# Patient Record
Sex: Female | Born: 1965 | Race: White | Hispanic: No | Smoking: Former smoker
Health system: Southern US, Community
[De-identification: ages and names within clinical notes are randomized; demographics above are authoritative.]

## PROBLEM LIST (undated history)

## (undated) DIAGNOSIS — Z803 Family history of malignant neoplasm of breast: Secondary | ICD-10-CM

## (undated) DIAGNOSIS — I2699 Other pulmonary embolism without acute cor pulmonale: Secondary | ICD-10-CM

## (undated) DIAGNOSIS — C50919 Malignant neoplasm of unspecified site of unspecified female breast: Secondary | ICD-10-CM

## (undated) HISTORY — DX: Malignant neoplasm of unspecified site of unspecified female breast: C50.919

## (undated) HISTORY — DX: Family history of malignant neoplasm of breast: Z80.3

## (undated) HISTORY — DX: Other pulmonary embolism without acute cor pulmonale: I26.99

---

## 1997-09-08 HISTORY — PX: OTHER SURGICAL HISTORY: SHX169

## 1998-07-03 ENCOUNTER — Other Ambulatory Visit: Admission: RE | Admit: 1998-07-03 | Discharge: 1998-07-03 | Payer: Self-pay | Admitting: Obstetrics and Gynecology

## 1999-07-08 ENCOUNTER — Other Ambulatory Visit: Admission: RE | Admit: 1999-07-08 | Discharge: 1999-07-08 | Payer: Self-pay | Admitting: Obstetrics and Gynecology

## 2000-09-21 ENCOUNTER — Other Ambulatory Visit: Admission: RE | Admit: 2000-09-21 | Discharge: 2000-09-21 | Payer: Self-pay | Admitting: Obstetrics and Gynecology

## 2001-09-23 ENCOUNTER — Other Ambulatory Visit: Admission: RE | Admit: 2001-09-23 | Discharge: 2001-09-23 | Payer: Self-pay | Admitting: Obstetrics and Gynecology

## 2002-09-30 ENCOUNTER — Other Ambulatory Visit: Admission: RE | Admit: 2002-09-30 | Discharge: 2002-09-30 | Payer: Self-pay | Admitting: Obstetrics and Gynecology

## 2004-02-02 ENCOUNTER — Other Ambulatory Visit: Admission: RE | Admit: 2004-02-02 | Discharge: 2004-02-02 | Payer: Self-pay | Admitting: Obstetrics and Gynecology

## 2005-02-14 ENCOUNTER — Other Ambulatory Visit: Admission: RE | Admit: 2005-02-14 | Discharge: 2005-02-14 | Payer: Self-pay | Admitting: Obstetrics and Gynecology

## 2006-04-10 ENCOUNTER — Other Ambulatory Visit: Admission: RE | Admit: 2006-04-10 | Discharge: 2006-04-10 | Payer: Self-pay | Admitting: Obstetrics and Gynecology

## 2007-03-11 ENCOUNTER — Other Ambulatory Visit: Admission: RE | Admit: 2007-03-11 | Discharge: 2007-03-11 | Payer: Self-pay | Admitting: Obstetrics and Gynecology

## 2009-03-16 ENCOUNTER — Other Ambulatory Visit: Admission: RE | Admit: 2009-03-16 | Discharge: 2009-03-16 | Payer: Self-pay | Admitting: Obstetrics and Gynecology

## 2009-03-16 ENCOUNTER — Ambulatory Visit: Payer: Self-pay | Admitting: Obstetrics and Gynecology

## 2009-03-16 ENCOUNTER — Encounter: Payer: Self-pay | Admitting: Obstetrics and Gynecology

## 2009-05-11 ENCOUNTER — Encounter: Admission: RE | Admit: 2009-05-11 | Discharge: 2009-05-11 | Payer: Self-pay | Admitting: Obstetrics and Gynecology

## 2009-05-18 ENCOUNTER — Encounter: Admission: RE | Admit: 2009-05-18 | Discharge: 2009-05-18 | Payer: Self-pay | Admitting: Obstetrics and Gynecology

## 2010-05-07 ENCOUNTER — Other Ambulatory Visit: Admission: RE | Admit: 2010-05-07 | Discharge: 2010-05-07 | Payer: Self-pay | Admitting: Obstetrics and Gynecology

## 2010-05-07 ENCOUNTER — Ambulatory Visit: Payer: Self-pay | Admitting: Obstetrics and Gynecology

## 2010-05-28 ENCOUNTER — Encounter: Admission: RE | Admit: 2010-05-28 | Discharge: 2010-05-28 | Payer: Self-pay | Admitting: Obstetrics and Gynecology

## 2010-05-28 ENCOUNTER — Ambulatory Visit: Payer: Self-pay | Admitting: Obstetrics and Gynecology

## 2010-06-03 ENCOUNTER — Encounter: Admission: RE | Admit: 2010-06-03 | Discharge: 2010-06-03 | Payer: Self-pay | Admitting: Obstetrics and Gynecology

## 2011-05-27 ENCOUNTER — Other Ambulatory Visit: Payer: Self-pay | Admitting: Obstetrics and Gynecology

## 2011-05-27 DIAGNOSIS — Z1231 Encounter for screening mammogram for malignant neoplasm of breast: Secondary | ICD-10-CM

## 2011-06-25 ENCOUNTER — Encounter: Payer: Self-pay | Admitting: Gynecology

## 2011-07-02 ENCOUNTER — Ambulatory Visit
Admission: RE | Admit: 2011-07-02 | Discharge: 2011-07-02 | Disposition: A | Discharge status: Home / Self Care | Payer: No Typology Code available for payment source | Source: Ambulatory Visit | Attending: Obstetrics and Gynecology | Admitting: Obstetrics and Gynecology

## 2011-07-02 DIAGNOSIS — Z1231 Encounter for screening mammogram for malignant neoplasm of breast: Secondary | ICD-10-CM

## 2011-07-03 ENCOUNTER — Encounter: Payer: Self-pay | Admitting: Obstetrics and Gynecology

## 2011-07-03 ENCOUNTER — Other Ambulatory Visit (HOSPITAL_COMMUNITY)
Admission: RE | Admit: 2011-07-03 | Discharge: 2011-07-03 | Disposition: A | Discharge status: Home / Self Care | Payer: No Typology Code available for payment source | Source: Ambulatory Visit | Attending: Obstetrics and Gynecology | Admitting: Obstetrics and Gynecology

## 2011-07-03 ENCOUNTER — Ambulatory Visit (INDEPENDENT_AMBULATORY_CARE_PROVIDER_SITE_OTHER): Payer: No Typology Code available for payment source | Admitting: Obstetrics and Gynecology

## 2011-07-03 VITALS — BP 120/74 | Ht 63.5 in | Wt 188.0 lb

## 2011-07-03 DIAGNOSIS — R635 Abnormal weight gain: Secondary | ICD-10-CM

## 2011-07-03 DIAGNOSIS — Z833 Family history of diabetes mellitus: Secondary | ICD-10-CM

## 2011-07-03 DIAGNOSIS — Z01419 Encounter for gynecological examination (general) (routine) without abnormal findings: Secondary | ICD-10-CM | POA: Insufficient documentation

## 2011-07-03 NOTE — Progress Notes (Signed)
Patient came to see me today for her annual GYN exam. She has been eating much better and exercising much more. So far it's been discouraging to her because  she actually weighed 10 pounds more than she did last year. What has improved is her periods. Since she started the exercise program her periods are lighter and she does not have any dysfunctional bleeding. She has been on this new program since the summer. She is up-to-date on her mammograms having just had one yesterday. She is contraceptive by condoms. She is having no pelvic pain.  ROS: Only pertinent positives above.  Physical examination: Kennon Portela present. HEENT within normal limits. Neck: Thyroid not large. No masses. Supraclavicular nodes: not enlarged. Breasts: Examined in both sitting midline position. No skin changes and no masses. Abdomen: Soft no guarding rebound or masses or hernia. Pelvic: External: Within normal limits. BUS: Within normal limits. Vaginal:within normal limits. Good estrogen effect. No evidence of cystocele rectocele or enterocele. Cervix: clean. Uterus: Normal size and shape. Adnexa: No masses. Rectovaginal exam: Confirmatory and negative. Extremities: Within normal limits.  Assessment: Weight gain. Improvement in menorrhagia.  Plan: Continue with exercise and diet. Reduce carbohydrates. Appropriate lab drawn. If weight gain continues nutritional consult.

## 2011-07-17 ENCOUNTER — Telehealth: Payer: Self-pay

## 2011-07-17 NOTE — Telephone Encounter (Signed)
Pt. Notified by voicemail all labs ok except hemoglobin 11.8 and best at 12 or above and to take a womens multivitamin with extra iron to keep level normal and that Dr. Reece Agar wanted to only do a nutritional referral if she continued to have weight gain.

## 2012-07-07 ENCOUNTER — Ambulatory Visit (INDEPENDENT_AMBULATORY_CARE_PROVIDER_SITE_OTHER): Payer: No Typology Code available for payment source | Admitting: Obstetrics and Gynecology

## 2012-07-07 ENCOUNTER — Encounter: Payer: Self-pay | Admitting: Obstetrics and Gynecology

## 2012-07-07 VITALS — BP 120/76 | Ht 63.0 in | Wt 180.0 lb

## 2012-07-07 DIAGNOSIS — N912 Amenorrhea, unspecified: Secondary | ICD-10-CM

## 2012-07-07 DIAGNOSIS — Z01419 Encounter for gynecological examination (general) (routine) without abnormal findings: Secondary | ICD-10-CM

## 2012-07-07 LAB — CBC WITH DIFFERENTIAL/PLATELET
Basophils Absolute: 0 10*3/uL (ref 0.0–0.1)
Eosinophils Relative: 3 % (ref 0–5)
Lymphocytes Relative: 15 % (ref 12–46)
MCV: 84.2 fL (ref 78.0–100.0)
Platelets: 390 10*3/uL (ref 150–400)
RDW: 12.5 % (ref 11.5–15.5)
WBC: 9.3 10*3/uL (ref 4.0–10.5)

## 2012-07-07 NOTE — Progress Notes (Signed)
Patient came to see me today for her annual GYN exam. She has regular cycles. She is heavy for one and a half days. We have previously discussed a Mirena IUD but she does not want to do that. She contracepts  with condom. Her last cycle was lighter than normal. It also came at a slightly different time. It was about 2 weeks ago. She is due for her mammogram. She has always had normal Pap smears. Her last Pap was 2012. She is having no pelvic pain. She does not bleed between her periods.  Physical examination: HEENT within normal limits. Neck: Thyroid not large. No masses. Supraclavicular nodes: not enlarged. Breasts: Examined in both sitting and lying  position. No skin changes and no masses. Abdomen: Soft no guarding rebound or masses or hernia. Pelvic: External: Within normal limits. BUS: Within normal limits. Vaginal:within normal limits. Good estrogen effect. No evidence of cystocele rectocele or enterocele. Cervix: clean. Uterus: Normal size and shape. Adnexa: No masses. Rectovaginal exam: Confirmatory and negative. Extremities: Within normal limits.   Assessment: Normal GYN exam.  Plan: Qualitative hCG since last cycle was light. Mammogram. Pap not done.The new Pap smear guidelines were discussed with the patient.

## 2012-07-07 NOTE — Patient Instructions (Signed)
Schedule mammogram.

## 2012-07-08 LAB — URINALYSIS W MICROSCOPIC + REFLEX CULTURE
Bacteria, UA: NONE SEEN
Bilirubin Urine: NEGATIVE
Casts: NONE SEEN
Glucose, UA: NEGATIVE mg/dL
Hgb urine dipstick: NEGATIVE
Ketones, ur: NEGATIVE mg/dL
Leukocytes, UA: NEGATIVE
pH: 6.5 (ref 5.0–8.0)

## 2012-07-08 LAB — HCG, SERUM, QUALITATIVE: Preg, Serum: NEGATIVE

## 2012-07-09 ENCOUNTER — Encounter: Payer: Self-pay | Admitting: Obstetrics and Gynecology

## 2012-07-09 ENCOUNTER — Other Ambulatory Visit: Payer: Self-pay | Admitting: Obstetrics and Gynecology

## 2012-07-09 DIAGNOSIS — Z1231 Encounter for screening mammogram for malignant neoplasm of breast: Secondary | ICD-10-CM

## 2012-07-09 DIAGNOSIS — D649 Anemia, unspecified: Secondary | ICD-10-CM

## 2012-07-27 ENCOUNTER — Ambulatory Visit
Admission: RE | Admit: 2012-07-27 | Discharge: 2012-07-27 | Disposition: A | Discharge status: Home / Self Care | Payer: No Typology Code available for payment source | Source: Ambulatory Visit | Attending: Obstetrics and Gynecology | Admitting: Obstetrics and Gynecology

## 2012-07-27 DIAGNOSIS — Z1231 Encounter for screening mammogram for malignant neoplasm of breast: Secondary | ICD-10-CM

## 2012-10-23 ENCOUNTER — Other Ambulatory Visit: Payer: Self-pay

## 2013-07-13 ENCOUNTER — Other Ambulatory Visit: Payer: Self-pay

## 2013-07-13 DIAGNOSIS — Z1231 Encounter for screening mammogram for malignant neoplasm of breast: Secondary | ICD-10-CM

## 2013-07-14 ENCOUNTER — Other Ambulatory Visit: Payer: Self-pay

## 2013-07-29 ENCOUNTER — Ambulatory Visit (INDEPENDENT_AMBULATORY_CARE_PROVIDER_SITE_OTHER): Payer: No Typology Code available for payment source | Admitting: Gynecology

## 2013-07-29 ENCOUNTER — Encounter: Payer: Self-pay | Admitting: Gynecology

## 2013-07-29 VITALS — BP 126/78 | Ht 64.5 in | Wt 188.0 lb

## 2013-07-29 DIAGNOSIS — Z833 Family history of diabetes mellitus: Secondary | ICD-10-CM | POA: Insufficient documentation

## 2013-07-29 DIAGNOSIS — R635 Abnormal weight gain: Secondary | ICD-10-CM

## 2013-07-29 DIAGNOSIS — Z01419 Encounter for gynecological examination (general) (routine) without abnormal findings: Secondary | ICD-10-CM

## 2013-07-29 LAB — COMPREHENSIVE METABOLIC PANEL
AST: 21 U/L (ref 0–37)
Alkaline Phosphatase: 47 U/L (ref 39–117)
BUN: 8 mg/dL (ref 6–23)
Creat: 0.7 mg/dL (ref 0.50–1.10)

## 2013-07-29 LAB — CBC WITH DIFFERENTIAL/PLATELET
Basophils Absolute: 0 10*3/uL (ref 0.0–0.1)
Basophils Relative: 0 % (ref 0–1)
Eosinophils Absolute: 0.2 10*3/uL (ref 0.0–0.7)
Eosinophils Relative: 2 % (ref 0–5)
HCT: 33.5 % — ABNORMAL LOW (ref 36.0–46.0)
MCH: 29.1 pg (ref 26.0–34.0)
MCHC: 34.6 g/dL (ref 30.0–36.0)
MCV: 84.2 fL (ref 78.0–100.0)
Monocytes Absolute: 0.8 10*3/uL (ref 0.1–1.0)
Neutro Abs: 6.7 10*3/uL (ref 1.7–7.7)
RDW: 12.9 % (ref 11.5–15.5)

## 2013-07-29 NOTE — Progress Notes (Signed)
KRYSLYN HELBIG 1965-10-31 [id number]   History:    47 y.o.  for annual gyn exam with no complaints today. Patient is going to an overweight clinic and they currently have her on phentermine A. She is being monitored and check monthly. Review of her record indicated that last year she was weighing 180 pounds and is up to 188 pounds. Patient is using condoms for contraception she states her cycles are usually last 7 days the first 3 days are very heavy. Patient declined flu vaccine today. Her mammogram is due this November. Patient denies any prior history of abnormal Pap smear.  Past medical history,surgical history, family history and social history were all reviewed and documented in the EPIC chart.  Gynecologic History Patient's last menstrual period was 07/11/2013. Contraception: condoms Last Pap: 2012. Results were: normal Last mammogram: 2013. Results were: since the normal  Obstetric History OB History  Gravida Para Term Preterm AB SAB TAB Ectopic Multiple Living  1 1 1       1     # Outcome Date GA Lbr Len/2nd Weight Sex Delivery Anes PTL Lv  1 TRM                ROS: A ROS was performed and pertinent positives and negatives are included in the history.  GENERAL: No fevers or chills. HEENT: No change in vision, no earache, sore throat or sinus congestion. NECK: No pain or stiffness. CARDIOVASCULAR: No chest pain or pressure. No palpitations. PULMONARY: No shortness of breath, cough or wheeze. GASTROINTESTINAL: No abdominal pain, nausea, vomiting or diarrhea, melena or bright red blood per rectum. GENITOURINARY: No urinary frequency, urgency, hesitancy or dysuria. MUSCULOSKELETAL: No joint or muscle pain, no back pain, no recent trauma. DERMATOLOGIC: No rash, no itching, no lesions. ENDOCRINE: No polyuria, polydipsia, no heat or cold intolerance. No recent change in weight. HEMATOLOGICAL: No anemia or easy bruising or bleeding. NEUROLOGIC: No headache, seizures, numbness,  tingling or weakness. PSYCHIATRIC: No depression, no loss of interest in normal activity or change in sleep pattern.     Exam: chaperone present  BP 126/78  Ht 5' 4.5" (1.638 m)  Wt 188 lb (85.276 kg)  BMI 31.78 kg/m2  LMP 07/11/2013  Body mass index is 31.78 kg/(m^2).  General appearance : Well developed well nourished female. No acute distress HEENT: Neck supple, trachea midline, no carotid bruits, no thyroidmegaly Lungs: Clear to auscultation, no rhonchi or wheezes, or rib retractions  Heart: Regular rate and rhythm, no murmurs or gallops Breast:Examined in sitting and supine position were symmetrical in appearance, no palpable masses or tenderness,  no skin retraction, no nipple inversion, no nipple discharge, no skin discoloration, no axillary or supraclavicular lymphadenopathy Abdomen: no palpable masses or tenderness, no rebound or guarding Extremities: no edema or skin discoloration or tenderness  Pelvic:  Bartholin, Urethra, Skene Glands: Within normal limits             Vagina: No gross lesions or discharge  Cervix: No gross lesions or discharge  Uterus  Slightly retroverted, normal size, shape and consistency, non-tender and mobile  Adnexa  Without masses or tenderness  Anus and perineum  normal   Rectovaginal  normal sphincter tone without palpated masses or tenderness             Hemoccult not indicated     Assessment/Plan:  47 y.o. female for annual exam who suffers at times from heavy cycles. We discussed a Mirena IUD and literature and information was  provided. Patient with strong family history of diabetes. Pap smear was not done today in accordance with the new guidelines. The following labs were ordered: CBC, screen cholesterol, comprehensive metabolic panel, TSH, and urinalysis. We discussed importance of calcium and vitamin D for osteoporosis prevention along with regular exercise.  Note: This dictation was prepared with  Dragon/digital dictation along  withSmart phrase technology. Any transcriptional errors that result from this process are unintentional.   Ok Edwards MD, 2:49 PM 07/29/2013

## 2013-07-29 NOTE — Patient Instructions (Signed)
Intrauterine Device Information An intrauterine device (IUD) is inserted into your uterus to prevent pregnancy. There are two types of IUDs available:   Copper IUD This type of IUD is wrapped in copper wire and is placed inside the uterus. Copper makes the uterus and fallopian tubes produce a fluid that kills sperm. The copper IUD can stay in place for 10 years.  Hormone IUD This type of IUD contains the hormone progestin (synthetic progesterone). The hormone thickens the cervical mucus and prevents sperm from entering the uterus. It also thins the uterine lining to prevent implantation of a fertilized egg. The hormone can weaken or kill the sperm that get into the uterus. One type of hormone IUD can stay in place for 5 years, and another type can stay in place for 3 years. Your health care provider will make sure you are a good candidate for a contraceptive IUD. Discuss with your health care provider the possible side effects.  ADVANTAGES OF AN INTRAUTERINE DEVICE  IUDs are highly effective, reversible, long acting, and low maintenance.   There are no estrogen-related side effects.   An IUD can be used when breastfeeding.   IUDs are not associated with weight gain.   The copper IUD works immediately after insertion.   The hormone IUD works right away if inserted within 7 days of your period starting. You will need to use a backup method of birth control for 7 days if the hormone IUD is inserted at any other time in your cycle.  The copper IUD does not interfere with your female hormones.   The hormone IUD can make heavy menstrual periods lighter and decrease cramping.   The hormone IUD can be used for 3 or 5 years.   The copper IUD can be used for 10 years. DISADVANTAGES OF AN INTRAUTERINE DEVICE  The hormone IUD can be associated with irregular bleeding patterns.   The copper IUD can make your menstrual flow heavier and more painful.   You may experience cramping and  vaginal bleeding after insertion.  Document Released: 07/29/2004 Document Revised: 04/27/2013 Document Reviewed: 02/13/2013 [employer] Patient Information 2014 [employer].  

## 2013-07-30 LAB — URINALYSIS W MICROSCOPIC + REFLEX CULTURE
Casts: NONE SEEN
Glucose, UA: NEGATIVE mg/dL
Leukocytes, UA: NEGATIVE
Nitrite: NEGATIVE
Protein, ur: NEGATIVE mg/dL
Squamous Epithelial / LPF: NONE SEEN
pH: 6.5 (ref 5.0–8.0)

## 2013-07-30 LAB — TSH: TSH: 1.164 u[IU]/mL (ref 0.350–4.500)

## 2013-08-19 ENCOUNTER — Ambulatory Visit
Admission: RE | Admit: 2013-08-19 | Discharge: 2013-08-19 | Disposition: A | Discharge status: Home / Self Care | Payer: No Typology Code available for payment source | Source: Ambulatory Visit

## 2013-08-19 ENCOUNTER — Ambulatory Visit: Payer: No Typology Code available for payment source

## 2013-08-19 DIAGNOSIS — Z1231 Encounter for screening mammogram for malignant neoplasm of breast: Secondary | ICD-10-CM

## 2014-06-08 HISTORY — PX: OTHER SURGICAL HISTORY: SHX169

## 2014-06-14 ENCOUNTER — Ambulatory Visit (INDEPENDENT_AMBULATORY_CARE_PROVIDER_SITE_OTHER): Payer: No Typology Code available for payment source | Admitting: Gynecology

## 2014-06-14 ENCOUNTER — Encounter: Payer: Self-pay | Admitting: Gynecology

## 2014-06-14 VITALS — BP 140/90 | HR 72 | Ht 63.75 in | Wt 198.0 lb

## 2014-06-14 DIAGNOSIS — N938 Other specified abnormal uterine and vaginal bleeding: Secondary | ICD-10-CM

## 2014-06-14 DIAGNOSIS — Z0000 Encounter for general adult medical examination without abnormal findings: Secondary | ICD-10-CM

## 2014-06-14 DIAGNOSIS — Z01419 Encounter for gynecological examination (general) (routine) without abnormal findings: Secondary | ICD-10-CM

## 2014-06-14 DIAGNOSIS — Z124 Encounter for screening for malignant neoplasm of cervix: Secondary | ICD-10-CM

## 2014-06-14 LAB — POCT URINALYSIS DIPSTICK
Bilirubin, UA: NEGATIVE
GLUCOSE UA: NEGATIVE
Ketones, UA: NEGATIVE
Leukocytes, UA: NEGATIVE
NITRITE UA: NEGATIVE
PH UA: 7
Protein, UA: NEGATIVE
UROBILINOGEN UA: NEGATIVE

## 2014-06-14 LAB — CBC
HEMATOCRIT: 33.3 % — AB (ref 36.0–46.0)
HEMOGLOBIN: 11.2 g/dL — AB (ref 12.0–15.0)
MCH: 29.6 pg (ref 26.0–34.0)
MCHC: 33.6 g/dL (ref 30.0–36.0)
MCV: 87.9 fL (ref 78.0–100.0)
Platelets: 340 10*3/uL (ref 150–400)
RBC: 3.79 MIL/uL — ABNORMAL LOW (ref 3.87–5.11)
RDW: 13.8 % (ref 11.5–15.5)
WBC: 9 10*3/uL (ref 4.0–10.5)

## 2014-06-14 LAB — HEMOGLOBIN, FINGERSTICK: Hemoglobin, fingerstick: 11.3 g/dL — ABNORMAL LOW (ref 12.0–16.0)

## 2014-06-14 MED ORDER — NORETHINDRONE ACETATE 5 MG PO TABS: 15.0000 mg | ORAL_TABLET | Freq: Every day | ORAL | Status: DC

## 2014-06-14 NOTE — Progress Notes (Signed)
48 y.o. single Caucasian female   G1P1001 here for annual exam. Pt is currently sexually active. Pt is using condoms for contraception. Pt states that she is on supplemental iron due to heavy menses.  Pt states that normal cycle in June, no cycle in July and August.  Pt started bleeding in September and had been bleeding since, heavy, clots up to plum size.  Never had clots before.  Pt did 2 pregnancy tests-both negative-in July and August.  Pt is changing pad every 3-4h.  Bleeding is variable in flow.  Pt has never missed a cycle before.  She is under a lot of stress.   Pt is still able to exercise. No recent PUS, was offered Mirena IUD but was not interested at the time.  Patient's last menstrual period was 05/09/2014.          Sexually active: Yes.    The current method of family planning is condoms most of the time.    Exercising: Yes.    Home exercise routine includes walking and running. Last pap: 2012, normal Alcohol:socially Tobacco: no BSE:  No Mammogram: 08/23/13 Bi-Rads Neg    Health Maintenance  Topic Date Due  . Tetanus/tdap  12/18/1986  . Influenza Vaccine  04/08/2014  . Pap Smear  07/29/2014    Family History  Problem Relation Age of Onset  . Breast cancer Mother 34    died 20  . Diabetes Father   . Hypertension Father   . Breast cancer Maternal Grandmother 60  . Breast cancer Paternal Grandmother 3    Age 1's  . Breast cancer Paternal Aunt 49    Patient Active Problem List   Diagnosis Date Noted  . Family history of diabetes mellitus 07/29/2013    History reviewed. No pertinent past medical history.  Past Surgical History  Procedure Laterality Date  . Cesarean section    . Broken wrist      Allergies: Penicillins and Sulfa antibiotics  Current Outpatient Prescriptions  Medication Sig Dispense Refill  . Cyanocobalamin (VITAMIN B 12 PO) Take by mouth.      . ferrous sulfate (FER-IN-SOL) 75 (15 FE) MG/ML SOLN Take by mouth.      . Multiple Vitamin  (MULTIVITAMIN) tablet Take 1 tablet by mouth daily.        . naproxen (NAPROSYN) 500 MG tablet Take 500 mg by mouth 2 (two) times daily with a meal.      . traMADol (ULTRAM) 50 MG tablet Take by mouth every 6 (six) hours as needed.      Candace [employer] (ISOMIL/IRON) LIQD Take by mouth daily.       No current facility-administered medications for this visit.    ROS: Pertinent items are noted in HPI.  Exam:    BP 140/90  Pulse 72  Ht 5' 3.75" (1.619 m)  Wt 198 lb (89.812 kg)  BMI 34.26 kg/m2  LMP 05/09/2014 Weight change: @WEIGHTCHANGE @ Last 3 height recordings:  Ht Readings from Last 3 Encounters:  06/14/14 5' 3.75" (1.619 m)  07/29/13 5' 4.5" (1.638 m)  07/07/12 5\' 3"  (1.6 m)   General appearance: alert, cooperative and appears stated age Head: Normocephalic, without obvious abnormality, atraumatic Neck: no adenopathy, no carotid bruit, no JVD, supple, symmetrical, trachea midline and thyroid not enlarged, symmetric, no tenderness/mass/nodules Lungs: clear to auscultation bilaterally Breasts: normal appearance, no masses or tenderness Heart: regular rate and rhythm, S1, S2 normal, no murmur, click, rub or gallop Abdomen: soft, non-tender; bowel sounds normal;  no masses,  no organomegaly Extremities: extremities normal, atraumatic, no cyanosis or edema Skin: Skin color, texture, turgor normal. No rashes or lesions Lymph nodes: Cervical, supraclavicular, and axillary nodes normal. no inguinal nodes palpated Neurologic: Grossly normal   Pelvic: External genitalia:  normal escutcheon              Urethra: normal appearing urethra with no masses, tenderness or lesions              Bartholins and Skenes: Bartholin's, Urethra, Skene's normal                 Vagina: moderate mentrum              Cervix: normal appearance              Pap taken: Yes.          Bimanual Exam:  Uterus:  uterus is normal size, shape, consistency and nontender                                      Adnexa:     no masses                                      Rectovaginal: Confirms                                      Anus:  normal sphincter tone, no lesions       1. Laboratory exam ordered as part of routine general medical examination  - POCT urinalysis dipstick - Hemoglobin, fingerstick  2. Encounter for routine gynecological examination  mammogram pap smear counseled on breast self exam, mammography screening, menopause, adequate intake of calcium and vitamin D, diet and exercise return annually or prn 3. DUB (dysfunctional uterine bleeding) Pt informed re need for EMB to help assess bleeding and guide treatment, consent obtained - Tissue Biopsy - norethindrone (AYGESTIN) 5 MG tablet; Take 3 tablets (15 mg total) by mouth daily. Until bleeding stops, 2 tab for 1w, 1 tab for 10d  Dispense: 30 tablet; Refill: 1 - CBC - US Transvaginal Non-OB; Future  4. Screening for cervical cancer Guidelines reviewed - Pap Test with HP (IPS)  An After Visit Summary was printed and given to the patient.  Endometrial biopsy: Consent obtained. Speculum placed, cervix cleansed with betadine and xylocaine jelly placed, os noted to be slightly open.  milex pipelle advanced without resistance, uterus sounded to 7.  Moderate amount of blood and tissue obtained on single pass. Pt tolerated well Tissue to pathology

## 2014-06-16 LAB — IPS OTHER TISSUE BIOPSY

## 2014-06-16 MED ORDER — MEDROXYPROGESTERONE ACETATE 10 MG PO TABS: 10.0000 mg | ORAL_TABLET | Freq: Every day | ORAL | Status: DC

## 2014-06-16 NOTE — Addendum Note (Signed)
Addended by: Elveria Rising on: 06/16/2014 10:48 AM   Modules accepted: Orders

## 2014-06-19 ENCOUNTER — Telehealth: Payer: Self-pay

## 2014-06-19 ENCOUNTER — Telehealth: Payer: Self-pay | Admitting: Gynecology

## 2014-06-19 NOTE — Telephone Encounter (Signed)
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

## 2014-06-19 NOTE — Telephone Encounter (Signed)
Her biopsy was disordered which would go with the bleeding pattern she had, she can hold off and see how she does over the next few months and repeat bx

## 2014-06-19 NOTE — Telephone Encounter (Signed)
Spoke with patient. Advised that per benefit quote received, she will be responsible for $465.96 when she comes in for PUS. Patient agreeable.

## 2014-06-19 NOTE — Telephone Encounter (Signed)
Spoke with patient. Advised of message as seen below. Patient is agreeable and verbalizes understanding.   Routing to provider for final review. Patient agreeable to disposition. Will close encounter

## 2014-06-19 NOTE — Telephone Encounter (Signed)
Spoke with patient. Advised of message as seen below from Dr.Lathrop. Patient is agreeable and verbalizes understanding. Will monitor bleeding on Aygestin and return call if any concerns, questions, or if she would like to schedule PUS.  Routing to provider for final review. Patient agreeable to disposition. Will close encounter

## 2014-06-21 LAB — IPS PAP TEST WITH HPV

## 2014-07-10 ENCOUNTER — Encounter: Payer: Self-pay | Admitting: Gynecology

## 2014-07-14 ENCOUNTER — Other Ambulatory Visit: Payer: Self-pay

## 2014-07-14 DIAGNOSIS — Z1231 Encounter for screening mammogram for malignant neoplasm of breast: Secondary | ICD-10-CM

## 2014-08-25 ENCOUNTER — Ambulatory Visit
Admission: RE | Admit: 2014-08-25 | Discharge: 2014-08-25 | Disposition: A | Discharge status: Home / Self Care | Payer: No Typology Code available for payment source | Source: Ambulatory Visit

## 2014-08-25 DIAGNOSIS — Z1231 Encounter for screening mammogram for malignant neoplasm of breast: Secondary | ICD-10-CM

## 2014-11-15 ENCOUNTER — Telehealth: Payer: Self-pay

## 2014-11-15 NOTE — Telephone Encounter (Signed)
Left message to call Kaitlyn at [phone]. 

## 2014-11-15 NOTE — Telephone Encounter (Signed)
-----   Message from Jennifer Salon, MD sent at 11/13/2014  9:40 PM EST ----- I would see how pt's bleeding is right now.  If still heavy, she had a biopsy in 10/15 that was negative so that does not need to be repeated.  However, a PUS would be helpful.  Thanks.  MSM ----- Message -----    From: Jasmine Awe, RN    Sent: 11/06/2014  12:17 PM      To: Lyman Speller, MD  Dr. Sabra Heck,  This patient was previously seen with Dr.Lathrop for EMB due to DUB. Last result note and recommendation from Dr.Lathrop copied below. Please review and advise follow up for this patient. Does she need another EMB at this time?  Jasmine Awe, RN at 06/19/2014 12:13 PM    Status: Signed      Expand All Collapse All    Spoke with patient. Advised of message as seen below. Patient is agreeable and verbalizes understanding.   Routing to provider for final review. Patient agreeable to disposition. 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

## 2014-11-16 NOTE — Telephone Encounter (Signed)
I do not have any other recommendations for her unless bleeding becomes irregular again or she has heavy bleeding.  AEX due in fall.  Ok to close encounter.  THanks for calling her.

## 2014-11-16 NOTE — Telephone Encounter (Signed)
Spoke with patient. Patient states "I took the provera for ten days in October and that started my cycle. Ever since then my cycles have been regular. I have not had any heavy bleeding. I have one cycle a month now. It is like it jump started me back to normal. Everything is great." Advised patient would let Dr.Miller know and if cycles become irregular again or heavy at any point to call and let Dr.Miller know. Patient is agreeable and verbalizes understanding.

## 2015-07-25 ENCOUNTER — Other Ambulatory Visit: Payer: Self-pay

## 2015-07-25 DIAGNOSIS — Z1231 Encounter for screening mammogram for malignant neoplasm of breast: Secondary | ICD-10-CM

## 2015-07-27 ENCOUNTER — Encounter: Payer: Self-pay | Admitting: Certified Nurse Midwife

## 2015-07-27 ENCOUNTER — Ambulatory Visit (INDEPENDENT_AMBULATORY_CARE_PROVIDER_SITE_OTHER): Payer: No Typology Code available for payment source | Admitting: Certified Nurse Midwife

## 2015-07-27 VITALS — BP 118/76 | HR 74 | Resp 16 | Ht 63.5 in | Wt 197.0 lb

## 2015-07-27 DIAGNOSIS — Z0000 Encounter for general adult medical examination without abnormal findings: Secondary | ICD-10-CM | POA: Diagnosis not present

## 2015-07-27 DIAGNOSIS — Z30011 Encounter for initial prescription of contraceptive pills: Secondary | ICD-10-CM | POA: Diagnosis not present

## 2015-07-27 DIAGNOSIS — Z01419 Encounter for gynecological examination (general) (routine) without abnormal findings: Secondary | ICD-10-CM | POA: Diagnosis not present

## 2015-07-27 LAB — LIPID PANEL
Cholesterol: 198 mg/dL (ref 125–200)
HDL: 43 mg/dL — ABNORMAL LOW (ref >=46)
LDL Cholesterol: 127 mg/dL (ref <130)
Total CHOL/HDL Ratio: 4.6 Ratio (ref <=5.0)
Triglycerides: 140 mg/dL (ref <150)
VLDL: 28 mg/dL (ref <30)

## 2015-07-27 LAB — COMPREHENSIVE METABOLIC PANEL
ALBUMIN: 4.6 g/dL (ref 3.6–5.1)
ALT: 51 U/L — ABNORMAL HIGH (ref 6–29)
AST: 29 U/L (ref 10–35)
Alkaline Phosphatase: 61 U/L (ref 33–115)
BILIRUBIN TOTAL: 0.3 mg/dL (ref 0.2–1.2)
BUN: 12 mg/dL (ref 7–25)
CO2: 27 mmol/L (ref 20–31)
CREATININE: 0.68 mg/dL (ref 0.50–1.10)
Calcium: 9.7 mg/dL (ref 8.6–10.2)
Chloride: 101 mmol/L (ref 98–110)
Glucose, Bld: 72 mg/dL (ref 65–99)
Potassium: 4.3 mmol/L (ref 3.5–5.3)
SODIUM: 137 mmol/L (ref 135–146)
TOTAL PROTEIN: 7 g/dL (ref 6.1–8.1)

## 2015-07-27 LAB — POCT URINALYSIS DIPSTICK
Bilirubin, UA: NEGATIVE
Glucose, UA: NEGATIVE
Ketones, UA: NEGATIVE
LEUKOCYTES UA: NEGATIVE
Nitrite, UA: NEGATIVE
PH UA: 5
Protein, UA: NEGATIVE
RBC UA: NEGATIVE
UROBILINOGEN UA: NEGATIVE

## 2015-07-27 LAB — CBC
HCT: 34.2 % — ABNORMAL LOW (ref 36.0–46.0)
Hemoglobin: 11.7 g/dL — ABNORMAL LOW (ref 12.0–15.0)
MCH: 29.7 pg (ref 26.0–34.0)
MCHC: 34.2 g/dL (ref 30.0–36.0)
MCV: 86.8 fL (ref 78.0–100.0)
MPV: 10 fL (ref 8.6–12.4)
PLATELETS: 370 10*3/uL (ref 150–400)
RBC: 3.94 MIL/uL (ref 3.87–5.11)
RDW: 13.5 % (ref 11.5–15.5)
WBC: 11.5 10*3/uL — AB (ref 4.0–10.5)

## 2015-07-27 LAB — TSH: TSH: 1.868 u[IU]/mL (ref 0.350–4.500)

## 2015-07-27 MED ORDER — NORETHIN ACE-ETH ESTRAD-FE 1-20 MG-MCG(24) PO TABS: 1.0000 | ORAL_TABLET | Freq: Every day | ORAL | Status: DC

## 2015-07-27 NOTE — Progress Notes (Signed)
49 y.o. G1P1001 divorced Caucasian Fe here for annual exam. Periods are monthly with heavy day 1-3 with cramping then light until ends. No issues. Contraception condoms desires change to [facility], has used in past with no problems. Sees Urgent care if problems. Desires screening labs today.Marland KitchenSexually active one partner, no STD screening needed.No other health issues today.   Patient's last menstrual period was 07/10/2015 (exact date).          Sexually active: Yes.    The current method of family planning is condoms all the time.    Exercising: Yes.    walking & running Smoker:  no  Health Maintenance: Pap: 06-14-14 neg HPV HR neg no abnormals MMG:  08-25-14 category c density,birads 1:neg Colonoscopy:  none BMD:   none TDaP:  Update Labs: poct urine-neg Self breast exam: done montly   reports that she quit smoking about 21 years ago. She does not have any smokeless tobacco history on file. She reports that she drinks alcohol. She reports that she does not use illicit drugs.  History reviewed. No pertinent past medical history.  Past Surgical History  Procedure Laterality Date  . Cesarean section    . Broken wrist      Current Outpatient Prescriptions  Medication Sig Dispense Refill  . Multiple Vitamin (MULTIVITAMIN) tablet Take 1 tablet by mouth daily.       No current facility-administered medications for this visit.    Family History  Problem Relation Age of Onset  . Breast cancer Mother 47    died 33  . Diabetes Father   . Hypertension Father   . Breast cancer Maternal Grandmother 60  . Breast cancer Paternal Grandmother 64    Age 70's  . Breast cancer Paternal Aunt 68    ROS:  Pertinent items are noted in HPI.  Otherwise, a comprehensive ROS was negative.  Exam:   BP 118/76 mmHg  Pulse 74  Resp 16  Ht 5' 3.5" (1.613 m)  Wt 197 lb (89.359 kg)  BMI 34.35 kg/m2  LMP 07/10/2015 (Exact Date) Height: 5' 3.5" (161.3 cm) Ht Readings from Last 3 Encounters:  07/27/15 5'  3.5" (1.613 m)  06/14/14 5' 3.75" (1.619 m)  07/29/13 5' 4.5" (1.638 m)    General appearance: alert, cooperative and appears stated age Head: Normocephalic, without obvious abnormality, atraumatic Neck: no adenopathy, supple, symmetrical, trachea midline and thyroid normal to inspection and palpation Lungs: clear to auscultation bilaterally Breasts: normal appearance, no masses or tenderness, No nipple retraction or dimpling, No nipple discharge or bleeding, No axillary or supraclavicular adenopathy Heart: regular rate and rhythm Abdomen: soft, non-tender; no masses,  no organomegaly Extremities: extremities normal, atraumatic, no cyanosis or edema Skin: Skin color, texture, turgor normal. No rashes or lesions Lymph nodes: Cervical, supraclavicular, and axillary nodes normal. No abnormal inguinal nodes palpated Neurologic: Grossly normal   Pelvic: External genitalia:  no lesions              Urethra:  normal appearing urethra with no masses, tenderness or lesions              Bartholin's and Skene's: normal                 Vagina: normal appearing vagina with normal color and discharge, no lesions              Cervix: normal,non tender, no lesions              Pap taken: No. Bimanual  Exam:  Uterus:  normal size, contour, position, consistency, mobility, non-tender              Adnexa: normal adnexa and no mass, fullness, tenderness               Rectovaginal: Confirms               Anus:  normal sphincter tone, no lesions  Chaperone present: yes  A:  Well Woman with normal exam  Contraception condoms desires OCP again  Screening labs  Family history of breast cancer( mother 61, MGM 35, PGM 67, PA 60) Mother had BRACA screening which was negative. Patient aware of genetic screening went with mother and was assessed(no blood work) declines further screening at this time.  P:   Reviewed health and wellness pertinent to exam  Discussed risks/benefits of OCP and bleeding profile  expectations. Instructions given to start first day of menses.Warning signs of OCP given and need to advise if occurs. Re check in 3 months of use.  Rx Loestrin 24 Fe see order  Labs: Lipid panel , CMP,TSH, Vitamin D, CBC  Stressed importance of regular SBE and yearly mammograms with 3 D. Discussed still has increased risk with paternal side also. If decides she wants to pursue screening advise.  Pap smear as above not taken   counseled on breast self exam, mammography screening, STD prevention, HIV risk factors and prevention, use and side effects of OCP's, adequate intake of calcium and vitamin D, diet and exercise  return annually or prn  An After Visit Summary was printed and given to the patient.

## 2015-07-27 NOTE — Patient Instructions (Signed)
EXERCISE AND DIET:  We recommended that you start or continue a regular exercise program for good health. Regular exercise means any activity that makes your heart beat faster and makes you sweat.  We recommend exercising at least 30 minutes per day at least 3 days a week, preferably 4 or 5.  We also recommend a diet low in fat and sugar.  Inactivity, poor dietary choices and obesity can cause diabetes, heart attack, stroke, and kidney damage, among others.    ALCOHOL AND SMOKING:  Women should limit their alcohol intake to no more than 7 drinks/beers/glasses of wine (combined, not each!) per week. Moderation of alcohol intake to this level decreases your risk of breast cancer and liver damage. And of course, no recreational drugs are part of a healthy lifestyle.  And absolutely no smoking or even second hand smoke. Most people know smoking can cause heart and lung diseases, but did you know it also contributes to weakening of your bones? Aging of your skin?  Yellowing of your teeth and nails?  CALCIUM AND VITAMIN D:  Adequate intake of calcium and Vitamin D are recommended.  The recommendations for exact amounts of these supplements seem to change often, but generally speaking 600 mg of calcium (either carbonate or citrate) and 800 units of Vitamin D per day seems prudent. Certain women may benefit from higher intake of Vitamin D.  If you are among these women, your doctor will have told you during your visit.    PAP SMEARS:  Pap smears, to check for cervical cancer or precancers,  have traditionally been done yearly, although recent scientific advances have shown that most women can have pap smears less often.  However, every woman still should have a physical exam from her gynecologist every year. It will include a breast check, inspection of the vulva and vagina to check for abnormal growths or skin changes, a visual exam of the cervix, and then an exam to evaluate the size and shape of the uterus and  ovaries.  And after 47 years of age, a rectal exam is indicated to check for rectal cancers. We will also provide age appropriate advice regarding health maintenance, like when you should have certain vaccines, screening for sexually transmitted diseases, bone density testing, colonoscopy, mammograms, etc.   MAMMOGRAMS:  All women over 40 years old should have a yearly mammogram. Many facilities now offer a "3D" mammogram, which may cost around $50 extra out of pocket. If possible,  we recommend you accept the option to have the 3D mammogram performed.  It both reduces the number of women who will be called back for extra views which then turn out to be normal, and it is better than the routine mammogram at detecting truly abnormal areas.    COLONOSCOPY:  Colonoscopy to screen for colon cancer is recommended for all women at age 50.  We know, you hate the idea of the prep.  We agree, BUT, having colon cancer and not knowing it is worse!!  Colon cancer so often starts as a polyp that can be seen and removed at colonscopy, which can quite literally save your life!  And if your first colonoscopy is normal and you have no family history of colon cancer, most women don't have to have it again for 10 years.  Once every ten years, you can do something that may end up saving your life, right?  We will be happy to help you get it scheduled when you are ready.    Be sure to check your insurance coverage so you understand how much it will cost.  It may be covered as a preventative service at no cost, but you should check your particular policy.     Oral Contraception Use Oral contraceptive pills (OCPs) are medicines taken to prevent pregnancy. OCPs work by preventing the ovaries from releasing eggs. The hormones in OCPs also cause the cervical mucus to thicken, preventing the sperm from entering the uterus. The hormones also cause the uterine lining to become thin, not allowing a fertilized egg to attach to the inside of  the uterus. OCPs are highly effective when taken exactly as prescribed. However, OCPs do not prevent sexually transmitted diseases (STDs). Safe sex practices, such as using condoms along with an OCP, can help prevent STDs. Before taking OCPs, you may have a physical exam and Pap test. Your health care provider may also order blood tests if necessary. Your health care provider will make sure you are a good candidate for oral contraception. Discuss with your health care provider the possible side effects of the OCP you may be prescribed. When starting an OCP, it can take 2 to 3 months for the body to adjust to the changes in hormone levels in your body.  HOW TO TAKE ORAL CONTRACEPTIVE PILLS Your health care provider may advise you on how to start taking the first cycle of OCPs. Otherwise, you can:   Start on day 1 of your menstrual period. You will not need any backup contraceptive protection with this start time.   Start on the first Sunday after your menstrual period or the day you get your prescription. In these cases, you will need to use backup contraceptive protection for the first week.   Start the pill at any time of your cycle. If you take the pill within 5 days of the start of your period, you are protected against pregnancy right away. In this case, you will not need a backup form of birth control. If you start at any other time of your menstrual cycle, you will need to use another form of birth control for 7 days. If your OCP is the type called a minipill, it will protect you from pregnancy after taking it for 2 days (48 hours). After you have started taking OCPs:   If you forget to take 1 pill, take it as soon as you remember. Take the next pill at the regular time.   If you miss 2 or more pills, call your health care provider because different pills have different instructions for missed doses. Use backup birth control until your next menstrual period starts.   If you use a 28-day  pack that contains inactive pills and you miss 1 of the last 7 pills (pills with no hormones), it will not matter. Throw away the rest of the non-hormone pills and start a new pill pack.  No matter which day you start the OCP, you will always start a new pack on that same day of the week. Have an extra pack of OCPs and a backup contraceptive method available in case you miss some pills or lose your OCP pack.  HOME CARE INSTRUCTIONS   Do not smoke.   Always use a condom to protect against STDs. OCPs do not protect against STDs.   Use a calendar to mark your menstrual period days.   Read the information and directions that came with your OCP. Talk to your health care provider if you have questions.  SEEK MEDICAL CARE IF:   You develop nausea and vomiting.   You have abnormal vaginal discharge or bleeding.   You develop a rash.   You miss your menstrual period.   You are losing your hair.   You need treatment for mood swings or depression.   You get dizzy when taking the OCP.   You develop acne from taking the OCP.   You become pregnant.  SEEK IMMEDIATE MEDICAL CARE IF:   You develop chest pain.   You develop shortness of breath.   You have an uncontrolled or severe headache.   You develop numbness or slurred speech.   You develop visual problems.   You develop pain, redness, and swelling in the legs.    This information is not intended to replace advice given to you by your health care provider. Make sure you discuss any questions you have with your health care provider.   Document Released: 08/14/2011 Document Revised: 09/15/2014 Document Reviewed: 02/13/2013 Elsevier Interactive Patient Education [employer].

## 2015-07-28 LAB — VITAMIN D 25 HYDROXY (VIT D DEFICIENCY, FRACTURES): Vit D, 25-Hydroxy: 29 ng/mL — ABNORMAL LOW (ref 30–100)

## 2015-07-29 NOTE — Progress Notes (Signed)
Reviewed personally.  M. Suzanne Kshawn Canal, MD.  

## 2015-07-30 LAB — HEMOGLOBIN, FINGERSTICK: Hemoglobin, fingerstick: 11.8 g/dL — ABNORMAL LOW (ref 12.0–16.0)

## 2015-07-31 ENCOUNTER — Other Ambulatory Visit: Payer: Self-pay | Admitting: Certified Nurse Midwife

## 2015-07-31 DIAGNOSIS — R899 Unspecified abnormal finding in specimens from other organs, systems and tissues: Secondary | ICD-10-CM

## 2015-08-01 ENCOUNTER — Telehealth: Payer: Self-pay | Admitting: Certified Nurse Midwife

## 2015-08-01 NOTE — Telephone Encounter (Signed)
Patient said she spoke with joy and she had another question about her results.  [phone]

## 2015-08-07 NOTE — Telephone Encounter (Signed)
Spoke to patient. Pt wanted me to go over her labwork again.

## 2015-08-15 ENCOUNTER — Other Ambulatory Visit (INDEPENDENT_AMBULATORY_CARE_PROVIDER_SITE_OTHER): Payer: No Typology Code available for payment source

## 2015-08-15 DIAGNOSIS — R899 Unspecified abnormal finding in specimens from other organs, systems and tissues: Secondary | ICD-10-CM

## 2015-08-15 LAB — CBC
HEMATOCRIT: 33.8 % — AB (ref 36.0–46.0)
Hemoglobin: 11.4 g/dL — ABNORMAL LOW (ref 12.0–15.0)
MCH: 29.5 pg (ref 26.0–34.0)
MCHC: 33.7 g/dL (ref 30.0–36.0)
MCV: 87.3 fL (ref 78.0–100.0)
MPV: 10.1 fL (ref 8.6–12.4)
Platelets: 373 10*3/uL (ref 150–400)
RBC: 3.87 MIL/uL (ref 3.87–5.11)
RDW: 13.9 % (ref 11.5–15.5)
WBC: 8.5 10*3/uL (ref 4.0–10.5)

## 2015-08-15 LAB — HEPATIC FUNCTION PANEL
ALBUMIN: 4.4 g/dL (ref 3.6–5.1)
ALT: 41 U/L — AB (ref 6–29)
AST: 25 U/L (ref 10–35)
Alkaline Phosphatase: 56 U/L (ref 33–115)
Bilirubin, Direct: 0.1 mg/dL (ref <=0.2)
Indirect Bilirubin: 0.2 mg/dL (ref 0.2–1.2)
TOTAL PROTEIN: 7.1 g/dL (ref 6.1–8.1)
Total Bilirubin: 0.3 mg/dL (ref 0.2–1.2)

## 2015-08-15 LAB — IRON: IRON: 39 ug/dL — AB (ref 40–190)

## 2015-08-16 ENCOUNTER — Telehealth: Payer: Self-pay

## 2015-08-16 ENCOUNTER — Other Ambulatory Visit: Payer: Self-pay | Admitting: Certified Nurse Midwife

## 2015-08-16 DIAGNOSIS — R899 Unspecified abnormal finding in specimens from other organs, systems and tissues: Secondary | ICD-10-CM

## 2015-08-16 LAB — HEPATITIS C ANTIBODY: HCV AB: NEGATIVE

## 2015-08-16 NOTE — Telephone Encounter (Signed)
Spoke with patient. Advised of message as seen below from [place]. Patient is agreeable and verbalizes understanding. One month lab recheck scheduled for 09/19/2015 at 9:30 am.  Notes Recorded by Regina Eck, CNM on 08/16/2015 at 7:48 AM Notify patient that CBC WBC is normal now,  Iron level is 39, normal is 40- 190, just make sure getting iron foods in diet. Recheck one month Hepatic function test all normal, but ALT still elevated, continue no OTC supplements or medications and repeat in one month, if still elevated may need GI consult order, please schedule Hep. C is negative so not responsible for elevation  Routing to provider for final review. Patient agreeable to disposition. Will close encounter.

## 2015-08-31 ENCOUNTER — Ambulatory Visit
Admission: RE | Admit: 2015-08-31 | Discharge: 2015-08-31 | Disposition: A | Discharge status: Home / Self Care | Payer: No Typology Code available for payment source | Source: Ambulatory Visit

## 2015-08-31 DIAGNOSIS — Z1231 Encounter for screening mammogram for malignant neoplasm of breast: Secondary | ICD-10-CM

## 2015-09-19 ENCOUNTER — Other Ambulatory Visit: Payer: No Typology Code available for payment source

## 2015-09-24 ENCOUNTER — Other Ambulatory Visit (INDEPENDENT_AMBULATORY_CARE_PROVIDER_SITE_OTHER): Payer: No Typology Code available for payment source

## 2015-09-24 DIAGNOSIS — R899 Unspecified abnormal finding in specimens from other organs, systems and tissues: Secondary | ICD-10-CM

## 2015-09-24 LAB — CBC
HEMATOCRIT: 34.8 % — AB (ref 36.0–46.0)
Hemoglobin: 11.6 g/dL — ABNORMAL LOW (ref 12.0–15.0)
MCH: 29.4 pg (ref 26.0–34.0)
MCHC: 33.3 g/dL (ref 30.0–36.0)
MCV: 88.3 fL (ref 78.0–100.0)
MPV: 10 fL (ref 8.6–12.4)
PLATELETS: 363 10*3/uL (ref 150–400)
RBC: 3.94 MIL/uL (ref 3.87–5.11)
RDW: 12.8 % (ref 11.5–15.5)
WBC: 6.6 10*3/uL (ref 4.0–10.5)

## 2015-09-24 LAB — HEPATIC FUNCTION PANEL
ALK PHOS: 45 U/L (ref 33–115)
ALT: 18 U/L (ref 6–29)
AST: 18 U/L (ref 10–35)
Albumin: 4.1 g/dL (ref 3.6–5.1)
Total Bilirubin: 0.2 mg/dL (ref 0.2–1.2)
Total Protein: 6.6 g/dL (ref 6.1–8.1)

## 2015-09-25 ENCOUNTER — Telehealth: Payer: Self-pay

## 2015-09-25 ENCOUNTER — Other Ambulatory Visit: Payer: Self-pay | Admitting: Certified Nurse Midwife

## 2015-09-25 DIAGNOSIS — D649 Anemia, unspecified: Secondary | ICD-10-CM

## 2015-09-25 NOTE — Telephone Encounter (Signed)
[email or web address]

## 2015-09-25 NOTE — Telephone Encounter (Signed)
-----   Message from Regina Eck, CNM sent at 09/25/2015 12:48 PM EST ----- Notify patient that ALT and liver function panel is normal. CBC is still borderline low iron. Needs daily iron supplement of time released iron. It has improved from 11.4 to 11.6. I think she needs to stay on this daily. Can recheck in 6 months. Order in

## 2015-09-26 NOTE — Telephone Encounter (Signed)
Patient notified of results as written by provider 

## 2015-09-26 NOTE — Telephone Encounter (Signed)
Patient left message on our voicemail last night around 4:56 returning your call.

## 2015-10-26 ENCOUNTER — Ambulatory Visit: Payer: No Typology Code available for payment source | Admitting: Certified Nurse Midwife

## 2015-11-16 ENCOUNTER — Ambulatory Visit (INDEPENDENT_AMBULATORY_CARE_PROVIDER_SITE_OTHER): Payer: No Typology Code available for payment source | Admitting: Certified Nurse Midwife

## 2015-11-16 ENCOUNTER — Encounter: Payer: Self-pay | Admitting: Certified Nurse Midwife

## 2015-11-16 VITALS — BP 120/70 | HR 72 | Resp 16 | Ht 63.5 in | Wt 201.0 lb

## 2015-11-16 DIAGNOSIS — Z3041 Encounter for surveillance of contraceptive pills: Secondary | ICD-10-CM

## 2015-11-16 NOTE — Progress Notes (Signed)
50 y.o. Divorced Caucasian G1P1001here for evaluation of Loestrin 24 initiated on 07/27/15 for contraception. Patient has one episode of BTB with second pack only. Heavy bleeding she was having has resolved with 7 -12 day periods before. Menses duration 5 days with heavy to light  flow. Patient taking medication as prescribed. Denies missed pills, headaches, nausea, DVT warning signs or symptoms,  breakthrough bleeding, or other changes.   Keeping menses calendar. Discussed labs that were elevated previously ALT and WBC and all WNL now with last blood draw. Concerns she may need to monitor more closely. No other health issues today.  O: Healthy female, WD WN Affect: normal orientation X 3    A: History of DUB improved with Loestrin 24 Fe use, desires continuance WBC and ALT elevation at aex 11/16, redraw 1/17 all  Normal  P: Continue Loestrin 24 Fe as prescribed has Rx, Patient will call if BTB occurs again. Discussed most likely concern with ALT elevation was OTC medication use such as Advil( she was using frequently due to DUB).Discussed concerns with overuse, questions addressed. WBC could have been elevated from slight viral illness but returned to normal, no problems noted. Will recheck at next aex.    28 minutes spent with patient  in face to face counseling regarding evaluation of OCP and review of labs.  RV prn, aex

## 2015-11-16 NOTE — Patient Instructions (Signed)

## 2015-11-21 NOTE — Progress Notes (Signed)
Encounter reviewed Jill Jertson, MD   

## 2016-07-30 ENCOUNTER — Ambulatory Visit (INDEPENDENT_AMBULATORY_CARE_PROVIDER_SITE_OTHER): Payer: No Typology Code available for payment source | Admitting: Certified Nurse Midwife

## 2016-07-30 ENCOUNTER — Encounter: Payer: Self-pay | Admitting: Certified Nurse Midwife

## 2016-07-30 VITALS — BP 124/82 | HR 70 | Resp 16 | Ht 63.25 in | Wt 207.0 lb

## 2016-07-30 DIAGNOSIS — Z30011 Encounter for initial prescription of contraceptive pills: Secondary | ICD-10-CM | POA: Diagnosis not present

## 2016-07-30 DIAGNOSIS — Z0000 Encounter for general adult medical examination without abnormal findings: Secondary | ICD-10-CM | POA: Diagnosis not present

## 2016-07-30 DIAGNOSIS — D649 Anemia, unspecified: Secondary | ICD-10-CM

## 2016-07-30 DIAGNOSIS — Z124 Encounter for screening for malignant neoplasm of cervix: Secondary | ICD-10-CM | POA: Diagnosis not present

## 2016-07-30 DIAGNOSIS — Z01419 Encounter for gynecological examination (general) (routine) without abnormal findings: Secondary | ICD-10-CM | POA: Diagnosis not present

## 2016-07-30 LAB — CBC
HCT: 36.9 % (ref 35.0–45.0)
HEMOGLOBIN: 12.2 g/dL (ref 11.7–15.5)
MCH: 29.2 pg (ref 27.0–33.0)
MCHC: 33.1 g/dL (ref 32.0–36.0)
MCV: 88.3 fL (ref 80.0–100.0)
MPV: 9.6 fL (ref 7.5–12.5)
Platelets: 374 10*3/uL (ref 140–400)
RBC: 4.18 MIL/uL (ref 3.80–5.10)
RDW: 13.1 % (ref 11.0–15.0)
WBC: 8.1 10*3/uL (ref 3.8–10.8)

## 2016-07-30 LAB — POCT URINALYSIS DIPSTICK
Bilirubin, UA: NEGATIVE
GLUCOSE UA: NEGATIVE
Ketones, UA: NEGATIVE
Leukocytes, UA: NEGATIVE
Nitrite, UA: NEGATIVE
PH UA: 5
PROTEIN UA: NEGATIVE
RBC UA: NEGATIVE
UROBILINOGEN UA: NEGATIVE

## 2016-07-30 LAB — FERRITIN: FERRITIN: 33 ng/mL (ref 10–232)

## 2016-07-30 MED ORDER — NORETHIN ACE-ETH ESTRAD-FE 1-20 MG-MCG(24) PO TABS
1.0000 | ORAL_TABLET | Freq: Every day | ORAL | 4 refills | Status: DC
Start: 2016-07-30 — End: 2017-08-26

## 2016-07-30 NOTE — Patient Instructions (Signed)
EXERCISE AND DIET:  We recommended that you start or continue a regular exercise program for good health. Regular exercise means any activity that makes your heart beat faster and makes you sweat.  We recommend exercising at least 30 minutes per day at least 3 days a week, preferably 4 or 5.  We also recommend a diet low in fat and sugar.  Inactivity, poor dietary choices and obesity can cause diabetes, heart attack, stroke, and kidney damage, among others.    ALCOHOL AND SMOKING:  Women should limit their alcohol intake to no more than 7 drinks/beers/glasses of wine (combined, not each!) per week. Moderation of alcohol intake to this level decreases your risk of breast cancer and liver damage. And of course, no recreational drugs are part of a healthy lifestyle.  And absolutely no smoking or even second hand smoke. Most people know smoking can cause heart and lung diseases, but did you know it also contributes to weakening of your bones? Aging of your skin?  Yellowing of your teeth and nails?  CALCIUM AND VITAMIN D:  Adequate intake of calcium and Vitamin D are recommended.  The recommendations for exact amounts of these supplements seem to change often, but generally speaking 600 mg of calcium (either carbonate or citrate) and 800 units of Vitamin D per day seems prudent. Certain women may benefit from higher intake of Vitamin D.  If you are among these women, your doctor will have told you during your visit.    PAP SMEARS:  Pap smears, to check for cervical cancer or precancers,  have traditionally been done yearly, although recent scientific advances have shown that most women can have pap smears less often.  However, every woman still should have a physical exam from her gynecologist every year. It will include a breast check, inspection of the vulva and vagina to check for abnormal growths or skin changes, a visual exam of the cervix, and then an exam to evaluate the size and shape of the uterus and  ovaries.  And after 48 years of age, a rectal exam is indicated to check for rectal cancers. We will also provide age appropriate advice regarding health maintenance, like when you should have certain vaccines, screening for sexually transmitted diseases, bone density testing, colonoscopy, mammograms, etc.   MAMMOGRAMS:  All women over 48 years old should have a yearly mammogram. Many facilities now offer a "3D" mammogram, which may cost around $50 extra out of pocket. If possible,  we recommend you accept the option to have the 3D mammogram performed.  It both reduces the number of women who will be called back for extra views which then turn out to be normal, and it is better than the routine mammogram at detecting truly abnormal areas.    COLONOSCOPY:  Colonoscopy to screen for colon cancer is recommended for all women at age 18.  We know, you hate the idea of the prep.  We agree, BUT, having colon cancer and not knowing it is worse!!  Colon cancer so often starts as a polyp that can be seen and removed at colonscopy, which can quite literally save your life!  And if your first colonoscopy is normal and you have no family history of colon cancer, most women don't have to have it again for 10 years.  Once every ten years, you can do something that may end up saving your life, right?  We will be happy to help you get it scheduled when you are ready.  Be sure to check your insurance coverage so you understand how much it will cost.  It may be covered as a preventative service at no cost, but you should check your particular policy.      Stress and Stress Management Stress is a normal reaction to life events. It is what you feel when life demands more than you are used to or more than you can handle. Some stress can be useful. For example, the stress reaction can help you catch the last bus of the day, study for a test, or meet a deadline at work. But stress that occurs too often or for too long can cause  problems. It can affect your emotional health and interfere with relationships and normal daily activities. Too much stress can weaken your immune system and increase your risk for physical illness. If you already have a medical problem, stress can make it worse. What are the causes? All sorts of life events may cause stress. An event that causes stress for one person may not be stressful for another person. Major life events commonly cause stress. These may be positive or negative. Examples include losing your job, moving into a new home, getting married, having a baby, or losing a loved one. Less obvious life events may also cause stress, especially if they occur day after day or in combination. Examples include working long hours, driving in traffic, caring for children, being in debt, or being in a difficult relationship. What are the signs or symptoms? Stress may cause emotional symptoms including, the following:  Anxiety. This is feeling worried, afraid, on edge, overwhelmed, or out of control.  Anger. This is feeling irritated or impatient.  Depression. This is feeling sad, down, helpless, or guilty.  Difficulty focusing, remembering, or making decisions.  Stress may cause physical symptoms, including the following:  Aches and pains. These may affect your head, neck, back, stomach, or other areas of your body.  Tight muscles or clenched jaw.  Low energy or trouble sleeping.  Stress may cause unhealthy behaviors, including the following:  Eating to feel better (overeating) or skipping meals.  Sleeping too little, too much, or both.  Working too much or putting off tasks (procrastination).  Smoking, drinking alcohol, or using drugs to feel better.  How is this diagnosed? Stress is diagnosed through an assessment by your health care provider. Your health care provider will ask questions about your symptoms and any stressful life events.Your health care provider will also ask  about your medical history and may order blood tests or other tests. Certain medical conditions and medicine can cause physical symptoms similar to stress. Mental illness can cause emotional symptoms and unhealthy behaviors similar to stress. Your health care provider may refer you to a mental health professional for further evaluation. How is this treated? Stress management is the recommended treatment for stress.The goals of stress management are reducing stressful life events and coping with stress in healthy ways. Techniques for reducing stressful life events include the following:  Stress identification. Self-monitor for stress and identify what causes stress for you. These skills may help you to avoid some stressful events.  Time management. Set your priorities, keep a calendar of events, and learn to say "no." These tools can help you avoid making too many commitments.  Techniques for coping with stress include the following:  Rethinking the problem. Try to think realistically about stressful events rather than ignoring them or overreacting. Try to find the positives in a stressful situation   rather than focusing on the negatives.  Exercise. Physical exercise can release both physical and emotional tension. The key is to find a form of exercise you enjoy and do it regularly.  Relaxation techniques. These relax the body and mind. Examples include yoga, meditation, tai chi, biofeedback, deep breathing, progressive muscle relaxation, listening to music, being out in nature, journaling, and other hobbies. Again, the key is to find one or more that you enjoy and can do regularly.  Healthy lifestyle. Eat a balanced diet, get plenty of sleep, and do not smoke. Avoid using alcohol or drugs to relax.  Strong support network. Spend time with family, friends, or other people you enjoy being around.Express your feelings and talk things over with someone you trust.  Counseling or talktherapy with a  mental health professional may be helpful if you are having difficulty managing stress on your own. Medicine is typically not recommended for the treatment of stress.Talk to your health care provider if you think you need medicine for symptoms of stress. Follow these instructions at home:  Keep all follow-up visits as directed by your health care provider.  Take all medicines as directed by your health care provider. Contact a health care provider if:  Your symptoms get worse or you start having new symptoms.  You feel overwhelmed by your problems and can no longer manage them on your own. Get help right away if:  You feel like hurting yourself or someone else. This information is not intended to replace advice given to you by your health care provider. Make sure you discuss any questions you have with your health care provider. Document Released: 02/18/2001 Document Revised: 01/31/2016 Document Reviewed: 04/19/2013 Elsevier Interactive Patient Education  2017 Elsevier Inc[employer]   

## 2016-07-30 NOTE — Progress Notes (Signed)
50 y.o. G72P1001 Divorced  Caucasian Fe here for annual exam. Periods scant to none on OCP. Happy with choice and no BTB. Hot flashes have resolved. Emotional stress with father's estate, but has now resolved. Working on weight loss. Sees Urgent care if needed. No other health issues today.  Patient's last menstrual period was 07/05/2016 (exact date).          Sexually active: Yes.    The current method of family planning is OCP (estrogen/progesterone).    Exercising: Yes.    walk & run Smoker:  no  Health Maintenance: Pap:  06-14-14 neg HPV HR neg MMG:  08-31-15 category c density birads 1:neg Colonoscopy:  none BMD:   none TDaP: utd  Shingles: no Pneumonia: no Hep C and HIV: hep c neg 2016 Labs: hgb-11.7, poct urine-neg Self breast exam: done monthly   reports that she quit smoking about 22 years ago. She has never used smokeless tobacco. She reports that she drinks about 0.6 oz of alcohol per week . She reports that she does not use drugs.  History reviewed. No pertinent past medical history.  Past Surgical History:  Procedure Laterality Date  . Broken Wrist    . CESAREAN SECTION    . endometrial biopsy  10/15    Current Outpatient Prescriptions  Medication Sig Dispense Refill  . CALCIUM PO Take by mouth daily.    . Multiple Vitamin (MULTIVITAMIN) tablet Take 1 tablet by mouth daily.      . Norethindrone Acetate-Ethinyl Estrad-FE (LOESTRIN 24 FE) 1-20 MG-MCG(24) tablet Take 1 tablet by mouth daily. 3 Package 4   No current facility-administered medications for this visit.     Family History  Problem Relation Age of Onset  . Breast cancer Mother 74    died 38  . Diabetes Father   . Hypertension Father   . Breast cancer Maternal Grandmother 60  . Breast cancer Paternal Grandmother 2    Age 52's  . Breast cancer Paternal Aunt 20  . Breast cancer Paternal Aunt     ROS:  Pertinent items are noted in HPI.  Otherwise, a comprehensive ROS was negative.  Exam:   BP  124/82   Pulse 70   Resp 16   Ht 5' 3.25" (1.607 m)   Wt 207 lb (93.9 kg)   LMP 07/05/2016 (Exact Date)   BMI 36.38 kg/m  Height: 5' 3.25" (160.7 cm) Ht Readings from Last 3 Encounters:  07/30/16 5' 3.25" (1.607 m)  11/16/15 5' 3.5" (1.613 m)  07/27/15 5' 3.5" (1.613 m)    General appearance: alert, cooperative and appears stated age Head: Normocephalic, without obvious abnormality, atraumatic Neck: no adenopathy, supple, symmetrical, trachea midline and thyroid normal to inspection and palpation Lungs: clear to auscultation bilaterally Breasts: normal appearance, no masses or tenderness, No nipple retraction or dimpling, No nipple discharge or bleeding, No axillary or supraclavicular adenopathy Heart: regular rate and rhythm Abdomen: soft, non-tender; no masses,  no organomegaly Extremities: extremities normal, atraumatic, no cyanosis or edema Skin: Skin color, texture, turgor normal. No rashes or lesions Lymph nodes: Cervical, supraclavicular, and axillary nodes normal. No abnormal inguinal nodes palpated Neurologic: Grossly normal   Pelvic: External genitalia:  no lesions              Urethra:  normal appearing urethra with no masses, tenderness or lesions              Bartholin's and Skene's: normal  Vagina: normal appearing vagina with normal color and discharge, no lesions              Cervix: no bleeding following Pap, no cervical motion tenderness and no lesions              Pap taken: Yes.   Bimanual Exam:  Uterus:  normal size, contour, position, consistency, mobility, non-tender              Adnexa: normal adnexa and no mass, fullness, tenderness               Rectovaginal: Confirms               Anus:  normal sphincter tone, no lesions  Chaperone present: yes  A:  Well Woman with normal exam  Contraception for cycle management working well  Morbid obesity  Working on weight loss  Screening labs  P:   Reviewed health and wellness pertinent to  exam  Rx Loestrin 24 Fe see order with instructions  Encouraged to watch portion control and late evening overeating  Lab : CMP, Vit. D,Iron, IBC  Pap smear as above with HPV reflex   counseled on breast self exam, mammography screening, use and side effects of OCP's, adequate intake of calcium and vitamin D, diet and exercise   An After Visit Summary was printed and given to the patient.

## 2016-07-31 LAB — COMPREHENSIVE METABOLIC PANEL
ALT: 15 U/L (ref 6–29)
AST: 20 U/L (ref 10–35)
Albumin: 4.4 g/dL (ref 3.6–5.1)
Alkaline Phosphatase: 42 U/L (ref 33–115)
BUN: 8 mg/dL (ref 7–25)
CHLORIDE: 104 mmol/L (ref 98–110)
CO2: 27 mmol/L (ref 20–31)
Calcium: 9.3 mg/dL (ref 8.6–10.2)
Creat: 0.74 mg/dL (ref 0.50–1.10)
Glucose, Bld: 74 mg/dL (ref 65–99)
POTASSIUM: 4.7 mmol/L (ref 3.5–5.3)
Sodium: 140 mmol/L (ref 135–146)
Total Bilirubin: 0.2 mg/dL (ref 0.2–1.2)
Total Protein: 7.1 g/dL (ref 6.1–8.1)

## 2016-07-31 LAB — IBC PANEL
%SAT: 15 % (ref 11–50)
TIBC: 439 ug/dL (ref 250–450)
UIBC: 374 ug/dL (ref 125–400)

## 2016-07-31 LAB — IRON: IRON: 65 ug/dL (ref 40–190)

## 2016-07-31 LAB — VITAMIN D 25 HYDROXY (VIT D DEFICIENCY, FRACTURES): VIT D 25 HYDROXY: 32 ng/mL (ref 30–100)

## 2016-08-01 NOTE — Progress Notes (Signed)
Encounter reviewed Tanieka Pownall, MD   

## 2016-08-04 LAB — HEMOGLOBIN, FINGERSTICK: Hemoglobin, fingerstick: 11.7 g/dL — ABNORMAL LOW (ref 12.0–16.0)

## 2016-08-05 LAB — IPS PAP TEST WITH REFLEX TO HPV

## 2016-08-08 ENCOUNTER — Ambulatory Visit: Payer: No Typology Code available for payment source | Admitting: Certified Nurse Midwife

## 2016-08-12 ENCOUNTER — Other Ambulatory Visit: Payer: Self-pay | Admitting: Certified Nurse Midwife

## 2016-08-12 DIAGNOSIS — Z1231 Encounter for screening mammogram for malignant neoplasm of breast: Secondary | ICD-10-CM

## 2016-09-19 ENCOUNTER — Ambulatory Visit
Admission: RE | Admit: 2016-09-19 | Discharge: 2016-09-19 | Disposition: A | Discharge status: Home / Self Care | Payer: No Typology Code available for payment source | Source: Ambulatory Visit | Attending: Certified Nurse Midwife | Admitting: Certified Nurse Midwife

## 2016-09-19 DIAGNOSIS — Z1231 Encounter for screening mammogram for malignant neoplasm of breast: Secondary | ICD-10-CM

## 2016-09-22 ENCOUNTER — Other Ambulatory Visit: Payer: Self-pay | Admitting: Certified Nurse Midwife

## 2016-09-22 DIAGNOSIS — Z30011 Encounter for initial prescription of contraceptive pills: Secondary | ICD-10-CM

## 2016-09-22 NOTE — Telephone Encounter (Signed)
Medication refill request: OCP  Last AEX:  07/30/16 DL  Next AEX: 08/06/17  Last MMG (if hormonal medication request): 08/31/15 BIRADS1:Neg  Refill authorized: 07/30/16 #3packs/4R. To [facility]

## 2017-08-06 ENCOUNTER — Ambulatory Visit: Payer: No Typology Code available for payment source | Admitting: Certified Nurse Midwife

## 2017-08-06 ENCOUNTER — Other Ambulatory Visit: Payer: Self-pay

## 2017-08-06 ENCOUNTER — Encounter: Payer: Self-pay | Admitting: Certified Nurse Midwife

## 2017-08-06 VITALS — BP 124/82 | HR 80 | Resp 16 | Ht 63.25 in | Wt 219.0 lb

## 2017-08-06 DIAGNOSIS — R635 Abnormal weight gain: Secondary | ICD-10-CM | POA: Diagnosis not present

## 2017-08-06 DIAGNOSIS — E559 Vitamin D deficiency, unspecified: Secondary | ICD-10-CM

## 2017-08-06 DIAGNOSIS — Z0000 Encounter for general adult medical examination without abnormal findings: Secondary | ICD-10-CM

## 2017-08-06 DIAGNOSIS — Z01419 Encounter for gynecological examination (general) (routine) without abnormal findings: Secondary | ICD-10-CM | POA: Diagnosis not present

## 2017-08-06 DIAGNOSIS — Z1211 Encounter for screening for malignant neoplasm of colon: Secondary | ICD-10-CM | POA: Diagnosis not present

## 2017-08-06 DIAGNOSIS — N951 Menopausal and female climacteric states: Secondary | ICD-10-CM | POA: Diagnosis not present

## 2017-08-06 NOTE — Progress Notes (Signed)
51 y.o. G66P1001 Divorced  Caucasian Fe here for annual exam. Periods light, almost none and last one 8/18. OCP working well, no issues, would like continue. Some hot flashes and night sweats, ? Menopausal changes. Uses Urgent care if needed. Was seen by orthopedic for knee injury, no treatment needed. Has gained 16 pounds over the past year. Same partner no change or STD screening needed. Patient has noted some upper quadrant pain on left sporadically in past few months and will sometime wake up with this. Has not taken medication for. Aware she had gained weight and felt this may be the issue.. No nausea or intense pain or GERD or bowel change. No other health issues today.. Screening labs today.  No LMP recorded (approximate). Patient is not currently having periods (Reason: Other).   8/18       Sexually active: Yes.    The current method of family planning is OCP (estrogen/progesterone).    Exercising: Yes.    walking Smoker:  no  Health Maintenance: Pap:  06-14-14 neg HPV HR neg, 07-30-16 neg History of Abnormal Pap: no MMG:  09-19-16 category b density birads 1:neg Self Breast exams: yes Colonoscopy:  none BMD:   none TDaP:  utd Shingles: no Pneumonia: no Hep C and HIV: hep c neg 2016 Labs: yes   reports that she quit smoking about 23 years ago. she has never used smokeless tobacco. She reports that she drinks alcohol. She reports that she does not use drugs.  History reviewed. No pertinent past medical history.  Past Surgical History:  Procedure Laterality Date  . Broken Wrist    . CESAREAN SECTION    . endometrial biopsy  10/15    Current Outpatient Medications  Medication Sig Dispense Refill  . CALCIUM PO Take by mouth daily.    . Multiple Vitamin (MULTIVITAMIN) tablet Take 1 tablet by mouth daily.      . Norethindrone Acetate-Ethinyl Estrad-FE (LOESTRIN 24 FE) 1-20 MG-MCG(24) tablet Take 1 tablet by mouth daily. 3 Package 4   No current facility-administered medications  for this visit.     Family History  Problem Relation Age of Onset  . Breast cancer Mother 46       died 62  . Diabetes Father   . Hypertension Father   . Breast cancer Maternal Grandmother 60  . Breast cancer Paternal Grandmother 62       Age 69's  . Breast cancer Paternal Aunt 53  . Breast cancer Paternal Aunt     ROS:  Pertinent items are noted in HPI.  Otherwise, a comprehensive ROS was negative.  Exam:   BP 124/82   Pulse 80   Resp 16   Ht 5' 3.25" (1.607 m)   Wt 219 lb (99.3 kg)   LMP  (Approximate) Comment: 8/18 lmp  BMI 38.49 kg/m  Height: 5' 3.25" (160.7 cm) Ht Readings from Last 3 Encounters:  08/06/17 5' 3.25" (1.607 m)  07/30/16 5' 3.25" (1.607 m)  11/16/15 5' 3.5" (1.613 m)    General appearance: alert, cooperative and appears stated age Head: Normocephalic, without obvious abnormality, atraumatic Neck: no adenopathy, supple, symmetrical, trachea midline and thyroid normal to inspection and palpation Lungs: clear to auscultation bilaterally Breasts: normal appearance, no masses or tenderness, No nipple retraction or dimpling, No nipple discharge or bleeding, No axillary or supraclavicular adenopathy Heart: regular rate and rhythm Abdomen: soft, non-tender; no masses,  no organomegaly, no point of pain noted on exam, + bowel sounds all 4  quadrants,, no rebound Extremities: extremities normal, atraumatic, no cyanosis or edema Skin: Skin color, texture, turgor normal. No rashes or lesions Lymph nodes: Cervical, supraclavicular, and axillary nodes normal. No abnormal inguinal nodes palpated Neurologic: Grossly normal   Pelvic: External genitalia:  no lesions              Urethra:  normal appearing urethra with no masses, tenderness or lesions              Bartholin's and Skene's: normal                 Vagina: normal appearing vagina with normal color and discharge, no lesions              Cervix: no cervical motion tenderness, no lesions and normal  appearance              Pap taken: No. Bimanual Exam:  Uterus:  normal size, contour, position, consistency, mobility, non-tender              Adnexa: normal adnexa and no mass, fullness, tenderness               Rectovaginal: Confirms               Anus:  normal sphincter tone, no lesions  Chaperone present: yes  A:  Well Woman with normal exam  Contraception OCP desired  Weight gain of 16 pounds in one year  LUQ pain sporadic needs evaluation if continues  Family history of breast cancer, MGM 60,M 55, PA 65, PGM 60  Screening labs    P:   Reviewed health and wellness pertinent to exam  Discussed risks/benefits/warning signs of OCP and will need to be off at 51. Patient desires continuance ? Perimenopausal symptoms. Discussed evaluation with lab, patient agreeable.  Rx Loestrin 24 Fe see order with instructions  Will check Lab: [facility]  Discussed weight gain over 200 increases cardiovascular and skeletal health risk. Discussed bariatric nutrition option and increase in exercise and decrease portion size of food. Patient will try to work on.  Discussed risks/benefits of colonoscopy and need to evaluate LUQ pain with GI.  Can do this with GI. Patient requests referral. Patient will be called with information.  Discussed importance of SBE and annual mammogram. Also discussed genetic screening with significant family history. Patient declines information.  Lab:CBC, CMP, Lipid panel , TSH, Vitamin D  Pap smear: no   counseled on breast self exam, mammography screening, feminine hygiene, menopause, adequate intake of calcium and vitamin D, diet and exercise  return annually or prn  An After Visit Summary was printed and given to the patient.

## 2017-08-06 NOTE — Patient Instructions (Signed)

## 2017-08-07 LAB — COMPREHENSIVE METABOLIC PANEL
ALBUMIN: 4.3 g/dL (ref 3.5–5.5)
ALK PHOS: 60 IU/L (ref 39–117)
ALT: 16 IU/L (ref 0–32)
AST: 19 IU/L (ref 0–40)
Albumin/Globulin Ratio: 1.5 (ref 1.2–2.2)
BILIRUBIN TOTAL: 0.2 mg/dL (ref 0.0–1.2)
BUN/Creatinine Ratio: 14 (ref 9–23)
BUN: 9 mg/dL (ref 6–24)
CHLORIDE: 99 mmol/L (ref 96–106)
CO2: 26 mmol/L (ref 20–29)
CREATININE: 0.64 mg/dL (ref 0.57–1.00)
Calcium: 9.8 mg/dL (ref 8.7–10.2)
GFR calc Af Amer: 121 mL/min/{1.73_m2} (ref >59)
GFR calc non Af Amer: 105 mL/min/{1.73_m2} (ref >59)
GLUCOSE: 77 mg/dL (ref 65–99)
Globulin, Total: 2.8 g/dL (ref 1.5–4.5)
Potassium: 4.3 mmol/L (ref 3.5–5.2)
SODIUM: 141 mmol/L (ref 134–144)
Total Protein: 7.1 g/dL (ref 6.0–8.5)

## 2017-08-07 LAB — LIPID PANEL
CHOL/HDL RATIO: 4 ratio (ref 0.0–4.4)
Cholesterol, Total: 222 mg/dL — ABNORMAL HIGH (ref 100–199)
HDL: 56 mg/dL (ref >39)
LDL CALC: 137 mg/dL — AB (ref 0–99)
Triglycerides: 143 mg/dL (ref 0–149)
VLDL CHOLESTEROL CAL: 29 mg/dL (ref 5–40)

## 2017-08-07 LAB — VITAMIN D 25 HYDROXY (VIT D DEFICIENCY, FRACTURES): Vit D, 25-Hydroxy: 25.2 ng/mL — ABNORMAL LOW (ref 30.0–100.0)

## 2017-08-07 LAB — TSH: TSH: 1.62 u[IU]/mL (ref 0.450–4.500)

## 2017-08-07 LAB — CBC
HEMATOCRIT: 37.6 % (ref 34.0–46.6)
Hemoglobin: 12.3 g/dL (ref 11.1–15.9)
MCH: 29.4 pg (ref 26.6–33.0)
MCHC: 32.7 g/dL (ref 31.5–35.7)
MCV: 90 fL (ref 79–97)
PLATELETS: 394 10*3/uL — AB (ref 150–379)
RBC: 4.19 x10E6/uL (ref 3.77–5.28)
RDW: 13.2 % (ref 12.3–15.4)
WBC: 11.6 10*3/uL — AB (ref 3.4–10.8)

## 2017-08-07 LAB — FOLLICLE STIMULATING HORMONE: FSH: 18.5 m[IU]/mL

## 2017-08-11 ENCOUNTER — Telehealth: Payer: Self-pay

## 2017-08-11 DIAGNOSIS — E559 Vitamin D deficiency, unspecified: Secondary | ICD-10-CM

## 2017-08-11 DIAGNOSIS — R7989 Other specified abnormal findings of blood chemistry: Secondary | ICD-10-CM

## 2017-08-11 DIAGNOSIS — D72829 Elevated white blood cell count, unspecified: Secondary | ICD-10-CM

## 2017-08-11 NOTE — Telephone Encounter (Signed)
Spoke with patient. Results given. Patient verbalizes understanding. 2 week recheck scheduled for 08/21/2017 at 1:40 pm. 3 month recheck scheduled for 11/06/2017 at 1:30 pm. Orders placed. Encounter closed.  Notes recorded by Regina Eck, CNM on 08/10/2017 at 8:57 AM EST Notify patient that Vitamin D level is low and was borderline last year. Needs to start on Vitamin D 3 1000 IU daily and recheck in 3 months order placed TSH is normal Lipid panel is elevated at 222, 200 is normal and LDL is elevated at 137 normal <100, work on weight loss as discussed and increase fiber in diet and more fresh vegetables and fruits. Recheck at aex. HDL is normal Liver, kidney and glucose are normal CBC shows elevated WBC count and slightly elevated platelets. Recheck CBC in 2 weeks if no signs of illness. No order placed [place] does not indicated menopause. Not unusual to have amenorrhea with OCP. Ok to continue use at this point. Recheck at next (if indicated) aex, no issues during the year.

## 2017-08-12 ENCOUNTER — Other Ambulatory Visit: Payer: Self-pay | Admitting: Certified Nurse Midwife

## 2017-08-12 DIAGNOSIS — Z139 Encounter for screening, unspecified: Secondary | ICD-10-CM

## 2017-08-19 ENCOUNTER — Telehealth: Payer: Self-pay | Admitting: Certified Nurse Midwife

## 2017-08-19 ENCOUNTER — Other Ambulatory Visit: Payer: Self-pay | Admitting: Certified Nurse Midwife

## 2017-08-19 DIAGNOSIS — G8929 Other chronic pain: Secondary | ICD-10-CM

## 2017-08-19 DIAGNOSIS — R1012 Left upper quadrant pain: Secondary | ICD-10-CM

## 2017-08-19 NOTE — Telephone Encounter (Signed)
Spoke with patient to convey appointment information to Dr [facility] office for a routine screening colonoscopy on 12/22/17. Patient states she was under the impression she was going to dr Dr Collene Mares for off and on pain she is having in her rib cage area. Advised patient we will clarify this information with her provider and contact Dr Lorie Apley office if we need to make charges to the appointment date and time.   Routing to [place], CNM

## 2017-08-19 NOTE — Telephone Encounter (Signed)
Call placed to patient to discuss new referral to Dr [facility] office. Left voicemail message requesting a return call.

## 2017-08-21 ENCOUNTER — Other Ambulatory Visit (INDEPENDENT_AMBULATORY_CARE_PROVIDER_SITE_OTHER): Payer: No Typology Code available for payment source

## 2017-08-21 DIAGNOSIS — D72829 Elevated white blood cell count, unspecified: Secondary | ICD-10-CM

## 2017-08-21 DIAGNOSIS — R7989 Other specified abnormal findings of blood chemistry: Secondary | ICD-10-CM

## 2017-08-21 DIAGNOSIS — E559 Vitamin D deficiency, unspecified: Secondary | ICD-10-CM

## 2017-08-21 NOTE — Telephone Encounter (Signed)
Return call to [facility]. Patient aware Deloris Ping is not in the office today.

## 2017-08-22 LAB — CBC
Hematocrit: 35.4 % (ref 34.0–46.6)
Hemoglobin: 12.1 g/dL (ref 11.1–15.9)
MCH: 29.4 pg (ref 26.6–33.0)
MCHC: 34.2 g/dL (ref 31.5–35.7)
MCV: 86 fL (ref 79–97)
PLATELETS: 362 10*3/uL (ref 150–379)
RBC: 4.12 x10E6/uL (ref 3.77–5.28)
RDW: 13.5 % (ref 12.3–15.4)
WBC: 10 10*3/uL (ref 3.4–10.8)

## 2017-08-25 NOTE — Telephone Encounter (Signed)
Call placed to patient to advise appointment with Dr Collene Mares and been rescheduled to a sooner appointment date.  Left a voicemail message requesting a return call. Upon return call will convey the new appointment date is 08/27/17 at 1:30 PM with Dr Collene Mares. Patient will need to arrive to the appointment 10 minutes early for check-in.   cc: Melvia Heaps, CNM

## 2017-08-25 NOTE — Telephone Encounter (Signed)
Patient returned call. Patient was provided information for scheduled appointment with Dr Collene Mares. Patient also provided the phone number of [phone] should she need to make any changes to the appointment.   Forwarding to Cisco for final review.

## 2017-08-26 ENCOUNTER — Other Ambulatory Visit: Payer: Self-pay | Admitting: Certified Nurse Midwife

## 2017-08-26 DIAGNOSIS — Z30011 Encounter for initial prescription of contraceptive pills: Secondary | ICD-10-CM

## 2017-08-26 NOTE — Telephone Encounter (Signed)
Medication refill request: OCP  Last AEX:  08-06-17  Next AEX: 08-20-18  Last MMG (if hormonal medication request): 09-19-16 WNL  Refill authorized: please advise

## 2017-09-21 ENCOUNTER — Ambulatory Visit: Payer: No Typology Code available for payment source

## 2017-09-29 ENCOUNTER — Ambulatory Visit
Admission: RE | Admit: 2017-09-29 | Discharge: 2017-09-29 | Disposition: A | Discharge status: Home / Self Care | Payer: No Typology Code available for payment source | Source: Ambulatory Visit | Attending: Certified Nurse Midwife | Admitting: Certified Nurse Midwife

## 2017-09-29 DIAGNOSIS — Z139 Encounter for screening, unspecified: Secondary | ICD-10-CM

## 2017-09-30 ENCOUNTER — Other Ambulatory Visit: Payer: Self-pay | Admitting: Certified Nurse Midwife

## 2017-09-30 DIAGNOSIS — R928 Other abnormal and inconclusive findings on diagnostic imaging of breast: Secondary | ICD-10-CM

## 2017-10-02 ENCOUNTER — Other Ambulatory Visit: Payer: Self-pay | Admitting: Certified Nurse Midwife

## 2017-10-02 ENCOUNTER — Ambulatory Visit
Admission: RE | Admit: 2017-10-02 | Discharge: 2017-10-02 | Disposition: A | Discharge status: Home / Self Care | Payer: No Typology Code available for payment source | Source: Ambulatory Visit | Attending: Certified Nurse Midwife | Admitting: Certified Nurse Midwife

## 2017-10-02 DIAGNOSIS — R921 Mammographic calcification found on diagnostic imaging of breast: Secondary | ICD-10-CM

## 2017-10-02 DIAGNOSIS — N6310 Unspecified lump in the right breast, unspecified quadrant: Secondary | ICD-10-CM

## 2017-10-02 DIAGNOSIS — R928 Other abnormal and inconclusive findings on diagnostic imaging of breast: Secondary | ICD-10-CM

## 2017-10-14 ENCOUNTER — Telehealth: Payer: Self-pay | Admitting: Certified Nurse Midwife

## 2017-10-14 ENCOUNTER — Encounter: Payer: Self-pay | Admitting: Certified Nurse Midwife

## 2017-10-14 DIAGNOSIS — Z9189 Other specified personal risk factors, not elsewhere classified: Secondary | ICD-10-CM

## 2017-10-14 DIAGNOSIS — Z1231 Encounter for screening mammogram for malignant neoplasm of breast: Secondary | ICD-10-CM

## 2017-10-14 DIAGNOSIS — Z803 Family history of malignant neoplasm of breast: Secondary | ICD-10-CM

## 2017-10-14 NOTE — Telephone Encounter (Signed)
Yes and also will need to work with her having an MRI at 6 months and mammogram the other 6 months for adequate screening due to increased Breast cancer risk of greater than 25 %

## 2017-10-14 NOTE — Telephone Encounter (Signed)
Message   ----- Message from [place], Generic sent at 10/14/2017 3:09 PM EST -----    Dr. Jimmye Norman encouraged me to have the Brca testing done due to my recent mammogram and strong family history of breast cancer. Is this something your office can arrange for me? What do I need to do?  Thank you, Alyzah Pelly. Mateo Flow

## 2017-10-14 NOTE — Telephone Encounter (Signed)
Okay to refer for genetic counseling and testing through [facility]?

## 2017-10-15 NOTE — Telephone Encounter (Signed)
Spoke with patient. Advised of message as seen below from [place]. Patient verbalizes understanding. Aware referral has been placed to genetics for counseling and testing and that she will be contacted by [facility] to schedule. Would like to also have breast MRI once a year. Advised will placed an order to [facility] for breast MRI and she will be contacted directly by [facility] to schedule. Patient is agreeable. Order placed.  Cc: Dr.Miller to advise of breast MRI order  Routing to provider for final review. Patient agreeable to disposition. Will close encounter.

## 2017-10-15 NOTE — Telephone Encounter (Signed)
Referral placed for genetic counseling and testing with [facility].   Left message to call [place] at [phone].

## 2017-10-19 ENCOUNTER — Telehealth: Payer: Self-pay | Admitting: Genetic Counselor

## 2017-10-19 ENCOUNTER — Encounter: Payer: Self-pay | Admitting: Genetic Counselor

## 2017-10-19 NOTE — Telephone Encounter (Signed)
A genetic counseling appt has been scheduled for the pt to see Roma Kayser on 3/25 at 2pm. Pt aware to arrive 30 minutes early. Letter mailed.

## 2017-10-21 ENCOUNTER — Encounter: Payer: Self-pay | Admitting: Certified Nurse Midwife

## 2017-10-21 ENCOUNTER — Telehealth: Payer: Self-pay | Admitting: Certified Nurse Midwife

## 2017-10-21 NOTE — Telephone Encounter (Signed)
Spoke with patient, advised [facility] will call patient directly to schedule MRI after preauthorization completed by our business office.   Per review of telephone encounter dated 10/14/17 - explained MRI and MMG recommended with 6 month intervals for adequate screening. Patient is scheduled for f/u Dx imaging of right breast in July, advised continue as scheduled, will plan for MRI in August. Patient agreeable to plan.   Routing to [place], CNM, will close encounter.

## 2017-10-21 NOTE — Telephone Encounter (Signed)
Patient sent the following correspondence through [place]. Routing to triage to assist patient with request.  ----- Message from [name], Generic sent at 10/21/2017 9:38 AM EST -----    I spoke with Jennifer Harrison last week concerning an MRI due to my latest mammogram. I understood her to say I would receive a call from the Imaging place to schedule an appointment but I haven't heard from anyone. Maybe I misunderstood. I just need to know if I need to call them myself and who do I call. Thank you so much.

## 2017-11-06 ENCOUNTER — Other Ambulatory Visit: Payer: No Typology Code available for payment source

## 2017-11-30 ENCOUNTER — Inpatient Hospital Stay: Payer: No Typology Code available for payment source | Attending: Genetic Counselor | Admitting: Genetic Counselor

## 2017-11-30 ENCOUNTER — Inpatient Hospital Stay: Payer: No Typology Code available for payment source

## 2017-11-30 ENCOUNTER — Encounter: Payer: Self-pay | Admitting: Genetic Counselor

## 2017-11-30 DIAGNOSIS — Z803 Family history of malignant neoplasm of breast: Secondary | ICD-10-CM | POA: Diagnosis not present

## 2017-11-30 DIAGNOSIS — Z1379 Encounter for other screening for genetic and chromosomal anomalies: Secondary | ICD-10-CM

## 2017-11-30 DIAGNOSIS — Z87891 Personal history of nicotine dependence: Secondary | ICD-10-CM

## 2017-11-30 NOTE — Progress Notes (Signed)
REFERRING PROVIDER: Regina Harrison, CNM [address] [place], [place] [address]  PRIMARY PROVIDER:  Patient, [name]  PRIMARY REASON FOR VISIT:  1. Family history of breast cancer      HISTORY OF PRESENT ILLNESS:   Jennifer Harrison, a 52 y.o. female, was seen for a [place] cancer genetics consultation at the request of Jennifer Harrison due to a family history of cancer.  Jennifer Harrison presents to clinic today to discuss the possibility of a hereditary predisposition to cancer, genetic testing, and to further clarify her future cancer risks, as well as potential cancer risks for family members.   Jennifer Harrison is a 52 y.o. female with no personal history of cancer.  She had a recent mammogram that found calcifications in her breast and she will come back in 6 months.  CANCER HISTORY:   No history exists.     HORMONAL RISK FACTORS:  Menarche was at age 95-13.  First live birth at age 86.  OCP use for approximately 5-10 years.  Ovaries intact: yes.  Hysterectomy: no.  Menopausal status: premenopausal.  HRT use: 0 years. Colonoscopy: no; not examined. Mammogram within the last year: yes. Number of breast biopsies: 0. Up to date with pelvic exams:  yes. Any excessive radiation exposure in the past:  no  Past Medical History:  Diagnosis Date  . Family history of breast cancer     Past Surgical History:  Procedure Laterality Date  . Broken Wrist    . CESAREAN SECTION    . endometrial biopsy  10/15    Social History   Socioeconomic History  . Marital status: Divorced    Spouse name: Not on file  . Number of children: Not on file  . Years of education: Not on file  . Highest education level: Not on file  Occupational History  . Not on file  Social Needs  . Financial resource strain: Not on file  . Food insecurity:    Worry: Not on file    Inability: Not on file  . Transportation needs:    Medical: Not on file    Non-medical: Not on file    Tobacco Use  . Smoking status: Former Smoker    Last attempt to quit: 09/08/1993    Years since quitting: 24.2  . Smokeless tobacco: Never Used  Substance and Sexual Activity  . Alcohol use: Yes    Comment: 2-3 a month  . Drug use: No  . Sexual activity: Yes    Partners: Male    Birth control/protection: Pill  Lifestyle  . Physical activity:    Days per week: Not on file    Minutes per session: Not on file  . Stress: Not on file  Relationships  . Social connections:    Talks on phone: Not on file    Gets together: Not on file    Attends religious service: Not on file    Active member of club or organization: Not on file    Attends meetings of clubs or organizations: Not on file    Relationship status: Not on file  Other Topics Concern  . Not on file  Social History Narrative  . Not on file     FAMILY HISTORY:  We obtained a detailed, 4-generation family history.  Significant diagnoses are listed below: Family History  Problem Relation Age of Onset  . Breast cancer Mother 61       died 88; "negative genetic testing"  .  Diabetes Father   . Hypertension Father   . Hydrocephalus Brother   . Breast cancer Maternal Grandmother 39       d. 54  . Breast cancer Paternal Grandmother 59       d. 67  . Breast cancer Paternal Aunt 71  . Breast cancer Paternal Aunt 66       "genetic testing negative"  . Other Maternal Grandfather        WWII  . Lymphoma Paternal Grandfather     The patient has one son who is cancer free.  She has a brother and sister who are cancer free.  Her brother died at 43 from complications of the flu, but had hydrocephalus.  Her mother died at 49 from breast cancer.  Her father died at 26.  The patient's mother was diagnosed with breast cancer at 9.  She reportedly had genetic testing that was negative.  She had a brother and sister who are cancer free.  Her mother developed breast cancer at 73-69 and died at 65 from other causes.  Her mother's sister  had breast cancer over the age of 66.  The patient's maternal grandfather died in WWII.  The patient's father has two sisters who had breast cancer at age 59. One reportedly had genetic testing that was negative. The paternal grandmother had breast cancer at 23 and the grandfather had lymphoma at 54.  Patient's maternal ancestors are of Caucasian descent, and paternal ancestors are of [place] descent. There is no reported Ashkenazi Jewish ancestry. There is no known consanguinity.  GENETIC COUNSELING ASSESSMENT: Jennifer Harrison is a 52 y.o. female with a family history of breast cancer which is somewhat suggestive of a familial breast cancer syndrome and predisposition to cancer. We, therefore, discussed and recommended the following at today's visit.   DISCUSSION: We discussed that about 5-10% of breast cancer is hereditary with most cases due to BRCA mutations.  Other causes of hereditary breast cancer is due to other genes such as ATM, CHEK2 and PALB2.  Based on her family history, the most likely explanation of the breast cancer in the family is due to familial factors, since all cases are over the age of 63.  We reviewed the characteristics, features and inheritance patterns of hereditary cancer syndromes. We also discussed genetic testing, including the appropriate family members to test, the process of testing, insurance coverage and turn-around-time for results. We discussed the implications of a negative, positive and/or variant of uncertain significant result. We recommended Jennifer Harrison pursue genetic testing for the common hereditary cancer gene panel. The Hereditary Gene Panel offered by [facility] includes sequencing and/or deletion duplication testing of the following 47 genes: APC, ATM, AXIN2, BARD1, BMPR1A, BRCA1, BRCA2, BRIP1, CDH1, CDK4, CDKN2A (p14ARF), CDKN2A (p16INK4a), CHEK2, CTNNA1, DICER1, EPCAM (Deletion/duplication testing only), GREM1 (promoter region deletion/duplication testing  only), KIT, MEN1, MLH1, MSH2, MSH3, MSH6, MUTYH, NBN, NF1, NHTL1, PALB2, PDGFRA, PMS2, POLD1, POLE, PTEN, RAD50, RAD51C, RAD51D, SDHB, SDHC, SDHD, SMAD4, SMARCA4. STK11, TP53, TSC1, TSC2, and VHL.  The following genes were evaluated for sequence changes only: SDHA and HOXB13 c.251G>A variant only.   Based on Jennifer Harrison's family history of cancer, she meets medical criteria for genetic testing. Despite that she meets criteria, she may still have an out of pocket cost. We discussed that if her out of pocket cost for testing is over $100, the laboratory will call and confirm whether she wants to proceed with testing.  If the out of pocket cost  of testing is less than $100 she will be billed by the genetic testing laboratory.   Based on the patient's personal and family history, statistical models (Tyrer Cusik)  and literature data were used to estimate her risk of developing breast cancer. This one model estimates her lifetime risk of developing breast cancer to be approximately 34.1%. This estimation does not take into account any genetic testing results.  The patient's lifetime breast cancer risk is a preliminary estimate based on available information using one of several models endorsed by the [place] (ACS). The ACS recommends consideration of breast MRI screening as an adjunct to mammography for patients at high risk (defined as 20% or greater lifetime risk). A more detailed breast cancer risk assessment can be considered, if clinically indicated.   Jennifer Harrison has been determined to be at high risk for breast cancer.  Therefore, we recommend that annual screening with mammography and breast MRI begin at age 65, or 10 years prior to the age of breast cancer diagnosis in a relative (whichever is earlier).  We discussed that Jennifer Harrison should discuss her individual situation with her referring physician and determine a breast cancer screening plan with which they are both comfortable.       PLAN: After considering the risks, benefits, and limitations, Ms. Ragan  provided informed consent to pursue genetic testing and the blood sample was sent to [facility] for analysis of the common hereditary cancer panel. Results should be available within approximately 2-3 weeks' time, at which point they will be disclosed by telephone to Jennifer Harrison, as will any additional recommendations warranted by these results. Jennifer Harrison will receive a summary of her genetic counseling visit and a copy of her results once available. This information will also be available in Epic. We encouraged Jennifer Harrison to remain in contact with cancer genetics annually so that we can continuously update the family history and inform her of any changes in cancer genetics and testing that may be of benefit for her family. Jennifer Harrison questions were answered to her satisfaction today. Our contact information was provided should additional questions or concerns arise.  Lastly, we encouraged Jennifer Harrison to remain in contact with cancer genetics annually so that we can continuously update the family history and inform her of any changes in cancer genetics and testing that may be of benefit for this family.   Ms.  Harrison questions were answered to her satisfaction today. Our contact information was provided should additional questions or concerns arise. Thank you for the referral and allowing [place] to share in the care of your patient.   Karen P. Florene Glen, [place], [facility] Certified Genetic Counselor [email or web address] phone: [phone]  The patient was seen for a total of 45 minutes in face-to-face genetic counseling.  This patient was discussed with Drs. Magrinat, Lindi Adie and/or Burr Medico who agrees with the above.    _______________________________________________________________________ For Office Staff:  Number of people involved in session: 1 Was an Intern/ student involved with case: no

## 2017-12-15 ENCOUNTER — Encounter: Payer: Self-pay | Admitting: Genetic Counselor

## 2017-12-15 ENCOUNTER — Telehealth: Payer: Self-pay | Admitting: Genetic Counselor

## 2017-12-15 DIAGNOSIS — Z1379 Encounter for other screening for genetic and chromosomal anomalies: Secondary | ICD-10-CM | POA: Insufficient documentation

## 2017-12-15 NOTE — Telephone Encounter (Signed)
LM on VM with good news.  Asked that she please CB. 

## 2017-12-16 ENCOUNTER — Ambulatory Visit: Payer: Self-pay | Admitting: Genetic Counselor

## 2017-12-16 DIAGNOSIS — Z1379 Encounter for other screening for genetic and chromosomal anomalies: Secondary | ICD-10-CM

## 2017-12-16 DIAGNOSIS — Z803 Family history of malignant neoplasm of breast: Secondary | ICD-10-CM

## 2017-12-16 NOTE — Telephone Encounter (Signed)
Revealed negative genetic testing.  Discussed that we do not know why there is cancer in the family. It could be due to a different gene that we are not testing, or maybe our current technology may not be able to pick something up.  It will be important for her to keep in contact with genetics to keep up with whether additional testing may be needed.  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  This is still a normal result.

## 2017-12-16 NOTE — Progress Notes (Signed)
HPI:  [name] was previously seen in the [facility] due to a family history of cancer and concerns regarding a hereditary predisposition to cancer. Please refer to our prior cancer genetics clinic note for more information regarding [name]'s medical, social and family histories, and our assessment and recommendations, at the time. [name] recent genetic test results were disclosed to Jennifer Harrison, as were recommendations warranted by these results. These results and recommendations are discussed in more detail below.  CANCER HISTORY:   No history exists.    FAMILY HISTORY:  We obtained a detailed, 4-generation family history.  Significant diagnoses are listed below: Family History  Problem Relation Age of Onset  . Breast cancer Mother 20       died 28; "negative genetic testing"  . Diabetes Father   . Hypertension Father   . Hydrocephalus Brother   . Breast cancer Maternal Grandmother 89       d. 76  . Breast cancer Paternal Grandmother 67       d. 58  . Breast cancer Paternal Aunt 71  . Breast cancer Paternal Aunt 69       "genetic testing negative"  . Other Maternal Grandfather        WWII  . Lymphoma Paternal Grandfather     The patient has one son who is cancer free.  She has a brother and sister who are cancer free.  Jennifer Harrison brother died at 11 from complications of the flu, but had hydrocephalus.  Jennifer Harrison mother died at 62 from breast cancer.  Jennifer Harrison father died at 68.  The patient's mother was diagnosed with breast cancer at 101.  She reportedly had genetic testing that was negative.  She had a brother and sister who are cancer free.  Jennifer Harrison mother developed breast cancer at 21-69 and died at 15 from other causes.  Jennifer Harrison mother's sister had breast cancer over the age of 65.  The patient's maternal grandfather died in WWII.  The patient's father has two sisters who had breast cancer at age 67. One reportedly had genetic testing that was negative. The paternal  grandmother had breast cancer at 60 and the grandfather had lymphoma at 73.  Patient's maternal ancestors are of Caucasian descent, and paternal ancestors are of [place] descent. There is no reported Ashkenazi Jewish ancestry. There is no known consanguinity.  GENETIC TEST RESULTS: Genetic testing reported out on December 09, 2017 through the common hereditary cancer panel found no deleterious mutations.  The Hereditary Gene Panel offered by [facility] includes sequencing and/or deletion duplication testing of the following 47 genes: APC, ATM, AXIN2, BARD1, BMPR1A, BRCA1, BRCA2, BRIP1, CDH1, CDK4, CDKN2A (p14ARF), CDKN2A (p16INK4a), CHEK2, CTNNA1, DICER1, EPCAM (Deletion/duplication testing only), GREM1 (promoter region deletion/duplication testing only), KIT, MEN1, MLH1, MSH2, MSH3, MSH6, MUTYH, NBN, NF1, NHTL1, PALB2, PDGFRA, PMS2, POLD1, POLE, PTEN, RAD50, RAD51C, RAD51D, SDHB, SDHC, SDHD, SMAD4, SMARCA4. STK11, TP53, TSC1, TSC2, and VHL.  The following genes were evaluated for sequence changes only: SDHA and HOXB13 c.251G>A variant only.   The test report has been scanned into EPIC and is located under the Molecular Pathology section of the Results Review tab.    We discussed with [name] that since the current genetic testing is not perfect, it is possible there may be a gene mutation in one of these genes that current testing cannot detect, but that chance is small.  We also discussed, that it is possible that another gene that has not yet been  discovered, or that we have not yet tested, is responsible for the cancer diagnoses in the family, and it is, therefore, important to remain in touch with cancer genetics in the future so that we can continue to offer [name] the most up to date genetic testing.   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 At this time, it is unknown if this variant is associated with increased cancer risk or if  this is a normal finding, but most variants Jennifer Harrison as this get reclassified to being inconsequential. It should not be used to make medical management decisions. With time, we suspect the lab will determine the significance of this variant, if any. If we do learn more about it, we will try to contact [name] to discuss it further. However, it is important to stay in touch with [place] periodically and keep the address and phone number up to date.  CANCER SCREENING RECOMMENDATIONS: This normal result is reassuring and indicates that [name] does not likely have an increased risk of cancer due to a mutation in one of these genes.  We, therefore, recommended  [name] continue to follow the cancer screening guidelines provided by Jennifer Harrison primary healthcare providers.   An individual's cancer risk and medical management are not determined by genetic test results alone. Overall cancer risk assessment incorporates additional factors, including personal medical history, family history, and any available genetic information that may result in a personalized plan for cancer prevention and surveillance.  RECOMMENDATIONS FOR FAMILY MEMBERS:  Women in this family might be at some increased risk of developing cancer, over the general population risk, simply due to the family history of cancer.  We recommended women in this family have a yearly mammogram beginning at age 75, or 38 years younger than the earliest onset of cancer, an annual clinical breast exam, and perform monthly breast self-exams. Women in this family should also have a gynecological exam as recommended by their primary provider. All family members should have a colonoscopy by age 46.  Based on [name]'s family history, we recommended Jennifer Harrison maternal aunts, who were diagnosed with breast cancer at age 50, have genetic counseling and testing. [name] will let us know if we can be of any assistance in coordinating genetic counseling and/or  testing for this family member.   FOLLOW-UP: Lastly, we discussed with [name] that cancer genetics is a rapidly advancing field and it is possible that new genetic tests will be appropriate for Jennifer Harrison and/or Jennifer Harrison family members in the future. We encouraged Jennifer Harrison to remain in contact with cancer genetics on an annual basis so we can update Jennifer Harrison personal and family histories and let Jennifer Harrison know of advances in cancer genetics that may benefit this family.   Our contact number was provided. [name] questions were answered to Jennifer Harrison satisfaction, and she knows she is welcome to call us at anytime with additional questions or concerns.   Roma Kayser, MS, [facility] Certified Genetic Counselor [email or web address]

## 2017-12-17 NOTE — Progress Notes (Signed)
Received the last update. Thank you

## 2017-12-17 NOTE — Progress Notes (Signed)
Thank you for the follow up of this patient. We have set up MRI screening for her.

## 2018-01-04 MED ORDER — PROCHLORPERAZINE MALEATE 10 MG PO TABS
ORAL_TABLET | ORAL | Status: AC
  Filled 2018-01-04: qty 1

## 2018-01-25 ENCOUNTER — Telehealth: Payer: Self-pay | Admitting: Genetic Counselor

## 2018-01-25 NOTE — Telephone Encounter (Signed)
LM on VM to please call back.

## 2018-01-27 NOTE — Telephone Encounter (Signed)
Explained that the benefits investigation found that she would owe $0 OOP based on prevention benefits. Her insurance did not file under this.  The lab will appeal this on her behalf.  Patient does not need to do anything until another EOB comes out.

## 2018-04-01 ENCOUNTER — Other Ambulatory Visit: Payer: Self-pay | Admitting: Certified Nurse Midwife

## 2018-04-01 ENCOUNTER — Ambulatory Visit
Admission: RE | Admit: 2018-04-01 | Discharge: 2018-04-01 | Disposition: A | Discharge status: Home / Self Care | Payer: No Typology Code available for payment source | Source: Ambulatory Visit | Attending: Certified Nurse Midwife | Admitting: Certified Nurse Midwife

## 2018-04-01 DIAGNOSIS — R921 Mammographic calcification found on diagnostic imaging of breast: Secondary | ICD-10-CM

## 2018-05-19 ENCOUNTER — Telehealth: Payer: Self-pay | Admitting: *Deleted

## 2018-05-19 NOTE — Telephone Encounter (Signed)
Left message to call Sharee Pimple at [phone].   Bilateral breast MRI for increased lifetime risk and family hx ordered 10/2017. Patient has not scheduled to date. Per review of notes in Epic, patient to schedule in August with menses. Is patient planning to proceed with MRI?

## 2018-06-07 NOTE — Telephone Encounter (Signed)
Left detailed message, ok per dpr. Advised f/u with recommended breast MRI, please return call to office to notify if plan to proceed with scheduling. May talk with triage or Suzy.

## 2018-06-10 NOTE — Telephone Encounter (Signed)
MRI bilateral breast order placed 10/2017 for increased lifetime risk and family hx of breast cancer. Patient has not scheduled to date, has not returned call to [facility] or [place].   Last screening MMG 09/29/17 Scheduled for bilateral Dx MMG 10/04/18  Dr. Sabra Heck -please advise.  Cc: Melvia Heaps, CNM, [facility]

## 2018-07-13 NOTE — Telephone Encounter (Signed)
Has she had a letter sent

## 2018-07-20 NOTE — Telephone Encounter (Signed)
Routing to [employer]. Encounter closed.  Cc: Jennifer Harrison, CNM

## 2018-07-20 NOTE — Telephone Encounter (Signed)
Pt is clearly aware of recommendations and actually called the breast center but declined to schedule.  Ok to close encounter.

## 2018-07-24 ENCOUNTER — Other Ambulatory Visit: Payer: Self-pay | Admitting: Certified Nurse Midwife

## 2018-07-24 DIAGNOSIS — Z30011 Encounter for initial prescription of contraceptive pills: Secondary | ICD-10-CM

## 2018-07-26 NOTE — Telephone Encounter (Signed)
Medication refill request: Blisovi  Last AEX:  08/06/17 Next AEX: 09/10/18 Last MMG (if hormonal medication request): 04/01/18 Bi-rads 3 Probably Benign  Refill authorized: #[id number] with 0RF

## 2018-08-03 ENCOUNTER — Encounter: Payer: Self-pay | Admitting: Licensed Clinical Social Worker

## 2018-08-03 NOTE — Progress Notes (Signed)
Addendum: The SDHA VUS has been reclassified to "Likely Benign" The report date is 07/17/2018.

## 2018-08-20 ENCOUNTER — Ambulatory Visit: Payer: No Typology Code available for payment source | Admitting: Certified Nurse Midwife

## 2018-09-08 DIAGNOSIS — I2699 Other pulmonary embolism without acute cor pulmonale: Secondary | ICD-10-CM

## 2018-09-08 HISTORY — DX: Other pulmonary embolism without acute cor pulmonale: I26.99

## 2018-09-10 ENCOUNTER — Encounter: Payer: Self-pay | Admitting: Certified Nurse Midwife

## 2018-09-10 ENCOUNTER — Other Ambulatory Visit (HOSPITAL_COMMUNITY)
Admission: RE | Admit: 2018-09-10 | Discharge: 2018-09-10 | Disposition: A | Discharge status: Home / Self Care | Payer: No Typology Code available for payment source | Source: Ambulatory Visit | Attending: Certified Nurse Midwife | Admitting: Certified Nurse Midwife

## 2018-09-10 ENCOUNTER — Other Ambulatory Visit: Payer: Self-pay

## 2018-09-10 ENCOUNTER — Ambulatory Visit: Payer: No Typology Code available for payment source | Admitting: Certified Nurse Midwife

## 2018-09-10 VITALS — BP 124/84 | HR 68 | Resp 16 | Ht 63.75 in | Wt 227.0 lb

## 2018-09-10 DIAGNOSIS — Z124 Encounter for screening for malignant neoplasm of cervix: Secondary | ICD-10-CM | POA: Insufficient documentation

## 2018-09-10 DIAGNOSIS — N912 Amenorrhea, unspecified: Secondary | ICD-10-CM | POA: Diagnosis not present

## 2018-09-10 DIAGNOSIS — Z01419 Encounter for gynecological examination (general) (routine) without abnormal findings: Secondary | ICD-10-CM | POA: Diagnosis not present

## 2018-09-10 DIAGNOSIS — Z3041 Encounter for surveillance of contraceptive pills: Secondary | ICD-10-CM

## 2018-09-10 DIAGNOSIS — R635 Abnormal weight gain: Secondary | ICD-10-CM

## 2018-09-10 DIAGNOSIS — E559 Vitamin D deficiency, unspecified: Secondary | ICD-10-CM

## 2018-09-10 NOTE — Progress Notes (Signed)
53 y.o. G40P1001 Divorced  Caucasian Fe here for annual exam. No Period in last year with continued regular use of OCP. No hot flashes or  Night sweats. Patient was seen at urgent care for URI and had elevated BP. Has been taking at home and has been much better. Has stopped sinus medication and felt this may have affected her BP. Discouraged due to continue weight gain over the past year. Walking some for exercise, but just feels miserable. Would like to be healthier. No PCP just uses Urgent care. No other health issues today. No LMP recorded. (Menstrual status: Oral contraceptives).          Sexually active: Yes.    The current method of family planning is OCP (estrogen/progesterone).    Exercising: Yes.    walking Smoker:  no  Review of Systems  Constitutional:       Weight gain  HENT: Negative.   Eyes: Negative.   Respiratory: Negative.   Cardiovascular: Negative.   Gastrointestinal: Negative.   Genitourinary: Negative.   Musculoskeletal: Negative.        Muscle weakness  Skin: Negative.   Neurological: Negative.   Endo/Heme/Allergies: Negative.   Psychiatric/Behavioral: Negative.     Health Maintenance: Pap:  07-30-16 neg History of Abnormal Pap: no MMG:  Bilateral 1/19 rt breast u./s done, f/u 04-01-18 rt breast category c birads 3:prob benign Self Breast exams: yes Colonoscopy:  none BMD:   none TDaP:  utd Shingles: no Pneumonia: no Hep C and HIV: hep c neg 2016 Labs: if needed   reports that she quit smoking about 25 years ago. She has never used smokeless tobacco. She reports current alcohol use. She reports that she does not use drugs.  Past Medical History:  Diagnosis Date  . Family history of breast cancer     Past Surgical History:  Procedure Laterality Date  . Broken Wrist    . CESAREAN SECTION    . endometrial biopsy  10/15    Current Outpatient Medications  Medication Sig Dispense Refill  . BLISOVI 24 FE 1-20 MG-MCG(24) tablet TAKE 1 TABLET BY MOUTH  DAILY. 84 tablet 0  . CALCIUM PO Take by mouth daily.    . Multiple Vitamin (MULTIVITAMIN) tablet Take 1 tablet by mouth daily.      Marland Kitchen VITAMIN D PO Take by mouth.     No current facility-administered medications for this visit.     Family History  Problem Relation Age of Onset  . Breast cancer Mother 27       died 89; "negative genetic testing"  . Diabetes Father   . Hypertension Father   . Hydrocephalus Brother   . Breast cancer Maternal Grandmother 23       d. 79  . Breast cancer Paternal Grandmother 69       d. 51  . Breast cancer Paternal Aunt 71  . Breast cancer Paternal Aunt 67       "genetic testing negative"  . Other Maternal Grandfather        WWII  . Lymphoma Paternal Grandfather     ROS:  Pertinent items are noted in HPI.  Otherwise, a comprehensive ROS was negative.  Exam:   BP 124/84   Pulse 68   Resp 16   Ht 5' 3.75" (1.619 m)   Wt 227 lb (103 kg)   BMI 39.27 kg/m  Height: 5' 3.75" (161.9 cm) Ht Readings from Last 3 Encounters:  09/10/18 5' 3.75" (1.619 m)  08/06/17  5' 3.25" (1.607 m)  07/30/16 5' 3.25" (1.607 m)    General appearance: alert, cooperative and appears stated age Head: Normocephalic, without obvious abnormality, atraumatic Neck: no adenopathy, supple, symmetrical, trachea midline and thyroid normal to inspection and palpation Lungs: clear to auscultation bilaterally Breasts: normal appearance, no masses or tenderness, No nipple retraction or dimpling, No nipple discharge or bleeding, No axillary or supraclavicular adenopathy Heart: regular rate and rhythm Abdomen: soft, non-tender; no masses,  no organomegaly Extremities: extremities normal, atraumatic, no cyanosis or edema Skin: Skin color, texture, turgor normal. No rashes or lesions Lymph nodes: Cervical, supraclavicular, and axillary nodes normal. No abnormal inguinal nodes palpated Neurologic: Grossly normal   Pelvic: External genitalia:  no lesions              Urethra:   normal appearing urethra with no masses, tenderness or lesions              Bartholin's and Skene's: normal                 Vagina: normal appearing vagina with normal color and discharge, no lesions              Cervix: no cervical motion tenderness, no lesions and normal appearance with red blood noted from cervix,menstrual appearance              Pap taken: Yes.   Bimanual Exam:  Uterus:  normal size, contour, position, consistency, mobility, non-tender and anteverted              Adnexa: normal adnexa and no mass, fullness, tenderness               Rectovaginal: Confirms               Anus:  normal sphincter tone, no lesions  Chaperone present: yes  A:  Well Woman with normal exam  Contraception OCP with amenorrhea x 1 year, but scant amount noted from cervix today  Weight gain  History of elevated BP with OTC sinus medication, checking at home now  Family history of breast cancer  Follow mammogram after probably benign finding due  P:   Reviewed health and wellness pertinent to exam  Discussed stopping OCP and using condoms to see if withdrawal bleeding occurs. Discussed perimenopause and menopause etiology and expectations. Given printed information.  Discussed being off OCP for 2 weeks and doing labs for evaluation for perimenopause, patient agreeable. Will advise if bleeding continues as period. No Rx for OCP given.  Discussed weight gain and exercise. Discussed weight watcher program as a good options. May need to schedule with nutritionist for help with food plan, if BP remains an issues. Encouraged to work on walking 10,000 steps daily. Labs with future labs as above: CMP,Lipid, TSH,Hgb A1-C, Vitamin D  Continue to monitor BP and notify if continues to be elevated, may need PCP referral  Stressed importance of mammogram screening and SBE.  Colon screening due, Discussed risks/benefits with screening. Given Cologard and colonoscopy information and stressed importance of reading  all information and coverage limitations. Declines referral at this point, but has seen Dr. Collene Mares and may call to schedule.  Pap smear: yes   counseled on breast self exam, mammography screening, menopause, adequate intake of calcium and vitamin D, diet and exercise  return annually or prn  An After Visit Summary was printed and given to the patient.

## 2018-09-10 NOTE — Patient Instructions (Signed)

## 2018-09-14 LAB — CYTOLOGY - PAP
DIAGNOSIS: NEGATIVE
HPV (WINDOPATH): NOT DETECTED

## 2018-10-04 ENCOUNTER — Ambulatory Visit
Admission: RE | Admit: 2018-10-04 | Discharge: 2018-10-04 | Disposition: A | Discharge status: Home / Self Care | Payer: No Typology Code available for payment source | Source: Ambulatory Visit | Attending: Certified Nurse Midwife | Admitting: Certified Nurse Midwife

## 2018-10-04 ENCOUNTER — Other Ambulatory Visit: Payer: Self-pay | Admitting: Certified Nurse Midwife

## 2018-10-04 DIAGNOSIS — R921 Mammographic calcification found on diagnostic imaging of breast: Secondary | ICD-10-CM

## 2018-10-08 ENCOUNTER — Ambulatory Visit
Admission: RE | Admit: 2018-10-08 | Discharge: 2018-10-08 | Disposition: A | Discharge status: Home / Self Care | Payer: No Typology Code available for payment source | Source: Ambulatory Visit | Attending: Certified Nurse Midwife | Admitting: Certified Nurse Midwife

## 2018-10-08 DIAGNOSIS — R921 Mammographic calcification found on diagnostic imaging of breast: Secondary | ICD-10-CM

## 2018-10-11 ENCOUNTER — Other Ambulatory Visit: Payer: Self-pay | Admitting: Certified Nurse Midwife

## 2018-10-11 DIAGNOSIS — D0590 Unspecified type of carcinoma in situ of unspecified breast: Secondary | ICD-10-CM

## 2018-10-12 ENCOUNTER — Telehealth: Payer: Self-pay | Admitting: Certified Nurse Midwife

## 2018-10-12 NOTE — Telephone Encounter (Signed)
Called patient to let her know made aware of her recent breast biopsy results with breast cancer noted. She has appointment in February to review management with Oncology team. She discontinued her OCP after our conversation at her last visit 09/10/2018. She reports no bleeding. She also started the Weight Watchers Plan and has lost 7 pounds! Emotionally working through cancer diagnosis.  Discussed we are here for her during this time and will be glad to discuss any other gyn concerns as needed. Patient appreciative to phone call.

## 2018-10-13 ENCOUNTER — Telehealth: Payer: Self-pay | Admitting: Oncology

## 2018-10-13 NOTE — Telephone Encounter (Signed)
Spoke with patient to confirm morning [facility] appointment for 2/12, packet will be emailed to patient

## 2018-10-14 ENCOUNTER — Encounter: Payer: Self-pay | Admitting: *Deleted

## 2018-10-14 ENCOUNTER — Other Ambulatory Visit: Payer: Self-pay | Admitting: Certified Nurse Midwife

## 2018-10-14 ENCOUNTER — Ambulatory Visit
Admission: RE | Admit: 2018-10-14 | Discharge: 2018-10-14 | Disposition: A | Discharge status: Home / Self Care | Payer: No Typology Code available for payment source | Source: Ambulatory Visit | Attending: Certified Nurse Midwife | Admitting: Certified Nurse Midwife

## 2018-10-14 DIAGNOSIS — R921 Mammographic calcification found on diagnostic imaging of breast: Secondary | ICD-10-CM

## 2018-10-14 DIAGNOSIS — D0590 Unspecified type of carcinoma in situ of unspecified breast: Secondary | ICD-10-CM

## 2018-10-14 DIAGNOSIS — C50412 Malignant neoplasm of upper-outer quadrant of left female breast: Secondary | ICD-10-CM

## 2018-10-14 DIAGNOSIS — Z170 Estrogen receptor positive status [ER+]: Secondary | ICD-10-CM | POA: Insufficient documentation

## 2018-10-16 ENCOUNTER — Other Ambulatory Visit: Payer: Self-pay | Admitting: Certified Nurse Midwife

## 2018-10-16 DIAGNOSIS — Z30011 Encounter for initial prescription of contraceptive pills: Secondary | ICD-10-CM

## 2018-10-19 NOTE — Progress Notes (Signed)
[place]  [phone]) [phone] [phone]) [phone]    ID: Jennifer Harrison DOB: 1965/11/01  MR#: [id number]  ACZ#:[id number]  Patient Care Team: Patient, [name] as PCP - General (General Practice) Jennifer Bookbinder, MD as Consulting Physician (General Surgery) Jennifer Harrison, Jennifer Dad, MD as Consulting Physician (Oncology) Jennifer Gibson, MD as Attending Physician (Radiation Oncology) Jennifer Germany, RN as Oncology Nurse Navigator Jennifer Kaufmann, RN as Oncology Nurse Navigator Jennifer Harrison, CNM as Referring Physician (Certified Nurse Midwife) OTHER MD:   CHIEF COMPLAINT: Estrogen receptor positive breast cancer  CURRENT TREATMENT: Definitive surgery pending.   HISTORY OF CURRENT ILLNESS: Jennifer Harrison underwent one-year follow up bilateral diagnostic mammography with tomography for benign right breast calcifications at [facility] on 10/04/2018 showing: Breast Density Category B. There are new calcifications in the upper outer and retroareolar left breast.. Magnification views confirm pleomorphic calcifications spanning approximately 10 cm anterior to posterior diameter, by 8 cm craniocaudal span by approximately 7 cm transverse diameter. While the majority of the calcifications are in the upper-outer quadrant, there are also suspicious calcifications in the central and medial retroareolar left breast, upper inner quadrant. No suspicious mass or distortion is identified in the left breast.   Accordingly on 10/08/2018 she proceeded to biopsies of the left breast areas in question. 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 Prognostic indicators significant for: estrogen receptor, 50% positive with moderate staining intensity and progesterone receptor, 50% positive with moderate-weak staining intensity. Proliferation marker Ki67 at <1%. HER2  negative (1+) by immunohistochemistry.   She underwent an additional left breast biopsy on the same day. The pathology from this retroareolar upper outer quadrant procedure showed ([id number]): ductal carcinoma in situ with calcifications, intermediate grade.  Prognostic panel was not obtained on this tissue.  Then, she underwent an ultrasound of bilateral axillae on 10/14/2018 showing no abnormalities within either axilla. No abnormal lymph nodes are identified.   Magnification views of the right breast show mammographically stable predominately punctate calcifications in the upper inner quadrant, a few of which demonstrate layering. There are additional scattered calcifications in the inner inner right breast. No suspicious mass or architectural distortion on the right.    Finally, she underwent a biopsy of the right breast area in question on 10/14/2018. The pathology from this procedure showed ([id number]): atypical ductal hyperplasia with calcifications, lobular neoplasia (atypical lobular hyperplasia), and fibrocystic changes with calcifications.   The patient's subsequent history is as detailed below.   INTERVAL HISTORY: Jennifer Harrison was evaluated in the multidisciplinary breast cancer clinic on 10/20/2018 accompanied by her husband, Jennifer Harrison.   Her case was also presented at the multidisciplinary breast cancer conference on the same day. At that time a preliminary plan was proposed: Left mastectomy with consideration of right-sided lumpectomy, Oncotype, adjuvant radiation as appropriate, antiestrogens.  Note the patient has had previous genetics testing.  REVIEW OF SYSTEMS: There were no specific symptoms leading to the original mammogram, which was routinely scheduled. Jennifer Harrison is feeling fairly overwhelmed about the situation. The patient denies unusual headaches, visual changes, nausea, vomiting, stiff neck, dizziness, or gait imbalance. There has been no cough, phlegm production, or pleurisy, no  chest pain or pressure, and no change in bowel or bladder habits. The patient denies fever, rash, bleeding, unexplained fatigue or unexplained weight loss. A detailed review of systems was otherwise entirely negative.   PAST MEDICAL HISTORY: Past Medical History:  Diagnosis Date  . Family history of breast cancer      PAST SURGICAL HISTORY: Past Surgical History:  Procedure Laterality Date  . Broken Wrist    . CESAREAN SECTION    . endometrial biopsy  10/15     FAMILY HISTORY: Family History  Problem Relation Age of Onset  . Breast cancer Mother 53       died 60; "negative genetic testing"  . Diabetes Father   . Hypertension Father   . Hydrocephalus Brother   . Breast cancer Maternal Grandmother 49       d. 88  . Breast cancer Paternal Grandmother 31       d. 33  . Breast cancer Paternal Aunt 71  . Breast cancer Paternal Aunt 20       "genetic testing negative"  . Other Maternal Grandfather        WWII  . Lymphoma Paternal Grandfather    [name]'s father died from heart complications following a myocardial infarction at age 65. Patients' mother died from metastatic breast cancer at age 34. The patient has 1 brother and 1 sister. [name]'s mother was diagnosed with breast cancer at age 74. [name]'s paternal grandmother, maternal grandmother, and two paternal aunts were diagnosed with breast cancer. Patient denies anyone in her family having ovarian, prostate, or pancreatic cancer. [name]'s paternal grandfather was diagnosed with lymphoma.    GYNECOLOGIC HISTORY:  No LMP recorded. (Menstrual status: Oral contraceptives). 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 She is divorced. She lives alone with her dog, a Engineer, petroleum mix (150 lbs). Her significant other, [name], works  for the [employer] at a water treatment facility. Anny has one child, [name], who is 27, lives in [place], and graduated from [employer] with a degree in [employer].    ADVANCED DIRECTIVES: Not in place. Hudson was given the appropriate healthcare power of attorney form at the 10/20/2018 visit to fill out and return at her discretion   HEALTH MAINTENANCE: Social History   Tobacco Use  . Smoking status: Former Smoker    Last attempt to quit: 09/08/1993    Years since quitting: 25.1  . Smokeless tobacco: Never Used  Substance Use Topics  . Alcohol use: Yes    Comment: 1-2 a month  . Drug use: No    Colonoscopy: no  PAP: yes, 09/10/2018  Bone density: no   Allergies  Allergen Reactions  . Penicillins   . Sulfa Antibiotics     Current Outpatient Medications  Medication Sig Dispense Refill  . Multiple Vitamin (MULTIVITAMIN) tablet Take 1 tablet by mouth daily.      Marland Kitchen VITAMIN D PO Take by mouth.    . tamoxifen (NOLVADEX) 20 MG tablet Take one tablet daily 90 tablet 12   No current facility-administered medications for this visit.      OBJECTIVE: Middle-aged white woman in no acute distress  Vitals:   10/20/18 0905  BP: (!) 148/100  Pulse: 79  Resp: 18  Temp: 99.1 F (37.3 C)  SpO2: 98%     Body mass index is 38.73 kg/m.   Wt Readings from Last 3 Encounters:  10/20/18 223 lb 14.4 oz (101.6 kg)  09/10/18 227 lb (103 kg)  08/06/17 219 lb (99.3 kg)  ECOG FS:0 - Asymptomatic  Ocular: Sclerae unicteric, pupils round and equal Ear-nose-throat: Oropharynx clear and moist Lymphatic: No cervical or supraclavicular adenopathy Lungs no rales or rhonchi Heart regular rate and rhythm Abd soft, nontender, positive bowel sounds MSK no focal spinal tenderness, no joint edema Neuro: non-focal, well-oriented, appropriate affect Breasts: The right breast is status post recent biopsy as is the left breast.  There are no skin findings of concern.  Both axillae  are benign.   LAB RESULTS:  CMP     Component Value Date/Time   NA 142 10/20/2018 0831   NA 141 08/06/2017 0927   K 4.5 10/20/2018 0831   CL 104 10/20/2018 0831   CO2 29 10/20/2018 0831   GLUCOSE 86 10/20/2018 0831   BUN 11 10/20/2018 0831   BUN 9 08/06/2017 0927   CREATININE 0.80 10/20/2018 0831   CREATININE 0.74 07/30/2016 1049   CALCIUM 9.9 10/20/2018 0831   PROT 7.7 10/20/2018 0831   PROT 7.1 08/06/2017 0927   ALBUMIN 4.4 10/20/2018 0831   ALBUMIN 4.3 08/06/2017 0927   AST 34 10/20/2018 0831   ALT 60 (H) 10/20/2018 0831   ALKPHOS 62 10/20/2018 0831   BILITOT <0.2 (L) 10/20/2018 0831   GFRNONAA >60 10/20/2018 0831   GFRAA >60 10/20/2018 0831    No results found for: TOTALPROTELP, ALBUMINELP, A1GS, A2GS, BETS, BETA2SER, GAMS, MSPIKE, SPEI  No results found for: KPAFRELGTCHN, LAMBDASER, KAPLAMBRATIO  Lab Results  Component Value Date   WBC 9.7 10/20/2018   NEUTROABS 7.3 10/20/2018   HGB 11.9 (L) 10/20/2018   HCT 36.5 10/20/2018   MCV 88.4 10/20/2018   PLT 350 10/20/2018    '@LASTCHEMISTRY'$ @  No results found for: LABCA2  No components found for: [id number]  No results for input(s): INR in the last 168 hours.  No results found for: LABCA2  No results found for: VOZ366  No results found for: YQI347  No results found for: QQV956  No results found for: CA2729  No components found for: HGQUANT  No results found for: CEA1 / No results found for: CEA1   No results found for: AFPTUMOR  No results found for: CHROMOGRNA  No results found for: PSA1  Appointment on 10/20/2018  Component Date Value Ref Range Status  . Sodium 10/20/2018 142  135 - 145 mmol/L Final  . Potassium 10/20/2018 4.5  3.5 - 5.1 mmol/L Final  . Chloride 10/20/2018 104  98 - 111 mmol/L Final  . CO2 10/20/2018 29  22 - 32 mmol/L Final  . Glucose, Bld 10/20/2018 86  70 - 99 mg/dL Final  . BUN 10/20/2018 11  6 - 20 mg/dL Final  . Creatinine 10/20/2018 0.80  0.44 - 1.00 mg/dL Final    . Calcium 10/20/2018 9.9  8.9 - 10.3 mg/dL Final  . Total Protein 10/20/2018 7.7  6.5 - 8.1 g/dL Final  . Albumin 10/20/2018 4.4  3.5 - 5.0 g/dL Final  . AST 10/20/2018 34  15 - 41 U/L Final  . ALT 10/20/2018 60* 0 - 44 U/L Final  . Alkaline Phosphatase 10/20/2018 62  38 - 126 U/L Final  . Total Bilirubin 10/20/2018 <0.2* 0.3 - 1.2 mg/dL Final  . GFR, Est Non Af Am 10/20/2018 >60  >60 mL/min Final  . GFR, Est AFR Am 10/20/2018 >60  >60 mL/min Final  . Anion gap 10/20/2018 9  5 - 15 Final   Performed at [facility], [place] [address], [place], [place] [address]  .  WBC Count 10/20/2018 9.7  4.0 - 10.5 K/uL Final  . RBC 10/20/2018 4.13  3.87 - 5.11 MIL/uL Final  . Hemoglobin 10/20/2018 11.9* 12.0 - 15.0 g/dL Final  . HCT 10/20/2018 36.5  36.0 - 46.0 % Final  . MCV 10/20/2018 88.4  80.0 - 100.0 fL Final  . MCH 10/20/2018 28.8  26.0 - 34.0 pg Final  . MCHC 10/20/2018 32.6  30.0 - 36.0 g/dL Final  . RDW 10/20/2018 12.1  11.5 - 15.5 % Final  . Platelet Count 10/20/2018 350  150 - 400 K/uL Final  . nRBC 10/20/2018 0.0  0.0 - 0.2 % Final  . Neutrophils Relative % 10/20/2018 75  % Final  . Neutro Abs 10/20/2018 7.3  1.7 - 7.7 K/uL Final  . Lymphocytes Relative 10/20/2018 14  % Final  . Lymphs Abs 10/20/2018 1.3  0.7 - 4.0 K/uL Final  . Monocytes Relative 10/20/2018 8  % Final  . Monocytes Absolute 10/20/2018 0.8  0.1 - 1.0 K/uL Final  . Eosinophils Relative 10/20/2018 3  % Final  . Eosinophils Absolute 10/20/2018 0.3  0.0 - 0.5 K/uL Final  . Basophils Relative 10/20/2018 0  % Final  . Basophils Absolute 10/20/2018 0.0  0.0 - 0.1 K/uL Final  . Immature Granulocytes 10/20/2018 0  % Final  . Abs Immature Granulocytes 10/20/2018 0.04  0.00 - 0.07 K/uL Final   Performed at [facility], [place] [address], [place], [place] [address]    (this displays the last labs from the last 3 days)  No results found for: TOTALPROTELP, ALBUMINELP, A1GS, A2GS,  BETS, BETA2SER, GAMS, MSPIKE, SPEI (this displays SPEP labs)  No results found for: KPAFRELGTCHN, LAMBDASER, KAPLAMBRATIO (kappa/lambda light chains)  No results found for: HGBA, HGBA2QUANT, HGBFQUANT, HGBSQUAN (Hemoglobinopathy evaluation)   No results found for: LDH  Lab Results  Component Value Date   IRON 65 07/30/2016   TIBC 439 07/30/2016   IRONPCTSAT 15 07/30/2016   (Iron and TIBC)  Lab Results  Component Value Date   FERRITIN 33 07/30/2016    Urinalysis    Component Value Date/Time   COLORURINE YELLOW 07/29/2013 1501   APPEARANCEUR CLEAR 07/29/2013 1501   LABSPEC 1.005 07/29/2013 1501   PHURINE 6.5 07/29/2013 1501   GLUCOSEU NEG 07/29/2013 1501   HGBUR NEG 07/29/2013 1501   BILIRUBINUR n 07/30/2016 0840   KETONESUR NEG 07/29/2013 1501   PROTEINUR n 07/30/2016 0840   PROTEINUR NEG 07/29/2013 1501   UROBILINOGEN negative 07/30/2016 0840   UROBILINOGEN 0.2 07/29/2013 1501   NITRITE n 07/30/2016 0840   NITRITE NEG 07/29/2013 1501   LEUKOCYTESUR Negative 07/30/2016 0840     STUDIES:  Mm Diag Breast Tomo Bilateral  Result Date: 10/04/2018 CLINICAL DATA:  53 year old patient presents for 1 year follow-up of probably benign right breast calcifications and annual examination of both breasts. She has had 1 month duration of diffuse lateral breast pain, most prominent in the 3 o'clock region. She has been wearing a new underwire bra and is unsure if this may be a cause of the pain. She has a family history of breast cancer in her maternal grandmother, her mother, her paternal grandmother, and 2 paternal aunts. EXAM: DIGITAL DIAGNOSTIC BILATERAL MAMMOGRAM WITH CAD AND TOMO COMPARISON:  Previous exam(s). ACR Breast Density Category b: There are scattered areas of fibroglandular density. FINDINGS: There are new calcifications in the upper outer and retroareolar left breast which are evaluated with magnification views. Magnification views confirm pleomorphic calcifications  spanning  approximately 10 cm anterior to posterior diameter, by 8 cm craniocaudal span by approximately 7 cm transverse diameter. While the majority of the calcifications are in the upper-outer quadrant, there are also suspicious calcifications in the central and medial retroareolar left breast, upper inner quadrant. No suspicious mass or distortion is identified in the left breast. Magnification views of the right breast show mammographically stable predominantly punctate calcifications in the upper inner quadrant, a few of which demonstrate layering. There are additional scattered calcifications in the upper inner right breast. No suspicious mass or architectural distortion on the right. Mammographic images were processed with CAD. IMPRESSION: New malignant type calcifications involving large portion of the superior left breast as described above. Stable punctate calcifications in the upper inner right breast. Strong family history of breast cancer. RECOMMENDATION: Two stereotactic biopsies are recommended of calcifications in the left breast, including the most dominant cluster of pleomorphic calcifications in the slightly outer and upper left breast, posterior third, and of calcifications in the retroareolar left breast. These biopsies have been scheduled on Oct 08, 2018. I discussed with the patient the option of a stereotactic biopsy of the stable right breast calcifications given the suspicious findings on the left. She is scheduled for a right breast stereotactic biopsy on Oct 22, 2018. I have discussed the findings and recommendations with the patient. Results were also provided in writing at the conclusion of the visit. If applicable, a reminder letter will be sent to the patient regarding the next appointment. BI-RADS CATEGORY  5: Highly suggestive of malignancy. Electronically Signed   By: Curlene Dolphin M.D.   On: 10/04/2018 16:54   [place] Axilla Left  Result Date: 10/14/2018 CLINICAL DATA:  53 year old  female for evaluation of bilateral axillary lymph nodes. Recent diagnosis of LEFT breast invasive cancer and pending biopsy of RIGHT breast calcifications. EXAM: ULTRASOUND OF THE BILATERAL AXILLA COMPARISON:  None FINDINGS: Ultrasound is performed, showing no abnormalities within either axilla. No abnormal lymph nodes are identified. IMPRESSION: No abnormal appearing lymph nodes within either axillary regions. RECOMMENDATION: Treatment plan for LEFT breast cancer. I have discussed the findings and recommendations with the patient. Results were also provided in writing at the conclusion of the visit. If applicable, a reminder letter will be sent to the patient regarding the next appointment. BI-RADS CATEGORY  1: Negative. Electronically Signed   By: Margarette Canada M.D.   On: 10/14/2018 11:51   [place] Axilla Right  Result Date: 10/14/2018 CLINICAL DATA:  53 year old female for evaluation of bilateral axillary lymph nodes. Recent diagnosis of LEFT breast invasive cancer and pending biopsy of RIGHT breast calcifications. EXAM: ULTRASOUND OF THE BILATERAL AXILLA COMPARISON:  None FINDINGS: Ultrasound is performed, showing no abnormalities within either axilla. No abnormal lymph nodes are identified. IMPRESSION: No abnormal appearing lymph nodes within either axillary regions. RECOMMENDATION: Treatment plan for LEFT breast cancer. I have discussed the findings and recommendations with the patient. Results were also provided in writing at the conclusion of the visit. If applicable, a reminder letter will be sent to the patient regarding the next appointment. BI-RADS CATEGORY  1: Negative. Electronically Signed   By: Margarette Canada M.D.   On: 10/14/2018 11:51   Mm Clip Placement Left  Result Date: 10/08/2018 CLINICAL DATA:  Evaluate placement of biopsy clips following 2 separate stereotactic guided LEFT breast biopsies. EXAM: DIAGNOSTIC LEFT MAMMOGRAM POST STEREOTACTIC BIOPSY COMPARISON:  Previous exam(s). FINDINGS:  Mammographic images were obtained following stereotactic guided biopsy of calcifications within the posterior UPPER OUTER  LEFT breast and calcifications within the RETROAREOLAR UPPER-OUTER LEFT breast. The COIL clip is in satisfactory position corresponding to the calcifications biopsied in the posterior UPPER-OUTER LEFT breast. The X clip is in satisfactory position corresponding to the calcifications biopsied in the RETROAREOLAR UPPER-OUTER LEFT breast. The 2 clips are separated by a distance of 7.2 cm on the LATERAL view. IMPRESSION: Satisfactory position of COIL and X biopsy clips following stereotactic guided LEFT breast biopsies. The 2 clips are separated by a distance of 7.2 cm on the LATERAL view. Final Assessment: Post Procedure Mammograms for Marker Placement Electronically Signed   By: Margarette Canada M.D.   On: 10/08/2018 10:34   Mm Clip Placement Right  Result Date: 10/14/2018 CLINICAL DATA:  Evaluate clip placement following stereotactic guided RIGHT breast biopsy. EXAM: DIAGNOSTIC RIGHT MAMMOGRAM POST STEREOTACTIC BIOPSY COMPARISON:  Previous exam(s). FINDINGS: Mammographic images were obtained following stereotactic guided biopsy of calcifications within the UPPER INNER RIGHT breast. The COIL clip is in satisfactory position. IMPRESSION: Satisfactory COIL clip position following stereotactic guided RIGHT breast biopsy. Final Assessment: Post Procedure Mammograms for Marker Placement Electronically Signed   By: Margarette Canada M.D.   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 INTERMEDIATE GRADE DUCTAL CARCINOMA IN SITU WITH CALCIFICATIONS of the LEFT breast, retroareolar upper outer. This was found to be concordant by Dr. Hassan Rowan. Pathology results  were discussed with the patient by telephone. The patient reported doing well after the biopsies with tenderness and swelling at the sites. Post biopsy instructions and care were reviewed and questions were answered. The patient was encouraged to call [facility] for any additional concerns. The patient was referred to [facility] on October 20, 2018. The patient is scheduled for a LEFT axillary ultrasound and RIGHT breast stereotatic biopsy at [facility] on October 14, 2018. Pathology results reported by Terie Purser, RN on 10/11/2018. Electronically Signed   By: Margarette Canada M.D.   On: 10/11/2018 12:05   Result Date: 10/12/2018 CLINICAL DATA:  53 year old female for tissue sampling of suspicious calcifications in the posterior UPPER-OUTER LEFT breast and of suspicious calcifications in the RETROAREOLAR UPPER-OUTER LEFT breast. EXAM: LEFT BREAST STEREOTACTIC CORE NEEDLE BIOPSY X 2 COMPARISON:  Previous exams. FINDINGS: The patient and I discussed the procedure of stereotactic-guided biopsies including benefits and alternatives. We discussed the high likelihood of successful procedures. We discussed the risks of the procedures including infection, bleeding, tissue injury, clip migration, and inadequate sampling. Informed written consent was given. The usual time out protocol was performed immediately prior to the procedures. STEREOTACTIC BIOPSY OF POSTERIOR UPPER-OUTER LEFT BREAST CALCIFICATIONS (COIL clip): Using sterile technique and 1% Lidocaine as local anesthetic, under stereotactic guidance, a 9 gauge vacuum assisted device was used to perform core needle biopsy of calcifications in the posterior UPPER-OUTER LEFT breast using a SUPERIOR approach. Specimen radiograph was performed showing calcifications. Specimens with calcifications are identified for pathology. Lesion quadrant: UPPER-OUTER LEFT breast  At the conclusion of the procedure, a COIL tissue marker clip was deployed into the biopsy cavity. Follow-up 2-view mammogram was performed and dictated separately. STEREOTACTIC BIOPSY OF RETROAREOLAR UPPER-OUTER LEFT BREAST CALCIFICATIONS (X clip): Using sterile technique and  1% Lidocaine as local anesthetic, under stereotactic guidance, a 9 gauge vacuum assisted device was used to perform core needle biopsy of calcifications in the RETROAREOLAR UPPER-OUTER LEFT breast using a SUPERIOR approach. Specimen radiograph was performed showing calcification. Specimens with calcifications are identified for pathology. Lesion quadrant: RETROAREOLAR UPPER-OUTER LEFT breast At the conclusion of the procedure, a X tissue marker clip was deployed into the biopsy cavity. Follow-up 2-view mammogram was performed and dictated separately. IMPRESSION: Stereotactic-guided biopsy of 2 separate areas of calcifications within the UPPER-OUTER LEFT breast. No apparent complications. Electronically Signed: By: Margarette Canada M.D. On: 10/08/2018 10:35   Mm Lt Breast Bx W Loc Dev Ea Ad Lesion Img Bx Spec Stereo Guide  Addendum Date: 10/12/2018   ADDENDUM REPORT: 10/11/2018 12:05 ADDENDUM: Pathology revealed GRADE I INVASIVE DUCTAL CARCINOMA, DUCTAL CARCINOMA IN SITU WITH CALCIFICATIONS of the LEFT breast, posterior upper outer. INTERMEDIATE GRADE DUCTAL CARCINOMA IN SITU WITH CALCIFICATIONS of the LEFT breast, retroareolar upper outer. This was found to be concordant by Dr. Hassan Rowan. Pathology results were discussed with the patient by telephone. The patient reported doing well after the biopsies with tenderness and swelling at the sites. Post biopsy instructions and care were reviewed and questions were answered. The patient was encouraged to call [facility] for any additional concerns. The patient was referred to [facility] on October 20, 2018. The patient is scheduled for a LEFT axillary ultrasound and RIGHT breast stereotatic biopsy at [facility] on October 14, 2018. Pathology results reported by Terie Purser, RN on 10/11/2018. Electronically Signed   By: Margarette Canada M.D.   On: 10/11/2018 12:05   Result Date: 10/12/2018 CLINICAL DATA:  53 year old female for tissue sampling of suspicious calcifications in the posterior UPPER-OUTER LEFT breast and of suspicious calcifications in the RETROAREOLAR UPPER-OUTER LEFT breast. EXAM: LEFT BREAST STEREOTACTIC CORE NEEDLE BIOPSY X 2 COMPARISON:  Previous exams. FINDINGS: The patient and I discussed the procedure of stereotactic-guided biopsies including benefits and alternatives. We discussed the high likelihood of successful procedures. We discussed the risks of the procedures including infection, bleeding, tissue injury, clip migration, and inadequate sampling. Informed written consent was given. The usual time out protocol was performed immediately prior to the procedures. STEREOTACTIC BIOPSY OF POSTERIOR UPPER-OUTER LEFT BREAST CALCIFICATIONS (COIL clip): Using sterile technique and 1% Lidocaine as local anesthetic, under stereotactic guidance, a 9 gauge vacuum assisted device was used to perform core needle biopsy of calcifications in the posterior UPPER-OUTER LEFT breast using a SUPERIOR approach. Specimen radiograph was performed showing calcifications. Specimens with calcifications are identified for pathology. Lesion quadrant: UPPER-OUTER LEFT breast At the conclusion of the procedure, a COIL tissue marker clip was deployed into the biopsy cavity. Follow-up 2-view mammogram was performed and dictated separately. STEREOTACTIC BIOPSY OF RETROAREOLAR UPPER-OUTER LEFT BREAST CALCIFICATIONS (X clip): Using sterile technique and 1% Lidocaine as local anesthetic, under stereotactic guidance, a 9 gauge vacuum assisted device was used to perform core needle biopsy of calcifications in the  RETROAREOLAR UPPER-OUTER LEFT breast using a SUPERIOR approach. Specimen radiograph was performed showing calcification. Specimens with calcifications are identified for pathology. Lesion quadrant: RETROAREOLAR UPPER-OUTER LEFT breast At the conclusion of the procedure, a X tissue marker clip was deployed into the biopsy cavity. Follow-up 2-view mammogram was performed and dictated separately. IMPRESSION: Stereotactic-guided biopsy of 2 separate areas of calcifications within the UPPER-OUTER LEFT breast. No apparent complications. Electronically Signed: By: Dellis Filbert  Hu M.D. On: 10/08/2018 10:35   Mm Rt Breast Bx W Loc Dev 1st Lesion Image Bx Spec Stereo Guide  Addendum Date: 10/15/2018   ADDENDUM REPORT: 10/15/2018 14:39 ADDENDUM: Pathology revealed ATYPICAL DUCTAL HYPERPLASIA WITH CALCIFICATIONS, LOBULAR NEOPLASIA (ATYPICAL LOBULAR HYPERPLASIA), FIBROCYSTIC CHANGES WITH CALCIFICATIONS of the RIGHT breast, upper inner. This was found to be concordant by Dr. Hassan Rowan, with excision recommended. Pathology results were discussed with the patient by telephone. The patient reported doing well after the biopsy with tenderness at the site. Post biopsy instructions and care were reviewed and questions were answered. The patient was encouraged to call [facility] for any additional concerns. The patient has a recent diagnosis of LEFT breast cancer and should follow her outlined treatment plan. The patient was referred to [facility] at [facility] on October 20, 2018. Pathology results reported by Terie Purser, RN on 10/15/2018. Electronically Signed   By: Margarette Canada M.D.   On: 10/15/2018 14:39   Result Date: 10/15/2018 CLINICAL DATA:  53 year old female for tissue sampling of UPPER INNER RIGHT breast calcifications. Recent diagnosis of LEFT breast cancer. EXAM: RIGHT BREAST STEREOTACTIC CORE NEEDLE BIOPSY COMPARISON:  Previous exams.  FINDINGS: The patient and I discussed the procedure of stereotactic-guided biopsy including benefits and alternatives. We discussed the high likelihood of a successful procedure. We discussed the risks of the procedure including infection, bleeding, tissue injury, clip migration, and inadequate sampling. Informed written consent was given. The usual time out protocol was performed immediately prior to the procedure. Using sterile technique and 1% Lidocaine as local anesthetic, under stereotactic guidance, a 9 gauge vacuum assisted device was used to perform core needle biopsy of calcifications within the UPPER INNER quadrant of the RIGHT breast using a SUPERIOR approach. Specimen radiograph was performed showing calcifications. Specimens with calcifications are identified for pathology. Lesion quadrant: UPPER INNER RIGHT breast At the conclusion of the procedure, a COIL tissue marker clip was deployed into the biopsy cavity. Follow-up 2-view mammogram was performed and dictated separately. IMPRESSION: Stereotactic-guided biopsy of UPPER INNER RIGHT breast calcifications. No apparent complications. Electronically Signed: By: Margarette Canada M.D. On: 10/14/2018 11:45     ELIGIBLE FOR AVAILABLE RESEARCH PROTOCOL: no   ASSESSMENT: 53 y.o. [place], [place] status post left breast upper outer quadrant biopsy 10/08/2018 for a clinical [place] N0 invasive ductal carcinoma, grade 1, estrogen and progesterone receptor positive, HER-2 nonamplified, with an MIB-1 of less than 1%  (1) status post right breast upper inner quadrant biopsy 10/14/2018 showing atypical ductal hyperplasia and atypical lobular hyperplasia  (2) tamoxifen started neoadjuvantly 10/20/2018   (3) definitive surgery pending: Patient opted for bilateral mastectomies with reconstruction  (4) Oncotype to be obtained from the definitive surgical sample  (5) adjuvant radiation as appropriate  (6) Genetic testing through [facility] Common Hereditary Cancers Panel  completed on 12/03/2017 showed no deleterious mutations in: APC, ATM, AXIN2, BARD1, BMPR1A, BRCA1, BRCA2, BRIP1, CDH1, CDK4, CDKN2A (p14ARF), CDKN2A (p16INK4a), CHEK2, CTNNA1, DICER1, EPCAM*, GREM1*, KIT, MEN1, MLH1, MSH2, MSH3, MSH6, MUTYH, NBN, NF1, PALB2, PDGFRA, PMS2, POLD1, POLE, PTEN, RAD50, RAD51C, RAD51D, SDHB, SDHC, SDHD, SMAD4, SMARCA4, STK11, TP53, TSC1, TSC2, VHL The following genes were evaluated for sequence changes only: HOXB13*, NTHL1*, SDHA.  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   PLAN: I spent approximately 60 minutes face to face with Makya with more than 50% of that time spent in counseling and coordination  of care. Specifically we reviewed the biology of the patient's diagnosis and the specifics of her situation.  We first reviewed the fact that cancer is not one disease but more than 100 different diseases and that it is important to keep them separate-- otherwise when friends and relatives discuss their own cancer experiences with Veena confusion can result. Similarly we explained that if breast cancer spreads to the bone or liver, the patient would not have bone cancer or liver cancer, but breast cancer in the bone and breast cancer in the liver: one cancer in three places-- not 3 different cancers which otherwise would have to be treated in 3 different ways.  We discussed the difference between local and systemic therapy. In terms of loco-regional treatment, lumpectomy plus radiation is equivalent to mastectomy as far as survival is concerned.  However, given the extent of calcifications in the left breast mastectomy likely cannot be avoided.  Given that and the concern regarding precancerous lesions in the right breast, the patient has opted for bilateral mastectomies.  She desires reconstruction.  We also noted that in terms of sequencing of treatments, whether systemic therapy or surgery is done first does not affect the  ultimate outcome.  This is relevant to Devyn's case, since there may be significant surgical delays given her choice of bilateral mastectomies and reconstruction.  It would be prudent to start antiestrogens now while surgery is being operational lysed  We then discussed the rationale for systemic therapy. There is some risk that this cancer may have already spread to other parts of her body. Patients frequently ask at this point about bone scans, CAT scans and PET scans to find out if they have occult breast cancer somewhere else. The problem is that in early stage disease we are much more likely to find false positives then true cancers and this would expose the patient to unnecessary procedures as well as unnecessary radiation. Scans cannot answer the question the patient really would like to know, which is whether she has microscopic disease elsewhere in her body. For those reasons we do not recommend them.  Of course we would proceed to aggressive evaluation of any symptoms that might suggest metastatic disease, but that is not the case here.  Next we went over the options for systemic therapy which are anti-estrogens, anti-HER-2 immunotherapy, and chemotherapy. Ahnesty does not meet criteria for anti-HER-2 immunotherapy. She is a good candidate for anti-estrogens.  The question of chemotherapy is more complicated. Chemotherapy is most effective in rapidly growing, aggressive tumors. It is much less effective in low-grade, slow growing cancers, like Luella 's. For that reason we are going to request an Oncotype from the definitive surgical sample, as suggested by NCCN guidelines. That will help [place] make a definitive decision regarding chemotherapy in this case.  Finally, we discussed tamoxifen.  She has a good understanding of the possible toxicity side effects and complications of this agent.  She will started now and she will see me in a few weeks to make sure she is tolerating it well.  Laura-Lee has  a good understanding of the overall plan. She agrees with it. She knows the goal of treatment in her case is cure. She will call with any problems that may develop before her next visit here.   Sahra Converse, Jennifer Dad, MD  10/20/18 5:08 PM Medical Oncology and Hematology [facility] [address] [place], [place] [address] Tel. [phone]    Fax. [phone]    I,  [employer] am acting as a Education administrator for Chauncey Cruel, MD.   I, Lurline Del MD, have reviewed the above documentation for accuracy and completeness, and I agree with the above.

## 2018-10-20 ENCOUNTER — Encounter: Payer: Self-pay | Admitting: Physical Therapy

## 2018-10-20 ENCOUNTER — Ambulatory Visit: Payer: No Typology Code available for payment source | Attending: General Surgery | Admitting: Physical Therapy

## 2018-10-20 ENCOUNTER — Ambulatory Visit
Admission: RE | Admit: 2018-10-20 | Discharge: 2018-10-20 | Disposition: A | Discharge status: Home / Self Care | Payer: No Typology Code available for payment source | Source: Ambulatory Visit | Attending: Radiation Oncology | Admitting: Radiation Oncology

## 2018-10-20 ENCOUNTER — Encounter: Payer: Self-pay | Admitting: Radiation Oncology

## 2018-10-20 ENCOUNTER — Encounter: Payer: Self-pay | Admitting: Oncology

## 2018-10-20 ENCOUNTER — Inpatient Hospital Stay: Payer: No Typology Code available for payment source

## 2018-10-20 ENCOUNTER — Inpatient Hospital Stay: Payer: No Typology Code available for payment source | Attending: Oncology | Admitting: Oncology

## 2018-10-20 ENCOUNTER — Other Ambulatory Visit: Payer: Self-pay

## 2018-10-20 VITALS — BP 148/100 | HR 79 | Temp 99.1°F | Resp 18 | Ht 63.75 in | Wt 223.9 lb

## 2018-10-20 DIAGNOSIS — C50412 Malignant neoplasm of upper-outer quadrant of left female breast: Secondary | ICD-10-CM

## 2018-10-20 DIAGNOSIS — Z170 Estrogen receptor positive status [ER+]: Secondary | ICD-10-CM

## 2018-10-20 DIAGNOSIS — R293 Abnormal posture: Secondary | ICD-10-CM | POA: Diagnosis present

## 2018-10-20 DIAGNOSIS — Z79810 Long term (current) use of selective estrogen receptor modulators (SERMs): Secondary | ICD-10-CM | POA: Insufficient documentation

## 2018-10-20 DIAGNOSIS — Z803 Family history of malignant neoplasm of breast: Secondary | ICD-10-CM | POA: Diagnosis not present

## 2018-10-20 DIAGNOSIS — Z87891 Personal history of nicotine dependence: Secondary | ICD-10-CM | POA: Diagnosis not present

## 2018-10-20 LAB — CMP (CANCER CENTER ONLY)
ALBUMIN: 4.4 g/dL (ref 3.5–5.0)
ALT: 60 U/L — ABNORMAL HIGH (ref 0–44)
AST: 34 U/L (ref 15–41)
Alkaline Phosphatase: 62 U/L (ref 38–126)
Anion gap: 9 (ref 5–15)
BUN: 11 mg/dL (ref 6–20)
CALCIUM: 9.9 mg/dL (ref 8.9–10.3)
CO2: 29 mmol/L (ref 22–32)
Chloride: 104 mmol/L (ref 98–111)
Creatinine: 0.8 mg/dL (ref 0.44–1.00)
GFR, Est AFR Am: >60 mL/min (ref >60)
GFR, Estimated: >60 mL/min (ref >60)
Glucose, Bld: 86 mg/dL (ref 70–99)
Potassium: 4.5 mmol/L (ref 3.5–5.1)
Sodium: 142 mmol/L (ref 135–145)
Total Protein: 7.7 g/dL (ref 6.5–8.1)

## 2018-10-20 LAB — CBC WITH DIFFERENTIAL (CANCER CENTER ONLY)
Abs Immature Granulocytes: 0.04 10*3/uL (ref 0.00–0.07)
Basophils Absolute: 0 10*3/uL (ref 0.0–0.1)
Basophils Relative: 0 %
Eosinophils Absolute: 0.3 10*3/uL (ref 0.0–0.5)
Eosinophils Relative: 3 %
HCT: 36.5 % (ref 36.0–46.0)
Hemoglobin: 11.9 g/dL — ABNORMAL LOW (ref 12.0–15.0)
IMMATURE GRANULOCYTES: 0 %
Lymphocytes Relative: 14 %
Lymphs Abs: 1.3 10*3/uL (ref 0.7–4.0)
MCH: 28.8 pg (ref 26.0–34.0)
MCHC: 32.6 g/dL (ref 30.0–36.0)
MCV: 88.4 fL (ref 80.0–100.0)
MONOS PCT: 8 %
Monocytes Absolute: 0.8 10*3/uL (ref 0.1–1.0)
NEUTROS ABS: 7.3 10*3/uL (ref 1.7–7.7)
Neutrophils Relative %: 75 %
Platelet Count: 350 10*3/uL (ref 150–400)
RBC: 4.13 MIL/uL (ref 3.87–5.11)
RDW: 12.1 % (ref 11.5–15.5)
WBC Count: 9.7 10*3/uL (ref 4.0–10.5)
nRBC: 0 % (ref 0.0–0.2)

## 2018-10-20 MED ORDER — TAMOXIFEN CITRATE 20 MG PO TABS: ORAL_TABLET | ORAL | 12 refills | Status: DC

## 2018-10-20 NOTE — Progress Notes (Signed)
Nutrition Assessment  Reason for Assessment:  Pt seen in Breast Clinic  ASSESSMENT:  53 year old female with new diagnosis of breast cancer.  Past medical history reviewed.    Patient reports normal diet.  Medications:  reviewed  Labs: reviewed  Anthropometrics:   Height: 63 inches Weight: 223 lb BMI: 38   NUTRITION DIAGNOSIS: Food and nutrition related knowledge deficit related to new diagnosis of breast cancer as evidenced by no prior need for nutrition related information.  INTERVENTION:   Discussed and provided packet of information regarding nutritional tips for breast cancer patients.  Questions answered.  Teachback method used.  Contact information provided and patient knows to contact me with questions/concerns.    MONITORING, EVALUATION, and GOAL: Pt will consume a healthy plant based diet to maintain lean body mass throughout treatment.   Garik Diamant B. Zenia Resides, [place], [place] Registered Dietitian [phone] (pager)

## 2018-10-20 NOTE — Patient Instructions (Signed)

## 2018-10-20 NOTE — Progress Notes (Signed)
Radiation Oncology         [phone] ________________________________  Initial outpatient Consultation  Name: Jennifer Harrison MRN: [id number]  Date: 10/20/2018  DOB: 11-16-1965  BT:DVVOHYW, [name]  No ref. provider found   REFERRING PHYSICIAN: No ref. provider found  DIAGNOSIS:    ICD-10-CM   1. Malignant neoplasm of upper-outer quadrant of left breast in female, estrogen receptor positive ([place]) C50.412    Z17.0    Cancer Staging Malignant neoplasm of upper-outer quadrant of left breast in female, estrogen receptor positive ([place]) Staging form: Breast, AJCC 8th Edition - Clinical stage from 10/20/2018: Stage Unknown (cTX, cN0, cM0, G1, ER+, PR+, HER2-) - Unsigned   CHIEF COMPLAINT: Here to discuss management of left breast cancer  HISTORY OF PRESENT ILLNESS::Jennifer Harrison is a 53 y.o. female who presented with breast abnormality on the following imaging: several centimeters of upper left breast calcifications on mammography.  Symptoms, if any, at that time, were: none.  She also showed right breast UIQ calcifications that has been followed and were somewhat suspicious.  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  She has a significant family history. Genetic testing was negative a year ago. She has post biopsy breast pain.  PREVIOUS RADIATION THERAPY: No  PAST MEDICAL HISTORY:  has a past medical history of Family history of breast cancer.    PAST SURGICAL HISTORY: Past Surgical History:  Procedure Laterality Date  . Broken Wrist    . CESAREAN SECTION    . endometrial biopsy  10/15    FAMILY HISTORY: family history includes Breast cancer (age of onset: 61) in her mother; Breast cancer (age of onset: 35) in her maternal grandmother; Breast cancer (age of onset: 45) in her paternal  grandmother; Breast cancer (age of onset: 56) in her paternal aunt and paternal aunt; Diabetes in her father; Hydrocephalus in her brother; Hypertension in her father; Lymphoma in her paternal grandfather; Other in her maternal grandfather.  SOCIAL HISTORY:  reports that she quit smoking about 25 years ago. She has never used smokeless tobacco. She reports current alcohol use. She reports that she does not use drugs.  ALLERGIES: Penicillins and Sulfa antibiotics  MEDICATIONS:  Current Outpatient Medications  Medication Sig Dispense Refill  . Multiple Vitamin (MULTIVITAMIN) tablet Take 1 tablet by mouth daily.      . tamoxifen (NOLVADEX) 20 MG tablet Take one tablet daily 90 tablet 12  . VITAMIN D PO Take by mouth.     No current facility-administered medications for this encounter.     REVIEW OF SYSTEMS: A 10+ POINT REVIEW OF SYSTEMS WAS OBTAINED including neurology, dermatology, psychiatry, cardiac, respiratory, lymph, extremities, GI, GU, Musculoskeletal, constitutional, breasts, reproductive, HEENT.  All pertinent positives are noted in the HPI.  All others are negative.   PHYSICAL EXAM:   Vitals - 1 value per visit 7/37/1062  SYSTOLIC 694  DIASTOLIC 854  Pulse 79  Temperature 99.1  Respirations 18  Weight (lb) 223.9  Height 5' 3.75"  BMI 38.73  VISIT REPORT    General: Alert and oriented, in no acute distress HEENT: Head is normocephalic. Extraocular movements are intact. Oropharynx is clear. Neck: Neck is supple, no palpable cervical or supraclavicular lymphadenopathy. Heart: Regular in rate and rhythm with no murmurs, rubs, or gallops. Chest: Clear to  auscultation bilaterally, with no rhonchi, wheezes, or rales. Abdomen: Soft, nontender, nondistended, with no rigidity or guarding. Extremities: No cyanosis or edema. Lymphatics: see Neck Exam Skin: No concerning lesions. Musculoskeletal: symmetric strength and muscle tone throughout. Neurologic: Cranial nerves II through  XII are grossly intact. No obvious focalities. Speech is fluent. Coordination is intact. Psychiatric: Judgment and insight are intact. Affect is appropriate. Breasts: Palpable thickening throughout upper left breast.  Palpable thickening in UIQ of right breast. No other palpable masses appreciated in the breasts or axillae     ECOG = 0  0 - Asymptomatic (Fully active, able to carry on all predisease activities without restriction)  1 - Symptomatic but completely ambulatory (Restricted in physically strenuous activity but ambulatory and able to carry out work of a light or sedentary nature. For example, light housework, office work)  2 - Symptomatic, <50% in bed during the day (Ambulatory and capable of all self care but unable to carry out any work activities. Up and about more than 50% of waking hours)  3 - Symptomatic, >50% in bed, but not bedbound (Capable of only limited self-care, confined to bed or chair 50% or more of waking hours)  4 - Bedbound (Completely disabled. Cannot carry on any self-care. Totally confined to bed or chair)  5 - Death   Eustace Pen MM, Creech RH, Tormey DC, et al. [phone]). "Toxicity and response criteria of the [facility]". [place] Oncol. 5 (6): 649-55   LABORATORY DATA:  Lab Results  Component Value Date   WBC 9.7 10/20/2018   HGB 11.9 (L) 10/20/2018   HCT 36.5 10/20/2018   MCV 88.4 10/20/2018   PLT 350 10/20/2018   CMP     Component Value Date/Time   NA 142 10/20/2018 0831   NA 141 08/06/2017 0927   K 4.5 10/20/2018 0831   CL 104 10/20/2018 0831   CO2 29 10/20/2018 0831   GLUCOSE 86 10/20/2018 0831   BUN 11 10/20/2018 0831   BUN 9 08/06/2017 0927   CREATININE 0.80 10/20/2018 0831   CREATININE 0.74 07/30/2016 1049   CALCIUM 9.9 10/20/2018 0831   PROT 7.7 10/20/2018 0831   PROT 7.1 08/06/2017 0927   ALBUMIN 4.4 10/20/2018 0831   ALBUMIN 4.3 08/06/2017 0927   AST 34 10/20/2018 0831   ALT 60 (H) 10/20/2018 0831    ALKPHOS 62 10/20/2018 0831   BILITOT <0.2 (L) 10/20/2018 0831   GFRNONAA >60 10/20/2018 0831   GFRAA >60 10/20/2018 0831         RADIOGRAPHY: Mm Diag Breast Tomo Bilateral  Result Date: 10/04/2018 CLINICAL DATA:  53 year old patient presents for 1 year follow-up of probably benign right breast calcifications and annual examination of both breasts. She has had 1 month duration of diffuse lateral breast pain, most prominent in the 3 o'clock region. She has been wearing a new underwire bra and is unsure if this may be a cause of the pain. She has a family history of breast cancer in her maternal grandmother, her mother, her paternal grandmother, and 2 paternal aunts. EXAM: DIGITAL DIAGNOSTIC BILATERAL MAMMOGRAM WITH CAD AND TOMO COMPARISON:  Previous exam(s). ACR Breast Density Category b: There are scattered areas of fibroglandular density. FINDINGS: There are new calcifications in the upper outer and retroareolar left breast which are evaluated with magnification views. Magnification views confirm pleomorphic calcifications spanning approximately 10 cm anterior to posterior diameter, by 8 cm craniocaudal span by approximately 7 cm transverse diameter. While the majority of  the calcifications are in the upper-outer quadrant, there are also suspicious calcifications in the central and medial retroareolar left breast, upper inner quadrant. No suspicious mass or distortion is identified in the left breast. Magnification views of the right breast show mammographically stable predominantly punctate calcifications in the upper inner quadrant, a few of which demonstrate layering. There are additional scattered calcifications in the upper inner right breast. No suspicious mass or architectural distortion on the right. Mammographic images were processed with CAD. IMPRESSION: New malignant type calcifications involving large portion of the superior left breast as described above. Stable punctate calcifications in the  upper inner right breast. Strong family history of breast cancer. RECOMMENDATION: Two stereotactic biopsies are recommended of calcifications in the left breast, including the most dominant cluster of pleomorphic calcifications in the slightly outer and upper left breast, posterior third, and of calcifications in the retroareolar left breast. These biopsies have been scheduled on Oct 08, 2018. I discussed with the patient the option of a stereotactic biopsy of the stable right breast calcifications given the suspicious findings on the left. She is scheduled for a right breast stereotactic biopsy on Oct 22, 2018. I have discussed the findings and recommendations with the patient. Results were also provided in writing at the conclusion of the visit. If applicable, a reminder letter will be sent to the patient regarding the next appointment. BI-RADS CATEGORY  5: Highly suggestive of malignancy. Electronically Signed   By: Curlene Dolphin M.D.   On: 10/04/2018 16:54   [place] Axilla Left  Result Date: 10/14/2018 CLINICAL DATA:  53 year old female for evaluation of bilateral axillary lymph nodes. Recent diagnosis of LEFT breast invasive cancer and pending biopsy of RIGHT breast calcifications. EXAM: ULTRASOUND OF THE BILATERAL AXILLA COMPARISON:  None FINDINGS: Ultrasound is performed, showing no abnormalities within either axilla. No abnormal lymph nodes are identified. IMPRESSION: No abnormal appearing lymph nodes within either axillary regions. RECOMMENDATION: Treatment plan for LEFT breast cancer. I have discussed the findings and recommendations with the patient. Results were also provided in writing at the conclusion of the visit. If applicable, a reminder letter will be sent to the patient regarding the next appointment. BI-RADS CATEGORY  1: Negative. Electronically Signed   By: Margarette Canada M.D.   On: 10/14/2018 11:51   [place] Axilla Right  Result Date: 10/14/2018 CLINICAL DATA:  53 year old female for evaluation of  bilateral axillary lymph nodes. Recent diagnosis of LEFT breast invasive cancer and pending biopsy of RIGHT breast calcifications. EXAM: ULTRASOUND OF THE BILATERAL AXILLA COMPARISON:  None FINDINGS: Ultrasound is performed, showing no abnormalities within either axilla. No abnormal lymph nodes are identified. IMPRESSION: No abnormal appearing lymph nodes within either axillary regions. RECOMMENDATION: Treatment plan for LEFT breast cancer. I have discussed the findings and recommendations with the patient. Results were also provided in writing at the conclusion of the visit. If applicable, a reminder letter will be sent to the patient regarding the next appointment. BI-RADS CATEGORY  1: Negative. Electronically Signed   By: Margarette Canada M.D.   On: 10/14/2018 11:51   Mm Clip Placement Left  Result Date: 10/08/2018 CLINICAL DATA:  Evaluate placement of biopsy clips following 2 separate stereotactic guided LEFT breast biopsies. EXAM: DIAGNOSTIC LEFT MAMMOGRAM POST STEREOTACTIC BIOPSY COMPARISON:  Previous exam(s). FINDINGS: Mammographic images were obtained following stereotactic guided biopsy of calcifications within the posterior UPPER OUTER LEFT breast and calcifications within the RETROAREOLAR UPPER-OUTER LEFT breast. The COIL clip is in satisfactory position corresponding to the calcifications biopsied  in the posterior UPPER-OUTER LEFT breast. The X clip is in satisfactory position corresponding to the calcifications biopsied in the RETROAREOLAR UPPER-OUTER LEFT breast. The 2 clips are separated by a distance of 7.2 cm on the LATERAL view. IMPRESSION: Satisfactory position of COIL and X biopsy clips following stereotactic guided LEFT breast biopsies. The 2 clips are separated by a distance of 7.2 cm on the LATERAL view. Final Assessment: Post Procedure Mammograms for Marker Placement Electronically Signed   By: Margarette Canada M.D.   On: 10/08/2018 10:34   Mm Clip Placement Right  Result Date:  10/14/2018 CLINICAL DATA:  Evaluate clip placement following stereotactic guided RIGHT breast biopsy. EXAM: DIAGNOSTIC RIGHT MAMMOGRAM POST STEREOTACTIC BIOPSY COMPARISON:  Previous exam(s). FINDINGS: Mammographic images were obtained following stereotactic guided biopsy of calcifications within the UPPER INNER RIGHT breast. The COIL clip is in satisfactory position. IMPRESSION: Satisfactory COIL clip position following stereotactic guided RIGHT breast biopsy. Final Assessment: Post Procedure Mammograms for Marker Placement Electronically Signed   By: Margarette Canada M.D.   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 INTERMEDIATE GRADE DUCTAL CARCINOMA IN SITU WITH CALCIFICATIONS of the LEFT breast, retroareolar upper outer. This was found to be concordant by Dr. Hassan Rowan. Pathology results were discussed with the patient by telephone. The patient reported doing well after the biopsies with tenderness and swelling at the sites. Post biopsy instructions and care were reviewed and questions were answered. The patient was encouraged to call [facility] for any additional concerns. The patient was referred to [facility] on October 20, 2018. The patient is scheduled for a LEFT axillary ultrasound and RIGHT breast stereotatic biopsy at [facility] on October 14, 2018. Pathology results reported by Terie Purser, RN on 10/11/2018. Electronically Signed   By: Margarette Canada M.D.   On: 10/11/2018 12:05   Result Date: 10/12/2018 CLINICAL DATA:  53 year old female for tissue sampling of suspicious calcifications in the posterior UPPER-OUTER LEFT breast and of suspicious  calcifications in the RETROAREOLAR UPPER-OUTER LEFT breast. EXAM: LEFT BREAST STEREOTACTIC CORE NEEDLE BIOPSY X 2 COMPARISON:  Previous exams. FINDINGS: The patient and I discussed the procedure of stereotactic-guided biopsies including benefits and alternatives. We discussed the high likelihood of successful procedures. We discussed the risks of the procedures including infection, bleeding, tissue injury, clip migration, and inadequate sampling. Informed written consent was given. The usual time out protocol was performed immediately prior to the procedures. STEREOTACTIC BIOPSY OF POSTERIOR UPPER-OUTER LEFT BREAST CALCIFICATIONS (COIL clip): Using sterile technique and 1% Lidocaine as local anesthetic, under stereotactic guidance, a 9 gauge vacuum assisted device was used to perform core needle biopsy of calcifications in the posterior UPPER-OUTER LEFT breast using a SUPERIOR approach. Specimen radiograph was performed showing calcifications. Specimens with calcifications are identified for pathology. Lesion quadrant: UPPER-OUTER LEFT breast At the conclusion of the procedure, a COIL tissue marker clip was deployed into the biopsy cavity. Follow-up 2-view mammogram was performed and dictated separately. STEREOTACTIC BIOPSY OF RETROAREOLAR UPPER-OUTER LEFT BREAST CALCIFICATIONS (X clip): Using sterile technique and 1% Lidocaine as local anesthetic, under stereotactic guidance, a 9 gauge vacuum assisted device was used to perform core needle biopsy of  calcifications in the RETROAREOLAR UPPER-OUTER LEFT breast using a SUPERIOR approach. Specimen radiograph was performed showing calcification. Specimens with calcifications are identified for pathology. Lesion quadrant: RETROAREOLAR UPPER-OUTER LEFT breast At the conclusion of the procedure, a X tissue marker clip was deployed into the biopsy cavity. Follow-up 2-view mammogram was performed and dictated separately. IMPRESSION: Stereotactic-guided biopsy of 2 separate  areas of calcifications within the UPPER-OUTER LEFT breast. No apparent complications. Electronically Signed: By: Margarette Canada M.D. On: 10/08/2018 10:35   Mm Lt Breast Bx W Loc Dev Ea Ad Lesion Img Bx Spec Stereo Guide  Addendum Date: 10/12/2018   ADDENDUM REPORT: 10/11/2018 12:05 ADDENDUM: Pathology revealed GRADE I INVASIVE DUCTAL CARCINOMA, DUCTAL CARCINOMA IN SITU WITH CALCIFICATIONS of the LEFT breast, posterior upper outer. INTERMEDIATE GRADE DUCTAL CARCINOMA IN SITU WITH CALCIFICATIONS of the LEFT breast, retroareolar upper outer. This was found to be concordant by Dr. Hassan Rowan. Pathology results were discussed with the patient by telephone. The patient reported doing well after the biopsies with tenderness and swelling at the sites. Post biopsy instructions and care were reviewed and questions were answered. The patient was encouraged to call [facility] for any additional concerns. The patient was referred to [facility] on October 20, 2018. The patient is scheduled for a LEFT axillary ultrasound and RIGHT breast stereotatic biopsy at [facility] on October 14, 2018. Pathology results reported by Terie Purser, RN on 10/11/2018. Electronically Signed   By: Margarette Canada M.D.   On: 10/11/2018 12:05   Result Date: 10/12/2018 CLINICAL DATA:  53 year old female for tissue sampling of suspicious calcifications in the posterior UPPER-OUTER LEFT breast and of suspicious calcifications in the RETROAREOLAR UPPER-OUTER LEFT breast. EXAM: LEFT BREAST STEREOTACTIC CORE NEEDLE BIOPSY X 2 COMPARISON:  Previous exams. FINDINGS: The patient and I discussed the procedure of stereotactic-guided biopsies including benefits and alternatives. We discussed the high likelihood of successful procedures. We discussed the risks of the procedures including infection, bleeding, tissue injury, clip migration, and inadequate  sampling. Informed written consent was given. The usual time out protocol was performed immediately prior to the procedures. STEREOTACTIC BIOPSY OF POSTERIOR UPPER-OUTER LEFT BREAST CALCIFICATIONS (COIL clip): Using sterile technique and 1% Lidocaine as local anesthetic, under stereotactic guidance, a 9 gauge vacuum assisted device was used to perform core needle biopsy of calcifications in the posterior UPPER-OUTER LEFT breast using a SUPERIOR approach. Specimen radiograph was performed showing calcifications. Specimens with calcifications are identified for pathology. Lesion quadrant: UPPER-OUTER LEFT breast At the conclusion of the procedure, a COIL tissue marker clip was deployed into the biopsy cavity. Follow-up 2-view mammogram was performed and dictated separately. STEREOTACTIC BIOPSY OF RETROAREOLAR UPPER-OUTER LEFT BREAST CALCIFICATIONS (X clip): Using sterile technique and 1% Lidocaine as local anesthetic, under stereotactic guidance, a 9 gauge vacuum assisted device was used to perform core needle biopsy of calcifications in the RETROAREOLAR UPPER-OUTER LEFT breast using a SUPERIOR approach. Specimen radiograph was performed showing calcification. Specimens with calcifications are identified for pathology. Lesion quadrant: RETROAREOLAR UPPER-OUTER LEFT breast At the conclusion of the procedure, a X tissue marker clip was deployed into the biopsy cavity. Follow-up 2-view mammogram was performed and dictated separately. IMPRESSION: Stereotactic-guided biopsy of 2 separate areas of calcifications within the UPPER-OUTER LEFT breast. No apparent complications. Electronically Signed: By: Margarette Canada M.D. 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 This was found to be concordant by Dr. Hassan Rowan, with excision recommended. Pathology results were discussed with the patient by telephone. The patient reported doing well after the biopsy with tenderness at the site. Post biopsy instructions and care were reviewed and questions were answered. The patient was encouraged to call [facility] for any additional concerns. The patient has a recent diagnosis of LEFT breast cancer and should follow her outlined treatment plan. The patient was referred to [facility] on October 20, 2018. Pathology results reported by Terie Purser, RN on 10/15/2018. Electronically Signed   By: Margarette Canada M.D.   On: 10/15/2018 14:39   Result Date: 10/15/2018 CLINICAL DATA:  53 year old female for tissue sampling of UPPER INNER RIGHT breast calcifications. Recent diagnosis of LEFT breast cancer. EXAM: RIGHT BREAST STEREOTACTIC CORE NEEDLE BIOPSY COMPARISON:  Previous exams. FINDINGS: The patient and I discussed the procedure of stereotactic-guided biopsy including benefits and alternatives. We discussed the high likelihood of a successful procedure. We discussed the risks of the procedure including infection, bleeding, tissue injury, clip migration, and inadequate sampling. Informed written consent was given. The usual time out protocol was performed immediately prior to the procedure. Using sterile technique and 1% Lidocaine as local anesthetic, under stereotactic guidance, a 9 gauge vacuum assisted device was used to perform core needle biopsy of calcifications within the UPPER INNER quadrant of the RIGHT breast using a SUPERIOR approach. Specimen radiograph was performed showing calcifications. Specimens with calcifications are identified for pathology. Lesion quadrant: UPPER INNER RIGHT breast At the conclusion of the procedure, a COIL  tissue marker clip was deployed into the biopsy cavity. Follow-up 2-view mammogram was performed and dictated separately. IMPRESSION: Stereotactic-guided biopsy of UPPER INNER RIGHT breast calcifications. No apparent complications. Electronically Signed: By: Margarette Canada M.D. On: 10/14/2018 11:45      IMPRESSION/PLAN: Left breast cancer. She is interested in considering bilateral mastectomies. Surgical options will be discussed with Dr. Donne Hazel.    It was a pleasure meeting the patient today. We discussed the risks, benefits, and side effects of radiotherapy.  Not yet clear if she will need radiotherapy.  She understands that particularly in the setting of mastectomy it is less likely that she would need live radiotherapy.  If she ultimately undergoes breast conservation therapy on the left side radiotherapy would be indicated.  Since she is heavily leaning towards bilateral mastectomies, she understands certain risk factors sometimes warrant adjuvant radiotherapy but we will need to wait on her final pathology. If RT is needed she would rather drive here than go to [facility]. I will see her PRN, as warranted.  __________________________________________   Eppie Gibson, MD

## 2018-10-20 NOTE — Therapy (Signed)
[place], [place], [address] Phone: [phone]   Fax:  [phone]  Physical Therapy Evaluation  Patient Details  Name: Jennifer Harrison MRN: [id number] Date of Birth: 05-29-1966 Referring Provider (PT): Dr. Rolm Bookbinder   Encounter Date: 10/20/2018  PT End of Session - 10/20/18 1304    Visit Number  1    Number of Visits  2    Date for PT Re-Evaluation  12/15/18    PT Start Time  6962    PT Stop Time  0955   Also saw pt from 1143-1153 for a total of 23 minutes   PT Time Calculation (min)  13 min    Activity Tolerance  Patient tolerated treatment well    Behavior During Therapy  [facility] for tasks assessed/performed       Past Medical History:  Diagnosis Date  . Family history of breast cancer     Past Surgical History:  Procedure Laterality Date  . Broken Wrist    . CESAREAN SECTION    . endometrial biopsy  10/15    There were no vitals filed for this visit.   Subjective Assessment - 10/20/18 1243    Subjective  Patient reports she is here today to be seen by her medical team for her newly diagnosed left breast cancer.    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 There is a 10 cm area of calcifications in the upper outer quadrant which is ER/PR positive and HER2 negative with a Ki67 of < 1%. There is also an area in the upper inner quadrant of her right breast that is ADH and will benefit from excision. She has a hx of a right wrist fracture in 1999.    Patient Stated Goals  Reduce lymphedema risk and learn post op shoulder ROM HEP    Currently in Pain?  No/denies         [facility] PT Assessment - 10/20/18 0001      Assessment   Medical Diagnosis  Left breast cancer    Referring Provider (PT)  Dr. Rolm Bookbinder    Onset Date/Surgical Date  10/04/18    Hand Dominance  Right    Prior Therapy  none      Precautions   Precautions  Other (comment)    Precaution Comments  active cancer      Restrictions   Weight Bearing Restrictions  No      Balance Screen   Has the patient fallen in the past 6 months  No    Has the patient had a decrease in activity level because of a fear of falling?   No    Is the patient reluctant to leave their home because of a fear of falling?   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    Leisure  She walks 5x/week for 30-45 min      Cognition   Overall Cognitive Status  Within Functional Limits for tasks assessed  Posture/Postural Control   Posture/Postural Control  Postural limitations    Postural Limitations  Rounded Shoulders;Forward head      ROM / Strength   AROM / PROM / Strength  AROM;Strength      AROM   AROM Assessment Site  Shoulder;Cervical    Right/Left Shoulder  Right;Left    Right Shoulder Extension  47 Degrees    Right Shoulder Flexion  157 Degrees    Right Shoulder ABduction  166 Degrees    Right Shoulder Internal Rotation  64 Degrees    Right Shoulder External Rotation  83 Degrees    Left Shoulder Extension  31 Degrees    Left Shoulder Flexion  154 Degrees    Left Shoulder ABduction  160 Degrees    Left Shoulder Internal Rotation  68 Degrees    Left Shoulder External Rotation  77 Degrees    Cervical Flexion  WNL    Cervical Extension  WNL    Cervical - Right Side Bend  WNL    Cervical - Left Side Bend  WNL    Cervical - Right Rotation  WNL    Cervical - Left Rotation  WNL      Strength   Overall Strength  Within functional limits for tasks performed        LYMPHEDEMA/ONCOLOGY QUESTIONNAIRE - 10/20/18 1302       Type   Cancer Type  Left breast cancer      Lymphedema Assessments   Lymphedema Assessments  Upper extremities      Right Upper Extremity Lymphedema   10 cm Proximal to Olecranon Process  37 cm    Olecranon Process  28.8 cm    10 cm Proximal to Ulnar Styloid Process  25.9 cm    Just Proximal to Ulnar Styloid Process  18.7 cm    Across Hand at [employer]  19.8 cm    At [place] of 2nd Digit  7.1 cm      Left Upper Extremity Lymphedema   10 cm Proximal to Olecranon Process  35.9 cm    Olecranon Process  29.3 cm    10 cm Proximal to Ulnar Styloid Process  25.7 cm    Just Proximal to Ulnar Styloid Process  17.8 cm    Across Hand at [employer]  20 cm    At [place] of 2nd Digit  6.9 cm          Quick Dash - 10/20/18 0001    Open a tight or new jar  Moderate difficulty    Do heavy household chores (wash walls, wash floors)  Mild difficulty    Carry a shopping bag or briefcase  Mild difficulty    Wash your back  No difficulty    Use a knife to cut food  No difficulty    Recreational activities in which you take some force or impact through your arm, shoulder, or hand (golf, hammering, tennis)  No difficulty    During the past week, to what extent has your arm, shoulder or hand problem interfered with your normal social activities with family, friends, neighbors, or groups?  Not at all    During the past week, to what extent has your arm, shoulder or hand problem limited your work or other regular daily activities  Not at all    Arm, shoulder, or hand pain.  None    Tingling (pins and needles) in your arm, shoulder, or hand  Mild    Difficulty  Sleeping  No difficulty    DASH Score  11.36 %        Objective measurements completed on examination: See above findings.     Patient was instructed today in a home exercise program today for post op shoulder range of motion. These included active assist shoulder flexion in sitting, scapular retraction, wall walking with shoulder  abduction, and hands behind head external rotation.  She was encouraged to do these twice a day, holding 3 seconds and repeating 5 times when permitted by her physician.       PT Education - 10/20/18 1304    Education Details  Lymphedema risk reduction and post op shoulder ROM HEP    Person(s) Educated  Patient    Methods  Explanation;Demonstration;Handout    Comprehension  Verbalized understanding;Returned demonstration          PT Long Term Goals - 10/20/18 1330      PT LONG TERM GOAL #1   Title  Patient will demonstrate she has regained shoulder ROM and function post operatively compared to her baseline assessment.    Time  8    Period  Weeks    Status  New      Breast Clinic Goals - 10/20/18 1329      Patient will be able to verbalize understanding of pertinent lymphedema risk reduction practices relevant to her diagnosis specifically related to skin care.   Time  1    Period  Days    Status  Achieved      Patient will be able to return demonstrate and/or verbalize understanding of the post-op home exercise program related to regaining shoulder range of motion.   Time  1    Period  Days    Status  Achieved      Patient will be able to verbalize understanding of the importance of attending the postoperative After Breast Cancer Class for further lymphedema risk reduction education and therapeutic exercise.   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 There is a 10 cm area of calcifications in the upper outer quadrant which is ER/PR positive and HER2 negative with a Ki67 of < 1%. There is also an area in the upper inner quadrant of her right breast that is ADH and will benefit from excision. She has a hx of a right wrist fracture in 1999. Her multidisciplinary medical team met prior to her  assessments to determine a recommended treatment plan. She is planning to have a left mastectomy and sentinel node biopsy and possibly a bilateral mastectomy followed by anti-estrogen therapy. She will benefit from post op PT to regain shoulder ROM and function.    History and Personal Factors relevant to plan of care:  Lives alone part of the time    Clinical Presentation  Stable    Clinical Presentation due to:  Stable condition    Clinical Decision Making  Low    Rehab Potential  Excellent    Clinical Impairments Affecting Rehab Potential  None    PT Frequency  --   Eval and 1 f/u visit   PT Treatment/Interventions  ADLs/Self Care Home Management;Patient/family education;Therapeutic exercise    PT Next Visit Plan  Will reassess 3-4 weeks post op  to reassess and determine plan.    PT Home Exercise Plan  Post op shoulder ROM HEP    Consulted and Agree with Plan of Care  Patient;Family member/caregiver    Family Member Consulted  Boyfriend       Patient will benefit from skilled therapeutic intervention in order to improve the following deficits and impairments:  Decreased range of motion, Impaired UE functional use, Pain, Decreased knowledge of precautions, Postural dysfunction  Visit Diagnosis: Malignant neoplasm of upper-outer quadrant of left breast in female, estrogen receptor positive ([place]) - Plan: PT plan of care cert/re-cert  Abnormal posture - Plan: PT plan of care cert/re-cert   Patient will follow up at outpatient cancer rehab 3-4 weeks following surgery.  If the patient requires physical therapy at that time, a specific plan will be dictated and sent to the referring physician for approval. The patient was educated today on appropriate basic range of motion exercises to begin post operatively and the importance of attending the After Breast Cancer class following surgery.  Patient was educated today on lymphedema risk reduction practices as it pertains to recommendations that  will benefit the patient immediately following surgery.  She verbalized good understanding.      Problem List Patient Active Problem List   Diagnosis Date Noted  . Malignant neoplasm of upper-outer quadrant of left breast in female, estrogen receptor positive ([place]) 10/14/2018  . Genetic testing 12/15/2017  . Family history of breast cancer   . Family history of diabetes mellitus 07/29/2013   Annia Friendly, PT 10/20/18 1:35 PM  [place], [place], [address] Phone: [phone]   Fax:  [phone]  Name: Jennifer Harrison MRN: [id number] Date of Birth: 01/21/1966

## 2018-10-21 ENCOUNTER — Telehealth: Payer: Self-pay | Admitting: Oncology

## 2018-10-21 NOTE — Telephone Encounter (Signed)
Called regarding 3/19

## 2018-10-26 DIAGNOSIS — Z6841 Body Mass Index (BMI) 40.0 and over, adult: Secondary | ICD-10-CM | POA: Insufficient documentation

## 2018-10-28 ENCOUNTER — Other Ambulatory Visit: Payer: Self-pay | Admitting: General Surgery

## 2018-10-28 ENCOUNTER — Telehealth: Payer: Self-pay | Admitting: *Deleted

## 2018-10-28 DIAGNOSIS — C50912 Malignant neoplasm of unspecified site of left female breast: Secondary | ICD-10-CM

## 2018-10-28 DIAGNOSIS — Z170 Estrogen receptor positive status [ER+]: Principal | ICD-10-CM

## 2018-10-28 DIAGNOSIS — N6091 Unspecified benign mammary dysplasia of right breast: Secondary | ICD-10-CM

## 2018-10-28 NOTE — Telephone Encounter (Signed)
  Oncology Nurse Navigator Documentation  Navigator Location: [facility] (10/28/18 1200)   )Navigator Encounter Type: Telephone;MDC Follow-up (10/28/18 1200) Telephone: Outgoing Call;Clinic/MDC Follow-up (10/28/18 1200)           Plastic Surgery Consult Date: 10/26/18 (10/28/18 1200)                                      Time Spent with Patient: 15 (10/28/18 1200)

## 2018-11-05 ENCOUNTER — Other Ambulatory Visit: Payer: Self-pay | Admitting: General Surgery

## 2018-11-05 DIAGNOSIS — N6091 Unspecified benign mammary dysplasia of right breast: Secondary | ICD-10-CM

## 2018-11-11 NOTE — H&P (Signed)
Subjective:     Patient ID: Jennifer Harrison is a 53 y.o. female.  HPI  Here for follow up discussion breast reconstruction prior to planned mastectomy left. Presented following screening MMG Jan 2019 with RIGHT breast calcifications. Following diagnostic MMG/US at that time recommended 6 month follow up, which showed stable calcifications. Jan 2020 MMG demonstrated new pleomorphic calcifications in the LEFT UO and retroareolar spanning 10 x 8 x 7 cm. Calcifications are also noted calcifications in the central and medial retroareolar left breast, UIQ. In the RIGHT breast, stable UIQ calcifications noted.     Biopsy labeled LEFT posterior upper outer showed IDC, ER/PR+, Her2-. Biopsy labeled LEFT retroareolar upper outer showed DCIS, intermediate grade. Biopsy of the RIGHT breast labeled upper inner showed ADH with calcifications, ALH.  [place] bilateral axillae negative.  Left mastectomy, right lumpectomy planned. Tamoxifen started neo adjuvantly. Plan Oncotype on surgical specimen.  Mother passed from metastatic breast ca. Multiple family members with breast ca. 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  Current 40 D. States wt stable over last year but overall feels wt gain over last several and goal would be 150 lb.   Patient works asa Chiropractor. She is divorced.She lives alone. Has SO. One adult sone in [place].  Review of Systems   Objective:   Physical Exam  Cardiovascular: Normal rate, regular rhythm and normal heart sounds.  Pulmonary/Chest: Effort normal and breath sounds normal.  Lymphadenopathy:    She has no axillary adenopathy.   No palpable masses Grade 1 ptosis on right 2 on left SN to nipple R 27 L 29 cm BW R 20 L 24 cm Nipple to IMF R 14 L 13 cm    Assessment:     Left breast cancer UOQ ER+, Right ADH Family history breast cancer    Plan:   Plan left skin reduction pattern mastectomy right lumpectomy with immediate left tissue expander acellular  dermis reconstruction.  Reviewed incisions, drains, OR length, hospital stay and recovery, limitations. Discussed process of expansion and implant based risks including rupture, MRI surveillance for silicone implants, infection requiring surgery or removal, contracture. Reviewed risks mastectomy flap necrosis requiring additional surgery. Given location disease and overall breast size recommend SRM. Reviewed anchor type scars.  I anticipate right breast reduction in future as symmetry procedure and will mark patient for reduction- counseled patient Dr. Donne Hazel may utilize this for lumpectomy.  Discussed use of acellular dermis in reconstruction, cadaveric source, incorporation over several weeks, risk that if has seroma or infection can act as additional nidus for infection if not incorporated.  Discussed prepectoral vs sub pectoral reconstruction. Discussed with patient and benefit of this is no animation deformity, may be less pain. Risk may be more visible rippling over upper poles, greater need of ADM. Reviewed pre pectoral would require larger amount acellular dermis, more drains. Discussed any type reconstruction also risks long term displacement implant and visible rippling. If prepectoral counseled I would recommend she be comfortable with silicone implants as more options that have less rippling. She agrees to prepectoral placement.  Reviewed reconstruction will be asensate and not stimulate. Reviewed additional risks including but not limited to risks mastectomy flap necrosis requiring additional surgery, seroma, hematoma, asymmetry, need to additional procedures, fat necrosis, DVT/PE, damage to adjacent structures, cardiopulmonary complications.  Rx for [employer] given   [name], MD [facility] Plastic & Reconstructive Surgery [phone], pin [phone]

## 2018-11-12 ENCOUNTER — Encounter (HOSPITAL_BASED_OUTPATIENT_CLINIC_OR_DEPARTMENT_OTHER): Payer: Self-pay | Admitting: *Deleted

## 2018-11-12 ENCOUNTER — Other Ambulatory Visit: Payer: Self-pay

## 2018-11-19 ENCOUNTER — Ambulatory Visit
Admission: RE | Admit: 2018-11-19 | Discharge: 2018-11-19 | Disposition: A | Discharge status: Home / Self Care | Payer: No Typology Code available for payment source | Source: Ambulatory Visit | Attending: General Surgery | Admitting: General Surgery

## 2018-11-19 ENCOUNTER — Other Ambulatory Visit: Payer: Self-pay

## 2018-11-19 DIAGNOSIS — N6091 Unspecified benign mammary dysplasia of right breast: Secondary | ICD-10-CM

## 2018-11-22 ENCOUNTER — Other Ambulatory Visit: Payer: Self-pay

## 2018-11-22 ENCOUNTER — Encounter (HOSPITAL_BASED_OUTPATIENT_CLINIC_OR_DEPARTMENT_OTHER)
Admission: RE | Disposition: A | Discharge status: Home / Self Care | Payer: Self-pay | Source: Home / Self Care | Attending: General Surgery

## 2018-11-22 ENCOUNTER — Ambulatory Visit (HOSPITAL_COMMUNITY)
Admission: RE | Admit: 2018-11-22 | Discharge: 2018-11-22 | Disposition: A | Discharge status: Home / Self Care | Payer: No Typology Code available for payment source | Source: Ambulatory Visit | Attending: General Surgery | Admitting: General Surgery

## 2018-11-22 ENCOUNTER — Ambulatory Visit (HOSPITAL_BASED_OUTPATIENT_CLINIC_OR_DEPARTMENT_OTHER): Payer: No Typology Code available for payment source | Admitting: Anesthesiology

## 2018-11-22 ENCOUNTER — Ambulatory Visit (HOSPITAL_BASED_OUTPATIENT_CLINIC_OR_DEPARTMENT_OTHER)
Admission: RE | Admit: 2018-11-22 | Discharge: 2018-11-23 | Disposition: A | Discharge status: Home / Self Care | Payer: No Typology Code available for payment source | Attending: General Surgery | Admitting: General Surgery

## 2018-11-22 ENCOUNTER — Ambulatory Visit
Admission: RE | Admit: 2018-11-22 | Discharge: 2018-11-22 | Disposition: A | Discharge status: Home / Self Care | Payer: No Typology Code available for payment source | Source: Ambulatory Visit | Attending: General Surgery | Admitting: General Surgery

## 2018-11-22 ENCOUNTER — Encounter (HOSPITAL_BASED_OUTPATIENT_CLINIC_OR_DEPARTMENT_OTHER): Payer: Self-pay

## 2018-11-22 DIAGNOSIS — Z170 Estrogen receptor positive status [ER+]: Secondary | ICD-10-CM | POA: Insufficient documentation

## 2018-11-22 DIAGNOSIS — N6091 Unspecified benign mammary dysplasia of right breast: Secondary | ICD-10-CM | POA: Insufficient documentation

## 2018-11-22 DIAGNOSIS — Z803 Family history of malignant neoplasm of breast: Secondary | ICD-10-CM | POA: Diagnosis not present

## 2018-11-22 DIAGNOSIS — Z87891 Personal history of nicotine dependence: Secondary | ICD-10-CM | POA: Diagnosis not present

## 2018-11-22 DIAGNOSIS — C50912 Malignant neoplasm of unspecified site of left female breast: Secondary | ICD-10-CM

## 2018-11-22 DIAGNOSIS — C50412 Malignant neoplasm of upper-outer quadrant of left female breast: Secondary | ICD-10-CM | POA: Insufficient documentation

## 2018-11-22 HISTORY — PX: BREAST BIOPSY: SHX20

## 2018-11-22 HISTORY — PX: RADIOACTIVE SEED GUIDED EXCISIONAL BREAST BIOPSY: SHX6490

## 2018-11-22 HISTORY — PX: BREAST RECONSTRUCTION WITH PLACEMENT OF TISSUE EXPANDER AND ALLODERM: SHX6805

## 2018-11-22 HISTORY — PX: MASTECTOMY: SHX3

## 2018-11-22 HISTORY — PX: MASTECTOMY W/ SENTINEL NODE BIOPSY: SHX2001

## 2018-11-22 SURGERY — MASTECTOMY WITH SENTINEL LYMPH NODE BIOPSY
Anesthesia: Regional | Site: Breast | Laterality: Right

## 2018-11-22 MED ORDER — GABAPENTIN 100 MG PO CAPS
100.0000 mg | ORAL_CAPSULE | ORAL | Status: AC
  Administered 2018-11-22: 100 mg via ORAL

## 2018-11-22 MED ORDER — OXYCODONE HCL 5 MG PO TABS: 5.0000 mg | ORAL_TABLET | Freq: Once | ORAL | Status: DC | PRN

## 2018-11-22 MED ORDER — ONDANSETRON 4 MG PO TBDP: 4.0000 mg | ORAL_TABLET | Freq: Four times a day (QID) | ORAL | Status: DC | PRN

## 2018-11-22 MED ORDER — FENTANYL CITRATE (PF) 100 MCG/2ML IJ SOLN
50.0000 ug | INTRAMUSCULAR | Status: AC | PRN
  Administered 2018-11-22: 50 ug via INTRAVENOUS
  Administered 2018-11-22: 100 ug via INTRAVENOUS
  Administered 2018-11-22 (×2): 50 ug via INTRAVENOUS

## 2018-11-22 MED ORDER — FENTANYL CITRATE (PF) 100 MCG/2ML IJ SOLN
INTRAMUSCULAR | Status: AC
  Filled 2018-11-22: qty 2

## 2018-11-22 MED ORDER — CHLORHEXIDINE GLUCONATE CLOTH 2 % EX PADS: 6.0000 | MEDICATED_PAD | Freq: Once | CUTANEOUS | Status: DC

## 2018-11-22 MED ORDER — KETOROLAC TROMETHAMINE 30 MG/ML IJ SOLN: 30.0000 mg | Freq: Once | INTRAMUSCULAR | Status: AC | PRN

## 2018-11-22 MED ORDER — SODIUM CHLORIDE 0.9 % IV SOLN
INTRAVENOUS | Status: DC
  Administered 2018-11-22: 12:00:00 via INTRAVENOUS

## 2018-11-22 MED ORDER — MORPHINE SULFATE (PF) 4 MG/ML IV SOLN: 2.0000 mg | INTRAVENOUS | Status: DC | PRN

## 2018-11-22 MED ORDER — TECHNETIUM TC 99M SULFUR COLLOID FILTERED
1.0000 | Freq: Once | INTRAVENOUS | Status: AC | PRN
  Administered 2018-11-22: 1 via INTRADERMAL

## 2018-11-22 MED ORDER — PHENYLEPHRINE HCL 10 MG/ML IJ SOLN
INTRAMUSCULAR | Status: DC | PRN
  Administered 2018-11-22 (×8): 100 ug via INTRAVENOUS

## 2018-11-22 MED ORDER — LIDOCAINE 2% (20 MG/ML) 5 ML SYRINGE
INTRAMUSCULAR | Status: AC
  Filled 2018-11-22: qty 5

## 2018-11-22 MED ORDER — CIPROFLOXACIN IN D5W 400 MG/200ML IV SOLN
400.0000 mg | INTRAVENOUS | Status: AC
  Administered 2018-11-22: 400 mg via INTRAVENOUS

## 2018-11-22 MED ORDER — PROPOFOL 10 MG/ML IV BOLUS
INTRAVENOUS | Status: AC
  Filled 2018-11-22: qty 20

## 2018-11-22 MED ORDER — PHENYLEPHRINE 40 MCG/ML (10ML) SYRINGE FOR IV PUSH (FOR BLOOD PRESSURE SUPPORT)
PREFILLED_SYRINGE | INTRAVENOUS | Status: AC
  Filled 2018-11-22: qty 20

## 2018-11-22 MED ORDER — PROPOFOL 10 MG/ML IV BOLUS
INTRAVENOUS | Status: DC | PRN
  Administered 2018-11-22 (×2): 50 mg via INTRAVENOUS
  Administered 2018-11-22: 100 mg via INTRAVENOUS

## 2018-11-22 MED ORDER — ONDANSETRON HCL 4 MG/2ML IJ SOLN
INTRAMUSCULAR | Status: AC
  Filled 2018-11-22: qty 2

## 2018-11-22 MED ORDER — KETOROLAC TROMETHAMINE 15 MG/ML IJ SOLN
INTRAMUSCULAR | Status: AC
  Filled 2018-11-22: qty 1

## 2018-11-22 MED ORDER — EPHEDRINE SULFATE 50 MG/ML IJ SOLN
INTRAMUSCULAR | Status: DC | PRN
  Administered 2018-11-22 (×3): 10 mg via INTRAVENOUS

## 2018-11-22 MED ORDER — ACETAMINOPHEN 500 MG PO TABS
ORAL_TABLET | ORAL | Status: AC
  Filled 2018-11-22: qty 2

## 2018-11-22 MED ORDER — KETOROLAC TROMETHAMINE 15 MG/ML IJ SOLN
15.0000 mg | Freq: Four times a day (QID) | INTRAMUSCULAR | Status: DC | PRN
  Administered 2018-11-22 – 2018-11-23 (×2): 15 mg via INTRAVENOUS
  Filled 2018-11-22 (×2): qty 1

## 2018-11-22 MED ORDER — KETOROLAC TROMETHAMINE 15 MG/ML IJ SOLN
15.0000 mg | INTRAMUSCULAR | Status: AC
  Administered 2018-11-22: 15 mg via INTRAVENOUS

## 2018-11-22 MED ORDER — ACETAMINOPHEN 500 MG PO TABS
1000.0000 mg | ORAL_TABLET | Freq: Four times a day (QID) | ORAL | Status: DC
  Administered 2018-11-22 – 2018-11-23 (×3): 1000 mg via ORAL
  Filled 2018-11-22 (×3): qty 2

## 2018-11-22 MED ORDER — FENTANYL CITRATE (PF) 100 MCG/2ML IJ SOLN
INTRAMUSCULAR | Status: AC
  Filled 2018-11-22: qty 4

## 2018-11-22 MED ORDER — METHOCARBAMOL 500 MG PO TABS
500.0000 mg | ORAL_TABLET | Freq: Three times a day (TID) | ORAL | Status: DC
  Administered 2018-11-22 (×2): 500 mg via ORAL
  Filled 2018-11-22 (×2): qty 1

## 2018-11-22 MED ORDER — CIPROFLOXACIN IN D5W 400 MG/200ML IV SOLN
INTRAVENOUS | Status: AC
  Filled 2018-11-22: qty 200

## 2018-11-22 MED ORDER — LACTATED RINGERS IV SOLN
INTRAVENOUS | Status: DC
  Administered 2018-11-22 (×2): via INTRAVENOUS

## 2018-11-22 MED ORDER — OXYCODONE HCL 5 MG PO TABS
5.0000 mg | ORAL_TABLET | ORAL | Status: DC | PRN
  Administered 2018-11-22 (×2): 10 mg via ORAL
  Filled 2018-11-22 (×2): qty 2

## 2018-11-22 MED ORDER — SIMETHICONE 80 MG PO CHEW: 40.0000 mg | CHEWABLE_TABLET | Freq: Four times a day (QID) | ORAL | Status: DC | PRN

## 2018-11-22 MED ORDER — BUPIVACAINE HCL (PF) 0.25 % IJ SOLN
INTRAMUSCULAR | Status: DC | PRN
  Administered 2018-11-22: 10 mL

## 2018-11-22 MED ORDER — DEXAMETHASONE SODIUM PHOSPHATE 10 MG/ML IJ SOLN
INTRAMUSCULAR | Status: AC
  Filled 2018-11-22: qty 1

## 2018-11-22 MED ORDER — MIDAZOLAM HCL 2 MG/2ML IJ SOLN
INTRAMUSCULAR | Status: AC
  Filled 2018-11-22: qty 2

## 2018-11-22 MED ORDER — PROMETHAZINE HCL 25 MG/ML IJ SOLN: 6.2500 mg | INTRAMUSCULAR | Status: DC | PRN

## 2018-11-22 MED ORDER — ONDANSETRON HCL 4 MG/2ML IJ SOLN: 4.0000 mg | Freq: Four times a day (QID) | INTRAMUSCULAR | Status: DC | PRN

## 2018-11-22 MED ORDER — FENTANYL CITRATE (PF) 100 MCG/2ML IJ SOLN
25.0000 ug | INTRAMUSCULAR | Status: DC | PRN
  Administered 2018-11-22 (×2): 50 ug via INTRAVENOUS

## 2018-11-22 MED ORDER — ACETAMINOPHEN 500 MG PO TABS
1000.0000 mg | ORAL_TABLET | ORAL | Status: AC
  Administered 2018-11-22: 1000 mg via ORAL

## 2018-11-22 MED ORDER — LIDOCAINE HCL (CARDIAC) PF 100 MG/5ML IV SOSY
PREFILLED_SYRINGE | INTRAVENOUS | Status: DC | PRN
  Administered 2018-11-22: 50 mg via INTRAVENOUS

## 2018-11-22 MED ORDER — OXYCODONE HCL 5 MG PO TABS: 5.0000 mg | ORAL_TABLET | ORAL | 0 refills | Status: DC | PRN

## 2018-11-22 MED ORDER — ENSURE PRE-SURGERY PO LIQD: 296.0000 mL | Freq: Once | ORAL | Status: DC

## 2018-11-22 MED ORDER — ROCURONIUM BROMIDE 50 MG/5ML IV SOSY
PREFILLED_SYRINGE | INTRAVENOUS | Status: AC
  Filled 2018-11-22: qty 5

## 2018-11-22 MED ORDER — SCOPOLAMINE 1 MG/3DAYS TD PT72: 1.0000 | MEDICATED_PATCH | Freq: Once | TRANSDERMAL | Status: DC | PRN

## 2018-11-22 MED ORDER — MIDAZOLAM HCL 2 MG/2ML IJ SOLN
1.0000 mg | INTRAMUSCULAR | Status: DC | PRN
  Administered 2018-11-22 (×2): 2 mg via INTRAVENOUS

## 2018-11-22 MED ORDER — ONDANSETRON HCL 4 MG/2ML IJ SOLN
INTRAMUSCULAR | Status: DC | PRN
  Administered 2018-11-22: 4 mg via INTRAVENOUS

## 2018-11-22 MED ORDER — BUPIVACAINE HCL (PF) 0.25 % IJ SOLN
INTRAMUSCULAR | Status: AC
  Filled 2018-11-22: qty 30

## 2018-11-22 MED ORDER — CIPROFLOXACIN IN D5W 400 MG/200ML IV SOLN
400.0000 mg | Freq: Two times a day (BID) | INTRAVENOUS | Status: AC
  Administered 2018-11-22: 400 mg via INTRAVENOUS

## 2018-11-22 MED ORDER — OXYCODONE HCL 5 MG/5ML PO SOLN: 5.0000 mg | Freq: Once | ORAL | Status: DC | PRN

## 2018-11-22 MED ORDER — POVIDONE-IODINE 10 % EX SOLN
CUTANEOUS | Status: DC | PRN
  Administered 2018-11-22: 1 via TOPICAL

## 2018-11-22 MED ORDER — ROCURONIUM BROMIDE 100 MG/10ML IV SOLN
INTRAVENOUS | Status: DC | PRN
  Administered 2018-11-22: 50 mg via INTRAVENOUS

## 2018-11-22 MED ORDER — ROPIVACAINE HCL 5 MG/ML IJ SOLN
INTRAMUSCULAR | Status: DC | PRN
  Administered 2018-11-22: 30 mL via PERINEURAL

## 2018-11-22 MED ORDER — METHOCARBAMOL 500 MG PO TABS: 500.0000 mg | ORAL_TABLET | Freq: Three times a day (TID) | ORAL | 0 refills | Status: DC | PRN

## 2018-11-22 MED ORDER — SUGAMMADEX SODIUM 200 MG/2ML IV SOLN
INTRAVENOUS | Status: DC | PRN
  Administered 2018-11-22: 200 mg via INTRAVENOUS

## 2018-11-22 MED ORDER — DOXYCYCLINE HYCLATE 50 MG PO CAPS: 50.0000 mg | ORAL_CAPSULE | Freq: Two times a day (BID) | ORAL | 0 refills | Status: DC

## 2018-11-22 MED ORDER — SUGAMMADEX SODIUM 200 MG/2ML IV SOLN
INTRAVENOUS | Status: AC
  Filled 2018-11-22: qty 2

## 2018-11-22 MED ORDER — DEXAMETHASONE SODIUM PHOSPHATE 10 MG/ML IJ SOLN
INTRAMUSCULAR | Status: DC | PRN
  Administered 2018-11-22: 4 mg via INTRAVENOUS

## 2018-11-22 MED ORDER — GABAPENTIN 100 MG PO CAPS
ORAL_CAPSULE | ORAL | Status: AC
  Filled 2018-11-22: qty 1

## 2018-11-22 MED ORDER — SODIUM CHLORIDE 0.9 % IV SOLN
INTRAVENOUS | Status: DC | PRN
  Administered 2018-11-22: 500 mL

## 2018-11-22 MED ORDER — EPHEDRINE 5 MG/ML INJ
INTRAVENOUS | Status: AC
  Filled 2018-11-22: qty 10

## 2018-11-22 SURGICAL SUPPLY — 104 items
ALLOGRAFT PERF 16X20 1.6+/-0.4 (Tissue) ×2 IMPLANT
APPLIER CLIP 11 MED OPEN (CLIP)
APPLIER CLIP 9.375 MED OPEN (MISCELLANEOUS)
BAG DECANTER FOR FLEXI CONT (MISCELLANEOUS) ×5 IMPLANT
BENZOIN TINCTURE PRP APPL 2/3 (GAUZE/BANDAGES/DRESSINGS) IMPLANT
BINDER BREAST LRG (GAUZE/BANDAGES/DRESSINGS) IMPLANT
BINDER BREAST MEDIUM (GAUZE/BANDAGES/DRESSINGS) IMPLANT
BINDER BREAST XLRG (GAUZE/BANDAGES/DRESSINGS) IMPLANT
BINDER BREAST XXLRG (GAUZE/BANDAGES/DRESSINGS) ×2 IMPLANT
BIOPATCH RED 1 DISK 7.0 (GAUZE/BANDAGES/DRESSINGS) IMPLANT
BLADE CLIPPER SURG (BLADE) IMPLANT
BLADE HEX COATED 2.75 (ELECTRODE) IMPLANT
BLADE SURG 10 STRL SS (BLADE) ×10 IMPLANT
BLADE SURG 15 STRL LF DISP TIS (BLADE) ×4 IMPLANT
BLADE SURG 15 STRL SS (BLADE) ×1
BNDG GAUZE ELAST 4 BULKY (GAUZE/BANDAGES/DRESSINGS) ×10 IMPLANT
CANISTER SUC SOCK COL 7IN (MISCELLANEOUS) IMPLANT
CANISTER SUCT 1200ML W/VALVE (MISCELLANEOUS) ×5 IMPLANT
CHLORAPREP W/TINT 26 (MISCELLANEOUS) ×10 IMPLANT
CLIP APPLIE 11 MED OPEN (CLIP) IMPLANT
CLIP APPLIE 9.375 MED OPEN (MISCELLANEOUS) IMPLANT
CLIP VESOCCLUDE SM WIDE 6/CT (CLIP) IMPLANT
CONT SPEC 4OZ CLIKSEAL STRL BL (MISCELLANEOUS) ×2 IMPLANT
COUNTER NEEDLE 1200 MAGNETIC (NEEDLE) ×2 IMPLANT
COVER BACK TABLE 60X90IN (DRAPES) ×5 IMPLANT
COVER MAYO STAND STRL (DRAPES) ×10 IMPLANT
COVER PROBE W GEL 5X96 (DRAPES) ×5 IMPLANT
COVER WAND RF STERILE (DRAPES) IMPLANT
DECANTER SPIKE VIAL GLASS SM (MISCELLANEOUS) IMPLANT
DERMABOND ADVANCED (GAUZE/BANDAGES/DRESSINGS) ×2
DERMABOND ADVANCED .7 DNX12 (GAUZE/BANDAGES/DRESSINGS) ×8 IMPLANT
DRAIN CHANNEL 15F RND FF W/TCR (WOUND CARE) ×2 IMPLANT
DRAIN CHANNEL 19F RND (DRAIN) ×5 IMPLANT
DRAPE TOP ARMCOVERS (MISCELLANEOUS) ×5 IMPLANT
DRAPE U-SHAPE 76X120 STRL (DRAPES) ×5 IMPLANT
DRAPE UTILITY XL STRL (DRAPES) ×7 IMPLANT
DRSG PAD ABDOMINAL 8X10 ST (GAUZE/BANDAGES/DRESSINGS) ×10 IMPLANT
DRSG TEGADERM 4X10 (GAUZE/BANDAGES/DRESSINGS) ×10 IMPLANT
DRSG TEGADERM 4X4.75 (GAUZE/BANDAGES/DRESSINGS) IMPLANT
ELECT BLADE 4.0 EZ CLEAN MEGAD (MISCELLANEOUS) ×5
ELECT COATED BLADE 2.86 ST (ELECTRODE) ×5 IMPLANT
ELECT REM PT RETURN 9FT ADLT (ELECTROSURGICAL) ×5
ELECTRODE BLDE 4.0 EZ CLN MEGD (MISCELLANEOUS) ×4 IMPLANT
ELECTRODE REM PT RTRN 9FT ADLT (ELECTROSURGICAL) ×4 IMPLANT
EVACUATOR SILICONE 100CC (DRAIN) ×7 IMPLANT
EXPANDER TISSUE FV FOURTE 500 (Prosthesis & Implant Plastic) ×1 IMPLANT
GAUZE SPONGE 4X4 12PLY STRL LF (GAUZE/BANDAGES/DRESSINGS) IMPLANT
GLOVE BIO SURGEON STRL SZ 6 (GLOVE) ×10 IMPLANT
GLOVE BIO SURGEON STRL SZ 6.5 (GLOVE) ×4 IMPLANT
GLOVE BIO SURGEON STRL SZ7 (GLOVE) ×14 IMPLANT
GLOVE BIOGEL PI IND STRL 7.5 (GLOVE) ×5 IMPLANT
GLOVE BIOGEL PI INDICATOR 7.5 (GLOVE) ×2
GOWN STRL REUS W/ TWL LRG LVL3 (GOWN DISPOSABLE) ×14 IMPLANT
GOWN STRL REUS W/ TWL XL LVL3 (GOWN DISPOSABLE) ×2 IMPLANT
GOWN STRL REUS W/TWL LRG LVL3 (GOWN DISPOSABLE) ×2
GOWN STRL REUS W/TWL XL LVL3 (GOWN DISPOSABLE) ×2
HEMOSTAT ARISTA ABSORB 3G PWDR (HEMOSTASIS) IMPLANT
ILLUMINATOR WAVEGUIDE N/F (MISCELLANEOUS) IMPLANT
KIT FILL SYSTEM UNIVERSAL (SET/KITS/TRAYS/PACK) IMPLANT
KIT MARKER MARGIN INK (KITS) ×5 IMPLANT
LIGHT WAVEGUIDE WIDE FLAT (MISCELLANEOUS) ×7 IMPLANT
MARKER SKIN DUAL TIP RULER LAB (MISCELLANEOUS) IMPLANT
NDL HYPO 25X1 1.5 SAFETY (NEEDLE) ×3 IMPLANT
NDL SAFETY ECLIPSE 18X1.5 (NEEDLE) IMPLANT
NEEDLE HYPO 18GX1.5 SHARP (NEEDLE)
NEEDLE HYPO 25X1 1.5 SAFETY (NEEDLE) ×5 IMPLANT
NS IRRIG 1000ML POUR BTL (IV SOLUTION) ×5 IMPLANT
PACK BASIN DAY SURGERY FS (CUSTOM PROCEDURE TRAY) ×5 IMPLANT
PENCIL BUTTON HOLSTER BLD 10FT (ELECTRODE) ×5 IMPLANT
PIN SAFETY STERILE (MISCELLANEOUS) ×5 IMPLANT
PUNCH BIOPSY DERMAL 4MM (MISCELLANEOUS) IMPLANT
SHEET MEDIUM DRAPE 40X70 STRL (DRAPES) ×7 IMPLANT
SLEEVE SCD COMPRESS KNEE MED (MISCELLANEOUS) ×5 IMPLANT
SPONGE LAP 18X18 RF (DISPOSABLE) ×14 IMPLANT
SPONGE LAP 4X18 RFD (DISPOSABLE) ×3 IMPLANT
STAPLER VISISTAT 35W (STAPLE) ×5 IMPLANT
STRIP CLOSURE SKIN 1/2X4 (GAUZE/BANDAGES/DRESSINGS) ×3 IMPLANT
SUT CHROMIC 4 0 PS 2 18 (SUTURE) ×6 IMPLANT
SUT ETHILON 2 0 FS 18 (SUTURE) ×5 IMPLANT
SUT MNCRL AB 4-0 PS2 18 (SUTURE) ×9 IMPLANT
SUT MON AB 5-0 PS2 18 (SUTURE) ×2 IMPLANT
SUT SILK 2 0 SH (SUTURE) ×2 IMPLANT
SUT VIC AB 2-0 SH 27 (SUTURE) ×1
SUT VIC AB 2-0 SH 27XBRD (SUTURE) ×4 IMPLANT
SUT VIC AB 3-0 54X BRD REEL (SUTURE) IMPLANT
SUT VIC AB 3-0 BRD 54 (SUTURE)
SUT VIC AB 3-0 SH 27 (SUTURE) ×2
SUT VIC AB 3-0 SH 27X BRD (SUTURE) ×5 IMPLANT
SUT VICRYL 0 CT-2 (SUTURE) ×6 IMPLANT
SUT VICRYL 3-0 CR8 SH (SUTURE) ×5 IMPLANT
SUT VICRYL 4-0 PS2 18IN ABS (SUTURE) ×2 IMPLANT
SUT VLOC 180 0 24IN GS25 (SUTURE) ×2 IMPLANT
SYR 50ML LL SCALE MARK (SYRINGE) ×5 IMPLANT
SYR BULB IRRIGATION 50ML (SYRINGE) ×10 IMPLANT
SYR CONTROL 10ML LL (SYRINGE) ×5 IMPLANT
TAPE MEASURE VINYL STERILE (MISCELLANEOUS) IMPLANT
TISSUE EXPNDR FV FOURTE 500 (Prosthesis & Implant Plastic) ×5 IMPLANT
TOWEL GREEN STERILE FF (TOWEL DISPOSABLE) ×10 IMPLANT
TRAY DSU PREP LF (CUSTOM PROCEDURE TRAY) ×5 IMPLANT
TRAY FAXITRON CT DISP (TRAY / TRAY PROCEDURE) ×5 IMPLANT
TRAY FOLEY W/BAG SLVR 14FR LF (SET/KITS/TRAYS/PACK) IMPLANT
TUBE CONNECTING 20X1/4 (TUBING) ×7 IMPLANT
UNDERPAD 30X30 (UNDERPADS AND DIAPERS) ×10 IMPLANT
YANKAUER SUCT BULB TIP NO VENT (SUCTIONS) ×5 IMPLANT

## 2018-11-22 NOTE — Anesthesia Procedure Notes (Signed)
Procedure Name: Intubation Date/Time: 11/22/2018 7:52 AM Performed by: Jonna Munro, CRNA Pre-anesthesia Checklist: Patient identified, Emergency Drugs available, Suction available, Patient being monitored and Timeout performed Patient Re-evaluated:Patient Re-evaluated prior to induction Oxygen Delivery Method: Circle system utilized Preoxygenation: Pre-oxygenation with 100% oxygen Induction Type: IV induction Ventilation: Oral airway inserted - appropriate to patient size Laryngoscope Size: Glidescope and 3 Grade View: Grade I Tube type: Oral Tube size: 7.0 mm Number of attempts: 2 Airway Equipment and Method: Stylet Placement Confirmation: ETT inserted through vocal cords under direct vision,  positive ETCO2 and breath sounds checked- equal and bilateral Secured at: 22 cm Tube secured with: Tape Comments: DL with MAC 3, no visualization of cords, attempted to place ETT, no BBS heard, ETT removed. Mask ventilated with 8cm airway easily. DL with MAC 3 glidescope cords visualized, ETT passed easily, +BBS, +ETCO2. Atraumatic intubation.

## 2018-11-22 NOTE — Anesthesia Preprocedure Evaluation (Addendum)
Anesthesia Evaluation  Patient identified by MRN, date of birth, ID band Patient awake    Reviewed: Allergy & Precautions, NPO status , Patient's Chart, lab work & pertinent test results  Airway Mallampati: IV  TM Distance: >3 FB Neck ROM: Full    Dental no notable dental hx.    Pulmonary former smoker,    Pulmonary exam normal breath sounds clear to auscultation       Cardiovascular negative cardio ROS Normal cardiovascular exam Rhythm:Regular Rate:Normal     Neuro/Psych negative neurological ROS  negative psych ROS   GI/Hepatic negative GI ROS, Neg liver ROS,   Endo/Other  negative endocrine ROS  Renal/GU negative Renal ROS     Musculoskeletal negative musculoskeletal ROS (+)   Abdominal (+) + obese,   Peds  Hematology negative hematology ROS (+)   Anesthesia Other Findings LEFT BREAST CANCER  Reproductive/Obstetrics                            Anesthesia Physical Anesthesia Plan  ASA: III  Anesthesia Plan: General and Regional   Post-op Pain Management: GA combined w/ Regional for post-op pain   Induction: Intravenous  PONV Risk Score and Plan: 3 and Midazolam, Dexamethasone, Ondansetron and Treatment may vary due to age or medical condition  Airway Management Planned: Oral ETT and Video Laryngoscope Planned  Additional Equipment:   Intra-op Plan:   Post-operative Plan: Extubation in OR  Informed Consent: I have reviewed the patients History and Physical, chart, labs and discussed the procedure including the risks, benefits and alternatives for the proposed anesthesia with the patient or authorized representative who has indicated his/her understanding and acceptance.     Dental advisory given  Plan Discussed with: CRNA  Anesthesia Plan Comments:       Anesthesia Quick Evaluation

## 2018-11-22 NOTE — Op Note (Signed)
Preoperative diagnosis: 1.  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  Left breast invasive ductal cancer with extensive DCIS component Postoperative diagnosis: Same as above Procedure: 1.  Right breast radioactive seed guided excisional biopsy 2.  Left total mastectomy 3.  Left deep axillary sentinel lymph node biopsy Surgeon: Dr. Serita Grammes Assistant: Dr. [name] Drains: Per plastic surgery Estimated blood loss: 50 cc Specimens: 1.  Right breast tissue with seed present and clip marked with paint 2.  Left deep axillary sentinel lymph nodes 3.  Left mastectomy marked short superior, long lateral Complications: None Sponge and needle count was correct at completion Disposition to recovery in stable condition  Indications: This is a 53 year old female who underwent screening mammogram to have found to have a 10 cm area of calcifications in her upper outer left breast.  She is undergone 2 biopsies that show grade 1 invasive ductal carcinoma with DCIS in the other biopsy about 7.2 cm away was DCIS.  We discussed all of her options and she is elected to have total mastectomy with reconstruction and sentinel node.  On the right side she had a lesion present on mammography that on core biopsy is atypical ductal and atypical lobular hyperplasia.  We elected to excise this.  Procedure: After informed consent was obtained the patient had first gone to have a radioactive seed placed on the right side.  This was in good position.  She under went a left pectoral block.  SCDs were in place.  She was given antibiotics.  She was injected with technetium in the standard periareolar fashion.  She was then placed under general anesthesia without complication.  She was prepped and draped in the standard sterile surgical fashion.  A surgical timeout was then performed.  I first did the excisional biopsy on the right side.  I made a periareolar incision in order  to hide the scar later.  I used a lighted retractor to tunnel to the seed.  I then used the neoprobe to guide removal of the seed and the surrounding tissue.  The mammogram confirmed removal of the clip and the seed.  I then obtained hemostasis.  I placed a single clip at this site.  I then closed this with 2-0 Vicryl, 3-0 Vicryl, and 5-0 Monocryl.  I then made a triangular incision to encompass the nipple areolar complex.  I then created flaps on the left side to the parasternal area, inframammary fold, clavicle, and latissimus laterally.  I then remove the breast including the pectoralis fascia from the muscle.  This was then marked as above and passed off the table.  I was able to identify several sentinel lymph nodes through the same incision.  These were excised.  There was no background radioactivity.  Hemostasis was observed.  I then turned the case over to plastic surgery for reconstruction.

## 2018-11-22 NOTE — Op Note (Signed)
Operative Note   DATE OF OPERATION: 3.16.20  LOCATION: [facility]-observation  SURGICAL DIVISION: Plastic Surgery  PREOPERATIVE DIAGNOSES:  Left breast cancer UOQ ER+  POSTOPERATIVE DIAGNOSES:  same  PROCEDURE:  1. Left breast reconstruction with tissue expander 2. Acellular dermis ([employer]oderm) for breast reconstruction  SURGEON: [name] MD MBA  ASSISTANT: none  ANESTHESIA:  General.   EBL: 75 ml for entire procedure  COMPLICATIONS: None immediate.   INDICATIONS FOR PROCEDURE:  The patient, Jennifer Harrison, is a 53 y.o. female born on 22-Jul-1966, is here for immediate prepectoral expander acellular dermis breast reconstruction following skin reduction pattern mastectomy.   FINDINGS: Natrelle 133S FV-13-T 500 ml tissue expander placed, initial fill volume 410 ml air. SN [id number]  DESCRIPTION OF PROCEDURE:  The patient was marked with the patient in the preoperative area to mark sternal notch, chest midline, anterior axillary lineand inframammary fold. Patient was marked for skin reduction mastectomy with most superior portion nipple areola marked on breast meridian. Vertical limbs marked by breast displacement and set at9cm length. The patient was taken to the operating room. SCDs were placed and IV antibiotics were administered. The patient's operative site was prepped and draped in a sterile fashion. A time out was performed and all information was confirmed to be correct. In supine position, the lateral limbs for resection marked and area over lower pole preserved as inferiorly based dermal pedicle. Skin de epithelialized in this area.  I asssisted in mastectomy and sentinel node biopsy with exposure and retraction.  Breast cavity was irrigated with solution containingpolymyxinand bacitracin. Hemostasis was ensured.Laterally the mastectomy flap over posterior axillary line was advanced anteriorly and the subcutaneous tissue and superficial fascia was secured to  pectoralis muscle and acellular dermis with 0-vicryl. A 19 Fr drain was placed in subcutaneous position laterally anda 15 Fr drain placed along inframammary foldof breast cavity. Eachsecured to skin with 2-0 nylon. Cavity irrigated with Betadine.The tissue expanderwasprepared on back table prior in insertion. The expander was filled with air to44ml. Perforated acellular dermis was draped over anterior surface expander. The ADM was then secured to itself over posterior surface of expanderwith 4-0 chromic suture. Redundant folds acellular dermis excised so that the ADM lie flat without folds over air filled expander.The expander was secured to pectoralis with a0 vicryl.The ADM was secured to pectoralis muscle and chest wall along inferior border at inframammary foldwith 0 V lock suture.The inferiorly based dermal pedicle was redraped superiorly over expander and acellular dermiswithinterrupted 0 vicryl. Skin closure completedwith 3-0 vicryl in fascial layer and 4-0 vicryl in dermis. Skin closure completed with 4-0 monocryl subcuticular and tissue adhesive.  Transparent, adherent dressings applied bilateral.Dry dressing and breast binder applied.  The patient was allowed to wake from anesthesia, extubated and taken to the recovery room in satisfactory condition.   SPECIMENS: none  DRAINS: 15 and 19 Fr JP in left breast reconstruction  [name], MD [facility] Plastic & Reconstructive Surgery [phone], pin [phone]

## 2018-11-22 NOTE — Anesthesia Procedure Notes (Addendum)
Anesthesia Regional Block: Pectoralis block   Pre-Anesthetic Checklist: ,, timeout performed, Correct Patient, Correct Site, Correct Laterality, Correct Procedure,, site marked, risks and benefits discussed, Surgical consent,  Pre-op evaluation,  At surgeon's request and post-op pain management  Laterality: Left  Prep: chloraprep       Needles:  Injection technique: Single-shot  Needle Type: Echogenic Stimulator Needle     Needle Length: 10cm  Needle Gauge: 21     Additional Needles:   Procedures:,,,, ultrasound used (permanent image in chart),,,,  Narrative:  Start time: 11/22/2018 7:20 AM End time: 11/22/2018 7:30 AM Injection made incrementally with aspirations every 5 mL.  Performed by: Personally  Anesthesiologist: Murvin Natal, MD  Additional Notes: Functioning IV was confirmed and monitors were applied.  A 156mm 21ga [place] echogenic stimulator needle was used. Sterile prep and drape,hand hygiene and sterile gloves were used.  Negative aspiration and negative test dose prior to incremental administration of local anesthetic. The patient tolerated the procedure well.

## 2018-11-22 NOTE — Anesthesia Postprocedure Evaluation (Signed)
Anesthesia Post Note  Patient: MARYFER TAUZIN  Procedure(s) Performed: LEFT TOTAL MASTECTOMY WITH LEFT AXILLARY SENTINEL LYMPH NODE BIOPSY (Left Breast) RIGHT BREAST RADIOACTIVE SEED GUIDED EXCISIONAL BIOPSY (Right Breast) LEFT BREAST RECONSTRUCTION WITH TISSUE EXPANDER AND ACELLULAR DERMIS (Left )     Patient location during evaluation: PACU Anesthesia Type: Regional and General Level of consciousness: awake and alert Pain management: pain level controlled Vital Signs Assessment: post-procedure vital signs reviewed and stable Respiratory status: spontaneous breathing, nonlabored ventilation, respiratory function stable and patient connected to nasal cannula oxygen Cardiovascular status: blood pressure returned to baseline and stable Postop Assessment: no apparent nausea or vomiting Anesthetic complications: no    Last Vitals:  Vitals:   11/22/18 1235 11/22/18 1300  BP:  (!) 141/95  Pulse: (!) 108 98  Resp:  16  Temp:  36.6 C  SpO2:  95%    Last Pain:  Vitals:   11/22/18 1300  TempSrc:   PainSc: 2                  Calan Doren P Renee Beale

## 2018-11-22 NOTE — H&P (Signed)
53 yof referred by Dr Isidore Moos for new left breast cancer. She has pos fh. she has no real prior breast history. she had no mass or dc. she was noted on screening mm to have large area of calcs on the left side. this is 10 cm area of calcs in the upper outer left breast. US of the axilla is negative. she has undergone two biopsies of the left breast with clips that are 7.2 cm apart. 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 the anterior biopsy is dcis. on the right side she had some calcifications that underwent core and this is adh and alh. [place] right axilla negative. she is here with her husband to discuss options she has previously undergone panel genetic testing and this is negative. she did this due to fh. we discussed at [facility] mastectomy on left and mastectomy vs ex bx on right side. she has since seen Dr Iran Planas and desires srm on left with sn and expander recon and ex bx with seed guidance on right. no other changes  Past Surgical History Cesarean Section - 1   Diagnostic Studies History  Colonoscopy  never Mammogram  within last year Pap Smear  1-5 years ago  Medication History  Medications Reconciled  Social History  Alcohol use  Occasional alcohol use. Caffeine use  Coffee. Tobacco use  Former smoker.  Family History  Breast Cancer  Family Members In General, Mother. Cancer  Family Members In General. Diabetes Mellitus  Father. Heart Disease  Father. Heart disease in female family member before age 51   Pregnancy / Birth History  Age at menarche  53 years. Gravida  1 Irregular periods  Maternal age  24-30 Para  1  Other Problems Asthma  Breast Cancer  Lump In Breast    Review of Systems  General Not Present- Appetite Loss, Chills, Fatigue, Fever, Night Sweats, Weight Gain and Weight Loss. Skin Not Present- Change in Wart/Mole, Dryness, Hives, Jaundice, New Lesions, Non-Healing Wounds, Rash and  Ulcer. HEENT Not Present- Earache, Hearing Loss, Hoarseness, Nose Bleed, Oral Ulcers, Ringing in the Ears, Seasonal Allergies, Sinus Pain, Sore Throat, Visual Disturbances, Wears glasses/contact lenses and Yellow Eyes. Respiratory Present- Snoring. Not Present- Bloody sputum, Chronic Cough, Difficulty Breathing and Wheezing. Breast Not Present- Breast Mass, Breast Pain, Nipple Discharge and Skin Changes. Cardiovascular Not Present- Chest Pain, Difficulty Breathing Lying Down, Leg Cramps, Palpitations, Rapid Heart Rate, Shortness of Breath and Swelling of Extremities. Gastrointestinal Not Present- Abdominal Pain, Bloating, Bloody Stool, Change in Bowel Habits, Chronic diarrhea, Constipation, Difficulty Swallowing, Excessive gas, Gets full quickly at meals, Hemorrhoids, Indigestion, Nausea, Rectal Pain and Vomiting. Female Genitourinary Not Present- Frequency, Nocturia, Painful Urination, Pelvic Pain and Urgency. Musculoskeletal Not Present- Back Pain, Joint Pain, Joint Stiffness, Muscle Pain, Muscle Weakness and Swelling of Extremities. Neurological Not Present- Decreased Memory, Fainting, Headaches, Numbness, Seizures, Tingling, Tremor, Trouble walking and Weakness. Psychiatric Not Present- Anxiety, Bipolar, Change in Sleep Pattern, Depression, Fearful and Frequent crying. Endocrine Present- Hot flashes. Not Present- Cold Intolerance, Excessive Hunger, Hair Changes, Heat Intolerance and New Diabetes. Hematology Not Present- Blood Thinners, Easy Bruising, Excessive bleeding, Gland problems, HIV and Persistent Infections.   Physical Exam General Mental Status-Alert. Head and Neck Trachea-midline. Thyroid Gland Characteristics - normal size and consistency. Eye Sclera/Conjunctiva - Bilateral-No scleral icterus. Chest and Lung Exam Chest and lung exam reveals -quiet, even and easy respiratory effort with no use of accessory muscles and  on auscultation, normal breath sounds, no  adventitious sounds and normal vocal resonance. Breast Nipples-No Discharge. Breast Lump-No Palpable Breast Mass. Cardiovascular Cardiovascular examination reveals -normal heart sounds, regular rate and rhythm with no murmurs. Abdomen Note: soft no hepatomegaly Neurologc Neurologic evaluation reveals -alert and oriented x 3 with no impairment of recent or remote memory. Lymphatic Head & Neck General Head & Neck Lymphatics: Bilateral - Description - Normal. Axillary General Axillary Region: Bilateral - Description - Normal. Note: no Rulo adenopathy  BREAST CANCER OF UPPER-OUTER QUADRANT OF LEFT FEMALE BREAST (C50.412) Story: Left srm, left ax sn biopsy, right breast seed guided excisional biopsy we discussed surgery, risks, overnight stay. with right side if there is any cancer there may need more surgery and she understands upgrade rate with that.

## 2018-11-22 NOTE — Interval H&P Note (Signed)
History and Physical Interval Note:  11/22/2018 7:22 AM  Jennifer Harrison  has presented today for surgery, with the diagnosis of LEFT BREAST CANCER.  The various methods of treatment have been discussed with the patient and family. After consideration of risks, benefits and other options for treatment, the patient has consented to  Procedure(s): LEFT TOTAL MASTECTOMY WITH LEFT AXILLARY SENTINEL LYMPH NODE BIOPSY (Left) RIGHT BREAST RADIOACTIVE SEED GUIDED EXCISIONAL BIOPSY (Right) LEFT BREAST RECONSTRUCTION WITH TISSUE EXPANDER AND ACELLULAR DERMIS (Left) POSSIBLE RIGHT BREAST RECONSTRUCTION WITH TISSUE EXPANDER AND ACELUULUAR DERMIS (Right) as a surgical intervention.  The patient's history has been reviewed, patient examined, no change in status, stable for surgery.  I have reviewed the patient's chart and labs.  Questions were answered to the patient's satisfaction.     Rolm Bookbinder

## 2018-11-22 NOTE — Progress Notes (Signed)
Assisted Dr. Ellender with left, ultrasound guided, pectoralis block. Side rails up, monitors on throughout procedure. See vital signs in flow sheet. Tolerated Procedure well. 

## 2018-11-22 NOTE — Progress Notes (Signed)
Assisted Jennifer Harrison, nuc med tech, with nuc med injections. Side rails up, monitors on throughout procedure. See vital signs in flow sheet. Tolerated Procedure well. 

## 2018-11-22 NOTE — Transfer of Care (Signed)
Immediate Anesthesia Transfer of Care Note  Patient: Jennifer Harrison  Procedure(s) Performed: LEFT TOTAL MASTECTOMY WITH LEFT AXILLARY SENTINEL LYMPH NODE BIOPSY (Left Breast) RIGHT BREAST RADIOACTIVE SEED GUIDED EXCISIONAL BIOPSY (Right Breast) LEFT BREAST RECONSTRUCTION WITH TISSUE EXPANDER AND ACELLULAR DERMIS (Left ) POSSIBLE RIGHT BREAST RECONSTRUCTION WITH TISSUE EXPANDER AND ACELUULUAR DERMIS (Right )  Patient Location: PACU  Anesthesia Type:General  Level of Consciousness: awake, alert  and oriented  Airway & Oxygen Therapy: Patient Spontanous Breathing and Patient connected to face mask oxygen  Post-op Assessment: Report given to RN and Post -op Vital signs reviewed and stable  Post vital signs: Reviewed and stable  Last Vitals:  Vitals Value Taken Time  BP    Temp    Pulse    Resp    SpO2      Last Pain:  Vitals:   11/22/18 0641  TempSrc: Oral  PainSc: 0-No pain         Complications: No apparent anesthesia complications

## 2018-11-22 NOTE — Interval H&P Note (Signed)
History and Physical Interval Note:  11/22/2018 6:58 AM  Jennifer Harrison  has presented today for surgery, with the diagnosis of LEFT BREAST CANCER.  The various methods of treatment have been discussed with the patient and family. After consideration of risks, benefits and other options for treatment, the patient has consented to  Left breast reconstruction with tissue expander, acellular dermis as a surgical intervention.  The patient's history has been reviewed, patient examined, no change in status, stable for surgery.  I have reviewed the patient's chart and labs.  Questions were answered to the patient's satisfaction.     Jennifer Harrison

## 2018-11-23 ENCOUNTER — Encounter (HOSPITAL_BASED_OUTPATIENT_CLINIC_OR_DEPARTMENT_OTHER): Payer: Self-pay | Admitting: General Surgery

## 2018-11-23 DIAGNOSIS — C50412 Malignant neoplasm of upper-outer quadrant of left female breast: Secondary | ICD-10-CM | POA: Diagnosis not present

## 2018-11-23 NOTE — Addendum Note (Signed)
Addendum  created 11/23/18 5041 by Dorothy Landgrebe, Ernesta Amble, CRNA   Charge Capture section accepted

## 2018-11-23 NOTE — Discharge Summary (Signed)
Physician Discharge Summary  Patient ID: Jennifer Harrison MRN: [id number] DOB/AGE: [birth year] 51 y.o.  Admit date: 11/22/2018 Discharge date: 11/23/2018  Admission Diagnoses: Left breast cancer  Discharge Diagnoses:  Active Problems:   Breast cancer, left breast ([place])   Discharged Condition: stable  Hospital Course: Postoperatively patient did well with pain controlled with oral pain medication, tolerating diet and ambulating with minimal assist.  Treatments: surgery: right lumpectomy, left mastectomy sentinel node left breast reconstruction with tissue expander acellular dermis 3.16.20  Discharge Exam: Blood pressure (!) 151/90, pulse 73, temperature 97.9 F (36.6 C), resp. rate 16, height 5' 3.75" (1.619 m), weight 103 kg, last menstrual period 07/14/2017, SpO2 97 %. Incision/Wound: incisions intact chest soft Tegaderms in place drains serosanguinous  Disposition: Discharge disposition: 01-Home or Self Care       Discharge Instructions    Call MD for:  redness, tenderness, or signs of infection (pain, swelling, bleeding, redness, odor or green/yellow discharge around incision site)   Complete by:  As directed    Call MD for:  temperature >100.5   Complete by:  As directed    Discharge instructions   Complete by:  As directed    Ok to remove dressings and shower am 3.18.20. Soap and water ok, pat Tegaderms dry. Do not remove Tegaderms. No creams or ointments over incisions. Do not let drains dangle in shower, attach to lanyard or similar.Strip and record drains twice daily and bring log to clinic visit.  Breast binder or soft compression bra all other times.  Ok to raise arms above shoulders for bathing and dressing.  No house yard work or exercise until cleared by MD.   Recommend ibuprofen as directed with meals to aid with pain control. Ok to also use Tylenol for pain control. Recommend Miralax or Dulcolax as needed for constipation.   Driving Restrictions   Complete  by:  As directed    No driving for 2 weeks then no driving if taking narcotics   Lifting restrictions   Complete by:  As directed    No lifting > 5 lbs until cleared by MD   Resume previous diet   Complete by:  As directed      Allergies as of 11/23/2018      Reactions   Sulfa Antibiotics Other (See Comments)   Pt unsure   Penicillins Rash      Medication List    TAKE these medications   doxycycline 50 MG capsule Commonly known as:  VIBRAMYCIN Take 1 capsule (50 mg total) by mouth 2 (two) times daily.   methocarbamol 500 MG tablet Commonly known as:  ROBAXIN Take 1 tablet (500 mg total) by mouth every 8 (eight) hours as needed for muscle spasms.   multivitamin tablet Take 1 tablet by mouth daily.   oxyCODONE 5 MG immediate release tablet Commonly known as:  Oxy IR/ROXICODONE Take 1-2 tablets (5-10 mg total) by mouth every 4 (four) hours as needed for moderate pain.   tamoxifen 20 MG tablet Commonly known as:  NOLVADEX Take one tablet daily   VITAMIN D PO Take by mouth.      Follow-up Information    Rolm Bookbinder, MD In 2 weeks.   Specialty:  General Surgery Contact information: [address] [place] [address] [phone]        [name], MD In 1 week.   Specialty:  Plastic Surgery Why:  as scheduled Contact information: [place]  [place] [phone]           Signed: [name] 11/23/2018, 7:23 AM

## 2018-11-23 NOTE — Discharge Instructions (Signed)
[facility] °[phone] ° °MASTECTOMY: POST OP INSTRUCTIONS °Take 400 mg of ibuprofen every 8 hours or 650 mg tylenol every 6 hours for next 72 hours then as needed. Use ice several times daily also. °Always review your discharge instruction sheet given to you by the facility where your surgery was performed. ° °IF YOU HAVE DISABILITY OR FAMILY LEAVE FORMS, YOU MUST BRING THEM TO THE OFFICE FOR PROCESSING.   °DO NOT GIVE THEM TO YOUR DOCTOR. °A prescription for pain medication may be given to you upon discharge.  Take your pain medication as prescribed, if needed.  If narcotic pain medicine is not needed, then you may take acetaminophen (Tylenol), naprosyn (Alleve) or ibuprofen (Advil) as needed. °1. Take your usually prescribed medications unless otherwise directed. °2. If you need a refill on your pain medication, please contact your pharmacy.  They will contact our office to request authorization.  Prescriptions will not be filled after 5pm or on week-ends. °3. You should follow a light diet the first few days after arrival home, such as soup and crackers, etc.  Resume your normal diet the day after surgery. °4. Most patients will experience some swelling and bruising on the chest and underarm.  Ice packs will help.  Swelling and bruising can take several days to resolve. Wear the binder day and night until you return to the office.  °5. It is common to experience some constipation if taking pain medication after surgery.  Increasing fluid intake and taking a stool softener (such as Colace) will usually help or prevent this problem from occurring.  A mild laxative (Milk of Magnesia or Miralax) should be taken according to package instructions if there are no bowel movements after 48 hours. °6. Unless discharge instructions indicate otherwise, leave your bandage dry and in place until your next appointment in 3-5 days.  You may take a limited sponge bath.  No tube baths or showers until the  drains are removed.  You may have steri-strips (small skin tapes) in place directly over the incision.  These strips should be left on the skin for 7-10 days. If you have glue it will come off in next couple week.  Any sutures will be removed at an office visit °7. DRAINS:  If you have drains in place, it is important to keep a list of the amount of drainage produced each day in your drains.  Before leaving the hospital, you should be instructed on drain care.  Call our office if you have any questions about your drains. I will remove your drains when they put out less than 30 cc or ml for 2 consecutive days. °8. ACTIVITIES:  You may resume regular (light) daily activities beginning the next day--such as daily self-care, walking, climbing stairs--gradually increasing activities as tolerated.  You may have sexual intercourse when it is comfortable.  Refrain from any heavy lifting or straining until approved by your doctor. °a. You may drive when you are no longer taking prescription pain medication, you can comfortably wear a seatbelt, and you can safely maneuver your car and apply brakes. °b. RETURN TO WORK:  __________________________________________________________ °9. You should see your doctor in the office for a follow-up appointment approximately 3-5 days after your surgery.  Your doctor’s nurse will typically make your follow-up appointment when she calls you with your pathology report.  Expect your pathology report 3-4business days after surgery. °10. OTHER INSTRUCTIONS: ______________________________________________________________________________________________ ____________________________________________________________________________________________ °WHEN TO CALL YOUR DR [place]: °1. Fever over 101.0 °  Nausea and/or vomiting 3. Extreme swelling or bruising 4. Continued bleeding from incision. 5. Increased pain, redness, or drainage from the incision. The clinic staff is available to answer your  questions during regular business hours.  Please don't hesitate to call and ask to speak to one of the nurses for clinical concerns.  If you have a medical emergency, go to the nearest emergency room or call 911.  A surgeon from [facility] is always on call at the hospital. [address], [address], [place], [place]  [address] ? [address] [address], [place], [place]   [address] [phone] ? [phone] ? FAX [phone] Web site: [email or web address]   About my Jackson-Pratt Bulb Drain  What is a Jackson-Pratt bulb? A Jackson-Pratt is a soft, round device used to collect drainage. It is connected to a long, thin drainage catheter, which is held in place by one or two small stiches near your surgical incision site. When the bulb is squeezed, it forms a vacuum, forcing the drainage to empty into the bulb.  Emptying the Jackson-Pratt bulb- To empty the bulb: 1. Release the plug on the top of the bulb. 2. Pour the bulb's contents into a measuring container which your nurse will provide. 3. Record the time emptied and amount of drainage. Empty the drain(s) as often as your     doctor or nurse recommends.  Date                  Time                    Amount (Drain 1)                 Amount (Drain 2)  _____________________________________________________________________  _____________________________________________________________________  _____________________________________________________________________  _____________________________________________________________________  _____________________________________________________________________  _____________________________________________________________________  _____________________________________________________________________  _____________________________________________________________________  Squeezing the Jackson-Pratt Bulb- To squeeze the bulb: 1. Make sure the plug at the top of the bulb  is open. 2. Squeeze the bulb tightly in your fist. You will hear air squeezing from the bulb. 3. Replace the plug while the bulb is squeezed. 4. Use a safety pin to attach the bulb to your clothing. This will keep the catheter from     pulling at the bulb insertion site.  When to call your doctor- Call your doctor if:  Drain site becomes red, swollen or hot.  You have a fever greater than 101 degrees F.  There is oozing at the drain site.  Drain falls out (apply a guaze bandage over the drain hole and secure it with tape).  Drainage increases daily not related to activity patterns. (You will usually have more drainage when you are active than when you are resting.)  Drainage has a bad odor.   

## 2018-11-25 ENCOUNTER — Ambulatory Visit: Payer: No Typology Code available for payment source | Admitting: Oncology

## 2018-11-29 DIAGNOSIS — Z9012 Acquired absence of left breast and nipple: Secondary | ICD-10-CM | POA: Insufficient documentation

## 2018-12-03 ENCOUNTER — Inpatient Hospital Stay (HOSPITAL_COMMUNITY): Payer: No Typology Code available for payment source

## 2018-12-03 ENCOUNTER — Emergency Department (HOSPITAL_COMMUNITY): Payer: No Typology Code available for payment source

## 2018-12-03 ENCOUNTER — Encounter (HOSPITAL_COMMUNITY): Payer: Self-pay | Admitting: Emergency Medicine

## 2018-12-03 ENCOUNTER — Inpatient Hospital Stay (HOSPITAL_COMMUNITY)
Admission: EM | Admit: 2018-12-03 | Discharge: 2018-12-07 | DRG: 175 | Disposition: A | Discharge status: Home / Self Care | Payer: No Typology Code available for payment source | Attending: Internal Medicine | Admitting: Internal Medicine

## 2018-12-03 ENCOUNTER — Other Ambulatory Visit: Payer: Self-pay

## 2018-12-03 DIAGNOSIS — Z8249 Family history of ischemic heart disease and other diseases of the circulatory system: Secondary | ICD-10-CM | POA: Diagnosis not present

## 2018-12-03 DIAGNOSIS — Z87891 Personal history of nicotine dependence: Secondary | ICD-10-CM | POA: Diagnosis not present

## 2018-12-03 DIAGNOSIS — C50412 Malignant neoplasm of upper-outer quadrant of left female breast: Secondary | ICD-10-CM

## 2018-12-03 DIAGNOSIS — Z880 Allergy status to penicillin: Secondary | ICD-10-CM

## 2018-12-03 DIAGNOSIS — I2699 Other pulmonary embolism without acute cor pulmonale: Principal | ICD-10-CM

## 2018-12-03 DIAGNOSIS — Z170 Estrogen receptor positive status [ER+]: Secondary | ICD-10-CM

## 2018-12-03 DIAGNOSIS — J9601 Acute respiratory failure with hypoxia: Secondary | ICD-10-CM | POA: Diagnosis present

## 2018-12-03 DIAGNOSIS — Z833 Family history of diabetes mellitus: Secondary | ICD-10-CM | POA: Diagnosis not present

## 2018-12-03 DIAGNOSIS — Z9012 Acquired absence of left breast and nipple: Secondary | ICD-10-CM | POA: Diagnosis not present

## 2018-12-03 DIAGNOSIS — Z79899 Other long term (current) drug therapy: Secondary | ICD-10-CM | POA: Diagnosis not present

## 2018-12-03 DIAGNOSIS — R0600 Dyspnea, unspecified: Secondary | ICD-10-CM

## 2018-12-03 DIAGNOSIS — J9811 Atelectasis: Secondary | ICD-10-CM | POA: Diagnosis present

## 2018-12-03 DIAGNOSIS — Z86711 Personal history of pulmonary embolism: Secondary | ICD-10-CM

## 2018-12-03 DIAGNOSIS — Z807 Family history of other malignant neoplasms of lymphoid, hematopoietic and related tissues: Secondary | ICD-10-CM | POA: Diagnosis not present

## 2018-12-03 DIAGNOSIS — Z882 Allergy status to sulfonamides status: Secondary | ICD-10-CM | POA: Diagnosis not present

## 2018-12-03 DIAGNOSIS — Z79810 Long term (current) use of selective estrogen receptor modulators (SERMs): Secondary | ICD-10-CM

## 2018-12-03 DIAGNOSIS — Z79891 Long term (current) use of opiate analgesic: Secondary | ICD-10-CM | POA: Diagnosis not present

## 2018-12-03 DIAGNOSIS — Z803 Family history of malignant neoplasm of breast: Secondary | ICD-10-CM

## 2018-12-03 LAB — CBC WITH DIFFERENTIAL/PLATELET
Abs Immature Granulocytes: 0.08 10*3/uL — ABNORMAL HIGH (ref 0.00–0.07)
BASOS ABS: 0 10*3/uL (ref 0.0–0.1)
Basophils Relative: 0 %
Eosinophils Absolute: 0.1 10*3/uL (ref 0.0–0.5)
Eosinophils Relative: 1 %
HEMATOCRIT: 36 % (ref 36.0–46.0)
Hemoglobin: 11.6 g/dL — ABNORMAL LOW (ref 12.0–15.0)
Immature Granulocytes: 1 %
LYMPHS ABS: 1.3 10*3/uL (ref 0.7–4.0)
Lymphocytes Relative: 8 %
MCH: 29.2 pg (ref 26.0–34.0)
MCHC: 32.2 g/dL (ref 30.0–36.0)
MCV: 90.7 fL (ref 80.0–100.0)
Monocytes Absolute: 1.3 10*3/uL — ABNORMAL HIGH (ref 0.1–1.0)
Monocytes Relative: 8 %
Neutro Abs: 14.4 10*3/uL — ABNORMAL HIGH (ref 1.7–7.7)
Neutrophils Relative %: 82 %
Platelets: 352 10*3/uL (ref 150–400)
RBC: 3.97 MIL/uL (ref 3.87–5.11)
RDW: 12.2 % (ref 11.5–15.5)
WBC: 17.2 10*3/uL — AB (ref 4.0–10.5)
nRBC: 0 % (ref 0.0–0.2)

## 2018-12-03 LAB — COMPREHENSIVE METABOLIC PANEL
ALT: 23 U/L (ref 0–44)
AST: 20 U/L (ref 15–41)
Albumin: 4.1 g/dL (ref 3.5–5.0)
Alkaline Phosphatase: 59 U/L (ref 38–126)
Anion gap: 10 (ref 5–15)
BILIRUBIN TOTAL: 0.5 mg/dL (ref 0.3–1.2)
BUN: 13 mg/dL (ref 6–20)
CO2: 29 mmol/L (ref 22–32)
CREATININE: 0.64 mg/dL (ref 0.44–1.00)
Calcium: 9.6 mg/dL (ref 8.9–10.3)
Chloride: 100 mmol/L (ref 98–111)
GFR calc Af Amer: >60 mL/min (ref >60)
Glucose, Bld: 114 mg/dL — ABNORMAL HIGH (ref 70–99)
Potassium: 3.7 mmol/L (ref 3.5–5.1)
Sodium: 139 mmol/L (ref 135–145)
Total Protein: 8.2 g/dL — ABNORMAL HIGH (ref 6.5–8.1)

## 2018-12-03 LAB — HEPARIN LEVEL (UNFRACTIONATED)
Heparin Unfractionated: 0.38 IU/mL (ref 0.30–0.70)
Heparin Unfractionated: 0.12 IU/mL — ABNORMAL LOW (ref 0.30–0.70)

## 2018-12-03 LAB — PROTIME-INR
INR: 1.1 (ref 0.8–1.2)
Prothrombin Time: 14.1 seconds (ref 11.4–15.2)

## 2018-12-03 LAB — BRAIN NATRIURETIC PEPTIDE: B Natriuretic Peptide: 63 pg/mL (ref 0.0–100.0)

## 2018-12-03 LAB — TROPONIN I

## 2018-12-03 LAB — ECHOCARDIOGRAM LIMITED
Height: 63 in
Weight: 3633.18 oz

## 2018-12-03 LAB — APTT: aPTT: 33 seconds (ref 24–36)

## 2018-12-03 LAB — D-DIMER, QUANTITATIVE: D-Dimer, Quant: 1.96 ug/mL-FEU — ABNORMAL HIGH (ref 0.00–0.50)

## 2018-12-03 MED ORDER — ONDANSETRON HCL 4 MG PO TABS
4.0000 mg | ORAL_TABLET | Freq: Four times a day (QID) | ORAL | Status: DC | PRN
  Administered 2018-12-03: 4 mg via ORAL
  Filled 2018-12-03: qty 1

## 2018-12-03 MED ORDER — ACETAMINOPHEN 650 MG RE SUPP: 650.0000 mg | Freq: Four times a day (QID) | RECTAL | Status: DC | PRN

## 2018-12-03 MED ORDER — OXYCODONE HCL 5 MG PO TABS
5.0000 mg | ORAL_TABLET | ORAL | Status: DC | PRN
  Administered 2018-12-03: 10 mg via ORAL
  Administered 2018-12-03: 5 mg via ORAL
  Administered 2018-12-03 – 2018-12-06 (×9): 10 mg via ORAL
  Filled 2018-12-03 (×7): qty 2
  Filled 2018-12-03: qty 1
  Filled 2018-12-03 (×4): qty 2

## 2018-12-03 MED ORDER — ACETAMINOPHEN 325 MG PO TABS
650.0000 mg | ORAL_TABLET | Freq: Four times a day (QID) | ORAL | Status: DC | PRN
  Administered 2018-12-04 – 2018-12-05 (×4): 650 mg via ORAL
  Filled 2018-12-03 (×4): qty 2

## 2018-12-03 MED ORDER — ADULT MULTIVITAMIN W/MINERALS CH
1.0000 | ORAL_TABLET | Freq: Every day | ORAL | Status: DC
  Administered 2018-12-03 – 2018-12-07 (×5): 1 via ORAL
  Filled 2018-12-03 (×5): qty 1

## 2018-12-03 MED ORDER — ONDANSETRON HCL 4 MG/2ML IJ SOLN: 4.0000 mg | Freq: Four times a day (QID) | INTRAMUSCULAR | Status: DC | PRN

## 2018-12-03 MED ORDER — HEPARIN BOLUS VIA INFUSION
4000.0000 [IU] | Freq: Once | INTRAVENOUS | Status: AC
  Administered 2018-12-03: 4000 [IU] via INTRAVENOUS

## 2018-12-03 MED ORDER — ADULT MULTIVITAMIN W/MINERALS CH
1.0000 | ORAL_TABLET | Freq: Every day | ORAL | Status: DC
  Filled 2018-12-03 (×4): qty 1

## 2018-12-03 MED ORDER — HEPARIN (PORCINE) 25000 UT/250ML-% IV SOLN
1600.0000 [IU]/h | INTRAVENOUS | Status: DC
  Administered 2018-12-03 (×2): 1300 [IU]/h via INTRAVENOUS
  Administered 2018-12-04 – 2018-12-05 (×3): 1600 [IU]/h via INTRAVENOUS
  Filled 2018-12-03 (×5): qty 250

## 2018-12-03 MED ORDER — IOHEXOL 350 MG/ML SOLN
100.0000 mL | Freq: Once | INTRAVENOUS | Status: AC | PRN
  Administered 2018-12-03: 100 mL via INTRAVENOUS

## 2018-12-03 MED ORDER — METHOCARBAMOL 500 MG PO TABS
500.0000 mg | ORAL_TABLET | Freq: Four times a day (QID) | ORAL | Status: DC | PRN
  Administered 2018-12-03 – 2018-12-06 (×9): 500 mg via ORAL
  Filled 2018-12-03 (×9): qty 1

## 2018-12-03 NOTE — Progress Notes (Signed)
ANTICOAGULATION CONSULT NOTE - Preliminary  Pharmacy Consult for heparin Indication: pulmonary embolus  Allergies  Allergen Reactions  . Sulfa Antibiotics Other (See Comments)    Pt unsure  . Penicillins Rash    Did it involve swelling of the face/tongue/throat, SOB, or low BP? No Did it involve sudden or severe rash/hives, skin peeling, or any reaction on the inside of your mouth or nose? Yes Did you need to seek medical attention at a hospital or doctor's office? No When did it last happen?unknown If all above answers are "NO", may proceed with cephalosporin use.     Patient Measurements: Height: 5\' 3"  (160 cm) Weight: 227 lb 1.2 oz (103 kg) IBW/kg (Calculated) : 52.4 HEPARIN DW (KG): 76.7   Vital Signs: Temp: 98.4 F (36.9 C) (03/27 0932) Temp Source: Oral (03/27 0932) BP: 170/103 (03/27 0932) Pulse Rate: 99 (03/27 0932)  Labs: Recent Labs    12/03/18 0556 12/03/18 1401  HGB 11.6*  --   HCT 36.0  --   PLT 352  --   APTT 33  --   LABPROT 14.1  --   INR 1.1  --   HEPARINUNFRC  --  0.38  CREATININE 0.64  --   TROPONINI <0.03  --    Estimated Creatinine Clearance: 96.4 mL/min (by C-G formula based on SCr of 0.64 mg/dL).  Medical History: Past Medical History:  Diagnosis Date  . Family history of breast cancer     Medications:  Scheduled:  . multivitamin with minerals  1 tablet Oral Daily   Infusions:  . heparin 1,300 Units/hr (12/03/18 0746)   PRN:   Assessment: 53 year old female presented with SOB, elevated d-dimer, CT showed PE, starting heparin. No anticoag meds at home.  Level 0.38   Goal of Therapy:  Heparin level 0.3-0.7   Plan:  Continue heparin infusion at 1300 units/hr Check anti-Xa level in 6 hours and daily while on heparin Continue to monitor H&H and platelets Will confirm level in 6 hours    Seynabou Fults, RPH 12/03/2018,3:31 PM

## 2018-12-03 NOTE — ED Triage Notes (Signed)
Pt states she had a left sided myectomy and right sided lumpectomy on 11/22/2018 at [facility], pt reports she began having left sided back pain on Tuesday which has worsened until this morning, pt reports she is now having SOB as well, reports calling surgeon and was told to begin taking pain meds q6 instead of q8 and go to ED if having SOB

## 2018-12-03 NOTE — Progress Notes (Signed)
ANTICOAGULATION CONSULT NOTE - Preliminary  Pharmacy Consult for heparin Indication: pulmonary embolus  Allergies  Allergen Reactions  . Sulfa Antibiotics Other (See Comments)    Pt unsure  . Penicillins Rash    Patient Measurements: Height: 5' 3.75" (161.9 cm) Weight: 227 lb 1.2 oz (103 kg) IBW/kg (Calculated) : 54.13 HEPARIN DW (KG): 78.3   Vital Signs: Temp: 98.8 F (37.1 C) (03/27 0554) Temp Source: Oral (03/27 0554) BP: 154/95 (03/27 0630) Pulse Rate: 92 (03/27 0654)  Labs: Recent Labs    12/03/18 0556  HGB 11.6*  HCT 36.0  PLT 352  CREATININE 0.64  TROPONINI <0.03   Estimated Creatinine Clearance: 97.9 mL/min (by C-G formula based on SCr of 0.64 mg/dL).  Medical History: Past Medical History:  Diagnosis Date  . Family history of breast cancer     Medications:  Scheduled:  . heparin  4,000 Units Intravenous Once   Infusions:  . heparin     PRN:   Assessment: 53 year old female presented with SOB, elevated d-dimer, CT showed PE, starting heparin. No anticoag meds at home, labs pending.   Goal of Therapy:  Heparin level 0.3-0.7   Plan:  Give 4000 units bolus x 1 Start heparin infusion at 1300 units/hr Check anti-Xa level in 6 hours and daily while on heparin Continue to monitor H&H and platelets Preliminary review of pertinent patient information completed.  Forestine Na clinical pharmacist will complete review during morning rounds to assess the patient and finalize treatment regimen.  Nyra Capes, [facility] 12/03/2018,7:12 AM

## 2018-12-03 NOTE — Progress Notes (Signed)
*  PRELIMINARY RESULTS* Echocardiogram Limited 2-D Echocardiogram has been performed due to recent surgery limiting available windows.  Samuel Germany 12/03/2018, 1:41 PM

## 2018-12-03 NOTE — ED Notes (Signed)
Dr Tat in with the patient

## 2018-12-03 NOTE — H&P (Signed)
History and Physical  TINLEY ROUGHT HXT:[id number] DOB: 1965/10/27 DOA: 12/03/2018   PCP: Patient, [name]   Patient coming from: Home  Chief Complaint: dyspnea, chest discomfort  HPI:  Jennifer Harrison is a 53 y.o. female with no documented chronic medical problems presented with 2 to 3-day history of left shoulder discomfort, chest discomfort and shortness of breath.  The patient began having a feeling of "indigestion" on 11/30/2018.  She felt some discomfort in her left chest and left shoulder area.  On 12/01/2018, the patient began having some shortness of breath.  She felt like she could not take a deep breath.  She felt that it may have been the tubular compression she had on her chest.  She had a little relief when she took it off, but later in the evening she began having stabbing left shoulder blade chest discomfort and muscle spasm.  She increased her dose of her Robaxin without much relief.  Because of continued left shoulder blade pain as well as chest wall discomfort and shortness of breath, the patient presented for further evaluation.  The patient had 2 episodes of nausea and vomiting on 12/02/2018, but she denied any fevers, chills, coughing, sore throat, hemoptysis.  There is no diarrhea, hematochezia, melena, headache, dysuria, hematuria.  She denied any sick contacts or recent travels.  She has essentially stayed home since her mastectomy and lumpectomy on 11/22/2018. In the emergency department, the patient was afebrile hemodynamically stable, but she was hypoxic on room air saturating 84%.  She was placed on 4 L nasal cannula with oxygen saturation improving to 90%.  BMP, LFTs were unremarkable.  WBC was 17.2 with hemoglobin 11.6 and platelet 252,000.  D-dimer was 1.96.  CT angiogram of the chest showed multifocal subsegmental emboli in all the lobes of her lungs.  There was bibasilar atelectasis with a small left pleural effusion.  The patient was started on IV heparin.   Notably, the patient was recently diagnosed with breast cancer on a screening mammogram.  She underwent a right sided lumpectomy and left-sided mastectomy on 11/22/2018 performed by Dr. Donne Hazel.  At that time, she also had breast reconstruction that was performed by Dr. Iran Planas.  Assessment/Plan: Acute respiratory failure with hypoxia -Secondary to pulmonary embolus -Stable on 4 L nasal cannula -Wean oxygen as tolerated for saturation greater 92%  Acute pulmonary emboli -12/03/2018 CT angiogram chest--as described above -CT also noted a lingular pulmonary infarct -Continue IV heparin  Leukocytosis -Likely stress demargination -Patient is afebrile hemodynamically stable -Monitor clinically off antibiotics -Urinalysis  Invasive ductal carcinoma of the breast -Patient has follow-up with Dr. [name] -She also has follow-up with Dr. Iran Planas on 12/09/2018 -Holding tamoxifen for now postoperatively -Continue Robaxin and oxycodone for pain       Past Medical History:  Diagnosis Date  . Family history of breast cancer    Past Surgical History:  Procedure Laterality Date  . BREAST RECONSTRUCTION WITH PLACEMENT OF TISSUE EXPANDER AND ALLODERM Left 11/22/2018   Procedure: LEFT BREAST RECONSTRUCTION WITH TISSUE EXPANDER AND ACELLULAR DERMIS;  Surgeon: [name], MD;  Location: [place];  Service: Plastics;  Laterality: Left;  . Broken Wrist  1999  . CESAREAN SECTION    . endometrial biopsy  10/15  . MASTECTOMY W/ SENTINEL NODE BIOPSY Left 11/22/2018   Procedure: LEFT TOTAL MASTECTOMY WITH LEFT AXILLARY SENTINEL LYMPH NODE BIOPSY;  Surgeon: Rolm Bookbinder, MD;  Location: [place];  Service: General;  Laterality: Left;  . RADIOACTIVE SEED GUIDED EXCISIONAL BREAST BIOPSY Right 11/22/2018   Procedure: RIGHT BREAST RADIOACTIVE SEED GUIDED EXCISIONAL BIOPSY;  Surgeon: Rolm Bookbinder, MD;  Location: [place];  Service:  General;  Laterality: Right;   Social History:  reports that she quit smoking about 25 years ago. She has never used smokeless tobacco. She reports current alcohol use. She reports that she does not use drugs.   Family History  Problem Relation Age of Onset  . Breast cancer Mother 73       died 41; "negative genetic testing"  . Diabetes Father   . Hypertension Father   . Hydrocephalus Brother   . Breast cancer Maternal Grandmother 9       d. 36  . Breast cancer Paternal Grandmother 23       d. 67  . Breast cancer Paternal Aunt 71  . Breast cancer Paternal Aunt 14       "genetic testing negative"  . Other Maternal Grandfather        WWII  . Lymphoma Paternal Grandfather      Allergies  Allergen Reactions  . Sulfa Antibiotics Other (See Comments)    Pt unsure  . Penicillins Rash     Prior to Admission medications   Medication Sig Start Date End Date Taking? Authorizing Provider  doxycycline (VIBRAMYCIN) 50 MG capsule Take 1 capsule (50 mg total) by mouth 2 (two) times daily. 11/22/18   [name], MD  methocarbamol (ROBAXIN) 500 MG tablet Take 1 tablet (500 mg total) by mouth every 8 (eight) hours as needed for muscle spasms. 11/22/18   [name], MD  Multiple Vitamin (MULTIVITAMIN) tablet Take 1 tablet by mouth daily.      [provider]  oxyCODONE (OXY IR/ROXICODONE) 5 MG immediate release tablet Take 1-2 tablets (5-10 mg total) by mouth every 4 (four) hours as needed for moderate pain. 11/22/18   [name], MD  tamoxifen (NOLVADEX) 20 MG tablet Take one tablet daily 10/20/18   Magrinat, Virgie Dad, MD  VITAMIN D PO Take by mouth.    [provider]    Review of Systems:  Constitutional:  No weight loss, night sweats, Fevers, chills, fatigue.  Head&Eyes: No headache.  No vision loss.  No eye pain or scotoma ENT:  No Difficulty swallowing,Tooth/dental problems,Sore throat,  No ear ache, post nasal drip,  Cardio-vascular:   NoOrthopnea, PND, swelling in lower extremities,  dizziness, palpitations  GI:  No  abdominal pain, nausea, vomiting, diarrhea, loss of appetite, hematochezia, melena, heartburn, indigestion, Resp:  No cough. No coughing up of blood .No wheezing.No chest wall deformity  Skin:  no rash or lesions.  GU:  no dysuria, change in color of urine, no urgency or frequency. No flank pain.  Musculoskeletal:  No joint pain or swelling. No decreased range of motion.  Psych:  No change in mood or affect. No depression or anxiety. Neurologic: No headache, no dysesthesia, no focal weakness, no vision loss. No syncope  Physical Exam: Vitals:   12/03/18 0654 12/03/18 0700 12/03/18 0715 12/03/18 0730  BP:  (!) 134/91  (!) 180/112  Pulse: 92 87 97 100  Resp: (!) 26 (!) 25 (!) 24 20  Temp:      TempSrc:      SpO2: 98% 98% 98% 98%  Weight:      Height:       General:  A&O x 3, NAD, nontoxic, pleasant/cooperative Head/Eye: No conjunctival hemorrhage, no icterus, [place]/AT,  No nystagmus ENT:  No icterus,  No thrush, good dentition, no pharyngeal exudate Neck:  No masses, no lymphadenpathy, no bruits CV:  RRR, no rub, no gallop, no S3 Lung:  Left basilar crackles, no wheeze Abdomen: soft/NT, +BS, nondistended, no peritoneal signs Ext: No cyanosis, No rashes, No petechiae, No lymphangitis, No edema Neuro: CNII-XII intact, strength 4/5 in bilateral upper and lower extremities, no dysmetria  Labs on Admission:  Basic Metabolic Panel: Recent Labs  Lab 12/03/18 0556  NA 139  K 3.7  CL 100  CO2 29  GLUCOSE 114*  BUN 13  CREATININE 0.64  CALCIUM 9.6   Liver Function Tests: Recent Labs  Lab 12/03/18 0556  AST 20  ALT 23  ALKPHOS 59  BILITOT 0.5  PROT 8.2*  ALBUMIN 4.1   No results for input(s): LIPASE, AMYLASE in the last 168 hours. No results for input(s): AMMONIA in the last 168 hours. CBC: Recent Labs  Lab 12/03/18 0556  WBC 17.2*  NEUTROABS 14.4*  HGB 11.6*  HCT 36.0  MCV  90.7  PLT 352   Coagulation Profile: Recent Labs  Lab 12/03/18 0556  INR 1.1   Cardiac Enzymes: Recent Labs  Lab 12/03/18 0556  TROPONINI <0.03   BNP: Invalid input(s): POCBNP CBG: No results for input(s): GLUCAP in the last 168 hours. Urine analysis:    Component Value Date/Time   COLORURINE YELLOW 07/29/2013 1501   APPEARANCEUR CLEAR 07/29/2013 1501   LABSPEC 1.005 07/29/2013 1501   PHURINE 6.5 07/29/2013 1501   GLUCOSEU NEG 07/29/2013 1501   HGBUR NEG 07/29/2013 1501   BILIRUBINUR n 07/30/2016 0840   KETONESUR NEG 07/29/2013 1501   PROTEINUR n 07/30/2016 0840   PROTEINUR NEG 07/29/2013 1501   UROBILINOGEN negative 07/30/2016 0840   UROBILINOGEN 0.2 07/29/2013 1501   NITRITE n 07/30/2016 0840   NITRITE NEG 07/29/2013 1501   LEUKOCYTESUR Negative 07/30/2016 0840   Sepsis Labs: @LABRCNTIP (procalcitonin:4,lacticidven:4) )No results found for this or any previous visit (from the past 240 hour(s)).   Radiological Exams on Admission: Ct Angio Chest Pe W And/or Wo Contrast  Result Date: 12/03/2018 CLINICAL DATA:  Shortness of breath. Right-sided lumpectomy and left mastectomy 11/22/2018. EXAM: CT ANGIOGRAPHY CHEST WITH CONTRAST TECHNIQUE: Multidetector CT imaging of the chest was performed using the standard protocol during bolus administration of intravenous contrast. Multiplanar CT image reconstructions and MIPs were obtained to evaluate the vascular anatomy. CONTRAST:  128mL OMNIPAQUE IOHEXOL 350 MG/ML SOLN COMPARISON:  None. FINDINGS: Cardiovascular: Multifocal central branching pulmonary artery filling defects consistent with acute emboli, labeled on thin axial series. All lobes are affected by subsegmental clot. Segmental clot is seen within the left lower lobe. RV to LV ratio is between 0.8 and 0.9. Borderline heart size. No pericardial effusion. Negative aorta. Mediastinum/Nodes: Negative for adenopathy Lungs/Pleura: Streaky opacities in the lower lungs consistent with  atelectasis. There is also ground-glass opacity in the lingula more compatible with infarct. No pulmonary edema. Small left pleural effusion. Upper Abdomen: Negative Musculoskeletal: No acute or aggressive finding. Other: Changes of right lumpectomy and left-sided mastectomy with tissue expander. Critical Value/emergent results were called by telephone at the time of interpretation on 12/03/2018 at 6:59 am to Dr. Veryl Speak , who verbally acknowledged these results. Review of the MIP images confirms the above findings. IMPRESSION: 1. Multifocal acute segmental and subsegmental pulmonary emboli. Negative for right heart strain. 2. Atelectasis and lingular infarct. 3. Small left pleural effusion. Electronically Signed   By: Neva Seat.D.  On: 12/03/2018 07:00    EKG: Independently reviewed. Sinus, RSR' V1,V2    Time spent:60 minutes Code Status:   FULL Family Communication:  Significant other updated on phone Disposition Plan: expect 1-2 day hospitalization Consults called: none DVT Prophylaxis:  IV Heparin     Orson Eva, DO  [facility] Pager [phone]  If 7PM-7AM, please contact night-coverage [email or web address] Password [facility] 12/03/2018, 7:55 AM

## 2018-12-03 NOTE — Progress Notes (Signed)
ANTICOAGULATION CONSULT NOTE - Preliminary  Pharmacy Consult for heparin Indication: pulmonary embolus  Allergies  Allergen Reactions  . Sulfa Antibiotics Other (See Comments)    Pt unsure  . Penicillins Rash    Patient Measurements: Height: 5' 3.75" (161.9 cm) Weight: 227 lb 1.2 oz (103 kg) IBW/kg (Calculated) : 54.13 HEPARIN DW (KG): 78.3   Vital Signs: Temp: 98.8 F (37.1 C) (03/27 0554) Temp Source: Oral (03/27 0554) BP: 148/90 (03/27 0800) Pulse Rate: 91 (03/27 0800)  Labs: Recent Labs    12/03/18 0556  HGB 11.6*  HCT 36.0  PLT 352  APTT 33  LABPROT 14.1  INR 1.1  CREATININE 0.64  TROPONINI <0.03   Estimated Creatinine Clearance: 97.9 mL/min (by C-G formula based on SCr of 0.64 mg/dL).  Medical History: Past Medical History:  Diagnosis Date  . Family history of breast cancer     Medications:  Scheduled:   Infusions:  . heparin 1,300 Units/hr (12/03/18 0746)   PRN:   Assessment: 53 year old female presented with SOB, elevated d-dimer, CT showed PE, starting heparin. No anticoag meds at home.   Goal of Therapy:  Heparin level 0.3-0.7   Plan:  Give 4000 units bolus x 1 Start heparin infusion at 1300 units/hr Check anti-Xa level in 6 hours and daily while on heparin Continue to monitor H&H and platelets Heparin gtt started at 0800, will order first anti Xa level for 1400   Marce Charlesworth, RPH 12/03/2018,8:22 AM

## 2018-12-03 NOTE — ED Notes (Signed)
Patient transported to CT 

## 2018-12-03 NOTE — Progress Notes (Signed)
[place] for heparin Indication: pulmonary embolus  Allergies  Allergen Reactions  . Sulfa Antibiotics Other (See Comments)    Pt unsure  . Penicillins Rash    Did it involve swelling of the face/tongue/throat, SOB, or low BP? No Did it involve sudden or severe rash/hives, skin peeling, or any reaction on the inside of your mouth or nose? Yes Did you need to seek medical attention at a hospital or doctor's office? No When did it last happen?unknown If all above answers are "NO", may proceed with cephalosporin use.     Patient Measurements: Height: 5\' 3"  (160 cm) Weight: 227 lb 1.2 oz (103 kg) IBW/kg (Calculated) : 52.4 HEPARIN DW (KG): 76.7   Vital Signs: Temp: 98.3 F (36.8 C) (03/27 2033) Temp Source: Oral (03/27 2033) BP: 154/97 (03/27 2033) Pulse Rate: 98 (03/27 2033)  Labs: Recent Labs    12/03/18 0556 12/03/18 1401 12/03/18 2324  HGB 11.6*  --   --   HCT 36.0  --   --   PLT 352  --   --   APTT 33  --   --   LABPROT 14.1  --   --   INR 1.1  --   --   HEPARINUNFRC  --  0.38 0.12*  CREATININE 0.64  --   --   TROPONINI <0.03  --   --    Estimated Creatinine Clearance: 96.4 mL/min (by C-G formula based on SCr of 0.64 mg/dL).  Assessment: 53 y.o. female with PE for heparin  Goal of Therapy:  Heparin level 0.3-0.7   Plan:  Heparin 2000 units IV bolus, then increase heparin 1600 units/hr Follow-up am labs.   Caryl Pina, [facility] 12/03/2018,11:58 PM

## 2018-12-03 NOTE — ED Provider Notes (Signed)
[facility] EMERGENCY DEPARTMENT Provider Note   CSN: [id number] Arrival date & time: 12/03/18  7564    History   Chief Complaint Chief Complaint  Patient presents with  . Back Pain    HPI Jennifer Harrison is a 53 y.o. female.     Patient is a 53 year old female with history of recently diagnosed breast cancer.  She is approximately 10 days status post lumpectomy by Dr. Donne Hazel.  Patient did well with this procedure until 2 days ago when she began feeling poorly.  She reports generalized weakness followed by pain under her left shoulder blade.  She spoke with her surgeon yesterday who advised her to go to the ER if she developed difficulty breathing.  She felt short of breath today and comes for evaluation.  She denies fevers or chills.  She denies cough.  She denies any ill contacts.  The history is provided by the patient.  Back Pain  Location:  Thoracic spine Quality:  Stabbing Pain severity:  Moderate Pain is:  Same all the time Timing:  Constant Progression:  Worsening Chronicity:  New Relieved by:  Nothing Worsened by:  Deep breathing Ineffective treatments:  None tried   Past Medical History:  Diagnosis Date  . Family history of breast cancer     Patient Active Problem List   Diagnosis Date Noted  . Breast cancer, left breast ([place]) 11/22/2018  . Malignant neoplasm of upper-outer quadrant of left breast in female, estrogen receptor positive ([place]) 10/14/2018  . Genetic testing 12/15/2017  . Family history of breast cancer   . Family history of diabetes mellitus 07/29/2013    Past Surgical History:  Procedure Laterality Date  . BREAST RECONSTRUCTION WITH PLACEMENT OF TISSUE EXPANDER AND ALLODERM Left 11/22/2018   Procedure: LEFT BREAST RECONSTRUCTION WITH TISSUE EXPANDER AND ACELLULAR DERMIS;  Surgeon: [name], MD;  Location: [place];  Service: Plastics;  Laterality: Left;  . Broken Wrist  1999  . CESAREAN SECTION    .  endometrial biopsy  10/15  . MASTECTOMY W/ SENTINEL NODE BIOPSY Left 11/22/2018   Procedure: LEFT TOTAL MASTECTOMY WITH LEFT AXILLARY SENTINEL LYMPH NODE BIOPSY;  Surgeon: Rolm Bookbinder, MD;  Location: [place];  Service: General;  Laterality: Left;  . RADIOACTIVE SEED GUIDED EXCISIONAL BREAST BIOPSY Right 11/22/2018   Procedure: RIGHT BREAST RADIOACTIVE SEED GUIDED EXCISIONAL BIOPSY;  Surgeon: Rolm Bookbinder, MD;  Location: [place];  Service: General;  Laterality: Right;     OB History    Gravida  1   Para  1   Term  1   Preterm      AB      Living  1     SAB      TAB      Ectopic      Multiple      Live Births               Home Medications    Prior to Admission medications   Medication Sig Start Date End Date Taking? Authorizing Provider  doxycycline (VIBRAMYCIN) 50 MG capsule Take 1 capsule (50 mg total) by mouth 2 (two) times daily. 11/22/18   [name], MD  methocarbamol (ROBAXIN) 500 MG tablet Take 1 tablet (500 mg total) by mouth every 8 (eight) hours as needed for muscle spasms. 11/22/18   [name], MD  Multiple Vitamin (MULTIVITAMIN) tablet Take 1 tablet by mouth daily.      [provider]  oxyCODONE (OXY IR/ROXICODONE) 5 MG immediate release tablet Take 1-2 tablets (5-10 mg total) by mouth every 4 (four) hours as needed for moderate pain. 11/22/18   [name], MD  tamoxifen (NOLVADEX) 20 MG tablet Take one tablet daily 10/20/18   Magrinat, Virgie Dad, MD  VITAMIN D PO Take by mouth.    [provider]    Family History Family History  Problem Relation Age of Onset  . Breast cancer Mother 5       died 53; "negative genetic testing"  . Diabetes Father   . Hypertension Father   . Hydrocephalus Brother   . Breast cancer Maternal Grandmother 65       d. 67  . Breast cancer Paternal Grandmother 38       d. 17  . Breast cancer Paternal Aunt 71  . Breast cancer Paternal  Aunt 68       "genetic testing negative"  . Other Maternal Grandfather        WWII  . Lymphoma Paternal Grandfather     Social History Social History   Tobacco Use  . Smoking status: Former Smoker    Last attempt to quit: 09/08/1993    Years since quitting: 25.2  . Smokeless tobacco: Never Used  Substance Use Topics  . Alcohol use: Yes    Comment: 1-2 a month  . Drug use: No     Allergies   Sulfa antibiotics and Penicillins   Review of Systems Review of Systems  Musculoskeletal: Positive for back pain.  All other systems reviewed and are negative.    Physical Exam Updated Vital Signs BP (!) 172/86 (BP Location: Right Arm)   Pulse (!) 106   Temp 98.8 F (37.1 C) (Oral)   Resp (!) 25   Ht 5' 3.75" (1.619 m)   Wt 103 kg   SpO2 96%   BMI 39.28 kg/m   Physical Exam Vitals signs and nursing note reviewed.  Constitutional:      General: She is not in acute distress.    Appearance: She is well-developed. She is not diaphoretic.  HENT:     Head: Normocephalic and atraumatic.  Neck:     Musculoskeletal: Normal range of motion and neck supple.  Cardiovascular:     Rate and Rhythm: Regular rhythm. Tachycardia present.     Heart sounds: No murmur. No friction rub. No gallop.   Pulmonary:     Effort: Pulmonary effort is normal. No respiratory distress.     Breath sounds: Normal breath sounds. No wheezing.  Abdominal:     General: Bowel sounds are normal. There is no distension.     Palpations: Abdomen is soft.     Tenderness: There is no abdominal tenderness.  Musculoskeletal: Normal range of motion.  Skin:    General: Skin is warm and dry.  Neurological:     Mental Status: She is alert and oriented to person, place, and time.      ED Treatments / Results  Labs (all labs ordered are listed, but only abnormal results are displayed) Labs Reviewed  COMPREHENSIVE METABOLIC PANEL  CBC WITH DIFFERENTIAL/PLATELET  BRAIN NATRIURETIC PEPTIDE  TROPONIN I   D-DIMER, QUANTITATIVE (NOT AT [facility])    EKG EKG Interpretation  Date/Time:  Friday December 03 2018 05:47:18 EDT Ventricular Rate:  106 PR Interval:    QRS Duration: 93 QT Interval:  316 QTC Calculation: 420 R Axis:   3 Text Interpretation:  Sinus tachycardia RSR' in V1 or V2, right  VCD or RVH Confirmed by Veryl Speak [phone]) on 12/03/2018 6:02:28 AM   Radiology No results found.  Procedures Procedures (including critical care time)  Medications Ordered in ED Medications - No data to display   Initial Impression / Assessment and Plan / ED Course  I have reviewed the triage vital signs and the nursing notes.  Pertinent labs & imaging results that were available during my care of the patient were reviewed by me and considered in my medical decision making (see chart for details).  Patient with history of breast cancer status post lumpectomy 10 days ago presenting with chest pain and shortness of breath.  Patient was hypoxic with oxygen saturations in the mid 80s upon presentation.  She was also tachypneic and tachycardic.  Work-up reveals a positive d-dimer and CT scan which reveals bilateral pulmonary emboli without right heart strain.  Patient with oxygen requirement and will require admission.  She was started on heparin and will be admitted to the hospitalist service.  CRITICAL CARE Performed by: Veryl Speak Total critical care time: 45 minutes Critical care time was exclusive of separately billable procedures and treating other patients. Critical care was necessary to treat or prevent imminent or life-threatening deterioration. Critical care was time spent personally by me on the following activities: development of treatment plan with patient and/or surrogate as well as nursing, discussions with consultants, evaluation of patient's response to treatment, examination of patient, obtaining history from patient or surrogate, ordering and performing treatments and  interventions, ordering and review of laboratory studies, ordering and review of radiographic studies, pulse oximetry and re-evaluation of patient's condition.   Final Clinical Impressions(s) / ED Diagnoses   Final diagnoses:  None    ED Discharge Orders    None       Veryl Speak, MD 12/03/18 [phone]

## 2018-12-04 ENCOUNTER — Encounter (HOSPITAL_COMMUNITY): Payer: Self-pay

## 2018-12-04 LAB — BASIC METABOLIC PANEL
Anion gap: 9 (ref 5–15)
BUN: 13 mg/dL (ref 6–20)
CO2: 29 mmol/L (ref 22–32)
CREATININE: 0.57 mg/dL (ref 0.44–1.00)
Calcium: 8.8 mg/dL — ABNORMAL LOW (ref 8.9–10.3)
Chloride: 100 mmol/L (ref 98–111)
GFR calc Af Amer: >60 mL/min (ref >60)
GFR calc non Af Amer: >60 mL/min (ref >60)
Glucose, Bld: 96 mg/dL (ref 70–99)
Potassium: 3.8 mmol/L (ref 3.5–5.1)
Sodium: 138 mmol/L (ref 135–145)

## 2018-12-04 LAB — CBC
HCT: 32.7 % — ABNORMAL LOW (ref 36.0–46.0)
Hemoglobin: 10.3 g/dL — ABNORMAL LOW (ref 12.0–15.0)
MCH: 29.1 pg (ref 26.0–34.0)
MCHC: 31.5 g/dL (ref 30.0–36.0)
MCV: 92.4 fL (ref 80.0–100.0)
Platelets: 327 10*3/uL (ref 150–400)
RBC: 3.54 MIL/uL — ABNORMAL LOW (ref 3.87–5.11)
RDW: 12.2 % (ref 11.5–15.5)
WBC: 15 10*3/uL — ABNORMAL HIGH (ref 4.0–10.5)
nRBC: 0 % (ref 0.0–0.2)

## 2018-12-04 LAB — HEPARIN LEVEL (UNFRACTIONATED): Heparin Unfractionated: 0.5 IU/mL (ref 0.30–0.70)

## 2018-12-04 LAB — HIV ANTIBODY (ROUTINE TESTING W REFLEX): HIV Screen 4th Generation wRfx: NONREACTIVE

## 2018-12-04 MED ORDER — HEPARIN BOLUS VIA INFUSION
2000.0000 [IU] | Freq: Once | INTRAVENOUS | Status: AC
  Administered 2018-12-04: 2000 [IU] via INTRAVENOUS
  Filled 2018-12-04: qty 2000

## 2018-12-04 NOTE — Progress Notes (Signed)
[place] for heparin Indication: pulmonary embolus  Allergies  Allergen Reactions  . Sulfa Antibiotics Other (See Comments)    Pt unsure  . Penicillins Rash    Did it involve swelling of the face/tongue/throat, SOB, or low BP? No Did it involve sudden or severe rash/hives, skin peeling, or any reaction on the inside of your mouth or nose? Yes Did you need to seek medical attention at a hospital or doctor's office? No When did it last happen?unknown If all above answers are "NO", may proceed with cephalosporin use.     Patient Measurements: Height: 5\' 3"  (160 cm) Weight: 227 lb 1.2 oz (103 kg) IBW/kg (Calculated) : 52.4 HEPARIN DW (KG): 76.7   Vital Signs: Temp: 97.5 F (36.4 C) (03/28 0852) Temp Source: Axillary (03/28 0852) BP: 162/93 (03/28 0608) Pulse Rate: 93 (03/28 0608)  Labs: Recent Labs    12/03/18 0556 12/03/18 1401 12/03/18 2324 12/04/18 0819  HGB 11.6*  --   --  10.3*  HCT 36.0  --   --  32.7*  PLT 352  --   --  327  APTT 33  --   --   --   LABPROT 14.1  --   --   --   INR 1.1  --   --   --   HEPARINUNFRC  --  0.38 0.12* 0.50  CREATININE 0.64  --   --  0.57  TROPONINI <0.03  --   --   --    Estimated Creatinine Clearance: 96.4 mL/min (by C-G formula based on SCr of 0.57 mg/dL).  Assessment: 53 y.o. female with PE for heparin  Goal of Therapy:  Heparin level 0.3-0.7   Plan:  HL of 0.5 IU/mL is within therapeutic goal range this morning.  Continue  Heparin at 1600 units/hr Labs as appropriate  Despina Pole, [facility] 12/04/2018,10:13 AM

## 2018-12-04 NOTE — Progress Notes (Signed)
PROGRESS NOTE  Jennifer Harrison PRF:[id number] DOB: 1966/04/10 DOA: 12/03/2018 PCP: Patient, [name]  Brief History:  53 y.o. female with no documented chronic medical problems presented with 2 to 3-day history of left shoulder discomfort, chest discomfort and shortness of breath.  The patient began having a feeling of "indigestion" on 11/30/2018.  She felt some discomfort in her left chest and left shoulder area.  On 12/01/2018, the patient began having some shortness of breath.  She felt like she could not take a deep breath.  She felt that it may have been the tubular compression she had on her chest.  She had a little relief when she took it off, but later in the evening she began having stabbing left shoulder blade chest discomfort and muscle spasm.  She increased her dose of her Robaxin without much relief.  Because of continued left shoulder blade pain as well as chest wall discomfort and shortness of breath, the patient presented for further evaluation.  In the emergency department, the patient was afebrile hemodynamically stable, but she was hypoxic on room air saturating 84%.  She was placed on 4 L nasal cannula with oxygen saturation improving to 90%.  BMP, LFTs were unremarkable.  WBC was 17.2 with hemoglobin 11.6 and platelet 252,000.  D-dimer was 1.96.  CT angiogram of the chest showed multifocal subsegmental emboli in all the lobes of her lungs.  There was bibasilar atelectasis with a small left pleural effusion.  The patient was started on IV heparin.  Assessment/Plan: Acute respiratory failure with hypoxia -Secondary to pulmonary embolus -Stable on 3.5 L nasal cannula -Wean oxygen as tolerated for saturation greater 92% -start incentive spirometry  Acute pulmonary emboli -12/03/2018 CT angiogram chest--as described above -CT also noted a lingular pulmonary infarct -3/27 Echo-poor visualization but grossly normal size & function RV; EF 60-65% -Continue IV  heparin  Leukocytosis -Likely stress demargination -Patient is afebrile hemodynamically stable -Monitor clinically off antibiotics -Urinalysis  Invasive ductal carcinoma of the breast -Patient has follow-up with Dr. [name] -She also has follow-up with Dr. Iran Planas on 12/09/2018 -Holding tamoxifen for now postoperatively -Continue Robaxin and oxycodone for pain      Disposition Plan:   Home in 1-2 days  Family Communication:  Significant other updated 3/28  Consultants:  none  Code Status:  FULL   DVT Prophylaxis:  IV Heparin   Procedures: As Listed in Progress Note Above  Antibiotics: None       Subjective: Pt complains of moderated pleuritic CP.  She is breathing better, but still has dyspnea with mild exertion to BR.  Denies f/c, hemoptysis, n/v/d, abd pain, headache  Objective: Vitals:   12/03/18 1957 12/03/18 2033 12/04/18 0608 12/04/18 0852  BP:  (!) 154/97 (!) 162/93   Pulse:  98 93   Resp:  20 20   Temp:  98.3 F (36.8 C) 97.8 F (36.6 C) (!) 97.5 F (36.4 C)  TempSrc:  Oral Oral Axillary  SpO2: (!) 87% 93% 94%   Weight:      Height:        Intake/Output Summary (Last 24 hours) at 12/04/2018 1047 Last data filed at 12/04/2018 0651 Gross per 24 hour  Intake 1218.08 ml  Output 30 ml  Net 1188.08 ml   Weight change: 0 kg Exam:   General:  Pt is alert, follows commands appropriately, not in acute distress  HEENT: No icterus, No thrush, No neck mass, [place]/AT  Cardiovascular: RRR, S1/S2, no rubs, no gallops  Respiratory: bibasilar rales, L>R, no wheeze  Abdomen: Soft/+BS, non tender, non distended, no guarding  Extremities: trace LE edema, No lymphangitis, No petechiae, No rashes, no synovitis   Data Reviewed: I have personally reviewed following labs and imaging studies Basic Metabolic Panel: Recent Labs  Lab 12/03/18 0556 12/04/18 0819  NA 139 138  K 3.7 3.8  CL 100 100  CO2 29 29  GLUCOSE 114* 96  BUN 13 13   CREATININE 0.64 0.57  CALCIUM 9.6 8.8*   Liver Function Tests: Recent Labs  Lab 12/03/18 0556  AST 20  ALT 23  ALKPHOS 59  BILITOT 0.5  PROT 8.2*  ALBUMIN 4.1   No results for input(s): LIPASE, AMYLASE in the last 168 hours. No results for input(s): AMMONIA in the last 168 hours. Coagulation Profile: Recent Labs  Lab 12/03/18 0556  INR 1.1   CBC: Recent Labs  Lab 12/03/18 0556 12/04/18 0819  WBC 17.2* 15.0*  NEUTROABS 14.4*  --   HGB 11.6* 10.3*  HCT 36.0 32.7*  MCV 90.7 92.4  PLT 352 327   Cardiac Enzymes: Recent Labs  Lab 12/03/18 0556  TROPONINI <0.03   BNP: Invalid input(s): POCBNP CBG: No results for input(s): GLUCAP in the last 168 hours. HbA1C: No results for input(s): HGBA1C in the last 72 hours. Urine analysis:    Component Value Date/Time   COLORURINE YELLOW 07/29/2013 1501   APPEARANCEUR CLEAR 07/29/2013 1501   LABSPEC 1.005 07/29/2013 1501   PHURINE 6.5 07/29/2013 1501   GLUCOSEU NEG 07/29/2013 1501   HGBUR NEG 07/29/2013 1501   BILIRUBINUR n 07/30/2016 0840   KETONESUR NEG 07/29/2013 1501   PROTEINUR n 07/30/2016 0840   PROTEINUR NEG 07/29/2013 1501   UROBILINOGEN negative 07/30/2016 0840   UROBILINOGEN 0.2 07/29/2013 1501   NITRITE n 07/30/2016 0840   NITRITE NEG 07/29/2013 1501   LEUKOCYTESUR Negative 07/30/2016 0840   Sepsis Labs: '@LABRCNTIP'$ (procalcitonin:4,lacticidven:4) )No results found for this or any previous visit (from the past 240 hour(s)).   Scheduled Meds:  multivitamin with minerals  1 tablet Oral Daily   Continuous Infusions:  heparin 1,600 Units/hr (12/04/18 0019)    Procedures/Studies: Ct Angio Chest Pe W And/or Wo Contrast  Result Date: 12/03/2018 CLINICAL DATA:  Shortness of breath. Right-sided lumpectomy and left mastectomy 11/22/2018. EXAM: CT ANGIOGRAPHY CHEST WITH CONTRAST TECHNIQUE: Multidetector CT imaging of the chest was performed using the standard protocol during bolus administration of  intravenous contrast. Multiplanar CT image reconstructions and MIPs were obtained to evaluate the vascular anatomy. CONTRAST:  18m OMNIPAQUE IOHEXOL 350 MG/ML SOLN COMPARISON:  None. FINDINGS: Cardiovascular: Multifocal central branching pulmonary artery filling defects consistent with acute emboli, labeled on thin axial series. All lobes are affected by subsegmental clot. Segmental clot is seen within the left lower lobe. RV to LV ratio is between 0.8 and 0.9. Borderline heart size. No pericardial effusion. Negative aorta. Mediastinum/Nodes: Negative for adenopathy Lungs/Pleura: Streaky opacities in the lower lungs consistent with atelectasis. There is also ground-glass opacity in the lingula more compatible with infarct. No pulmonary edema. Small left pleural effusion. Upper Abdomen: Negative Musculoskeletal: No acute or aggressive finding. Other: Changes of right lumpectomy and left-sided mastectomy with tissue expander. Critical Value/emergent results were called by telephone at the time of interpretation on 12/03/2018 at 6:59 am to Dr. DVeryl Speak, who verbally acknowledged these results. Review of the MIP images confirms the above findings. IMPRESSION: 1. Multifocal acute segmental and subsegmental pulmonary  emboli. Negative for right heart strain. 2. Atelectasis and lingular infarct. 3. Small left pleural effusion. Electronically Signed   By: Monte Fantasia M.D.   On: 12/03/2018 07:00   Nm Sentinel Node Inj-no Rpt (breast)  Result Date: 11/22/2018 Sulfur colloid was injected by the nuclear medicine technologist for melanoma sentinel node.   Mm Breast Surgical Specimen  Result Date: 11/22/2018 CLINICAL DATA:  Evaluate surgical specimen following surgical excision of RIGHT breast ADH and lobular neoplasia. EXAM: SPECIMEN RADIOGRAPH OF THE RIGHT BREAST COMPARISON:  Previous exam(s). FINDINGS: Specimen images were visualized in epic EMR. Status post excision of the RIGHT breast. The radioactive seed  and biopsy marker clip are present, completely intact, and were marked for pathology. IMPRESSION: Specimen radiograph of the RIGHT breast. Electronically Signed   By: Margarette Canada M.D.   On: 11/22/2018 08:44   Mm Rt Radioactive Seed Loc Mammo Guide  Result Date: 11/19/2018 CLINICAL DATA:  Radioactive seed localization of the right breast prior to lumpectomy. EXAM: MAMMOGRAPHIC GUIDED RADIOACTIVE SEED LOCALIZATION OF THE RIGHT BREAST COMPARISON:  Previous exam(s). FINDINGS: Patient presents for radioactive seed localization prior to right breast lumpectomy. I met with the patient and we discussed the procedure of seed localization including benefits and alternatives. We discussed the high likelihood of a successful procedure. We discussed the risks of the procedure including infection, bleeding, tissue injury and further surgery. We discussed the low dose of radioactivity involved in the procedure. Informed, written consent was given. The usual time-out protocol was performed immediately prior to the procedure. Using mammographic guidance, sterile technique, 1% lidocaine and an I-125 radioactive seed, the biopsy marking clip in the upper inner right breast was localized using a medial approach. The follow-up mammogram images confirm the seed in the expected location and were marked for Dr. Donne Hazel. Follow-up survey of the patient confirms presence of the radioactive seed. Order number of I-125 seed:  [id number]. Total activity:  7.955 millicuries reference Date: 11/09/2018 The patient tolerated the procedure well and was released from the [facility]. She was given instructions regarding seed removal. IMPRESSION: Radioactive seed localization right breast. No apparent complications. Electronically Signed   By: Ammie Ferrier M.D.   On: 11/19/2018 14:38    Orson Eva, DO  [facility] Pager [phone]  If 7PM-7AM, please contact night-coverage [email or web address] Password [email or web address] 12/04/2018, 10:47  AM   LOS: 1 day

## 2018-12-05 ENCOUNTER — Inpatient Hospital Stay (HOSPITAL_COMMUNITY): Payer: No Typology Code available for payment source

## 2018-12-05 LAB — MAGNESIUM: Magnesium: 2.2 mg/dL (ref 1.7–2.4)

## 2018-12-05 LAB — BASIC METABOLIC PANEL
Anion gap: 9 (ref 5–15)
BUN: 13 mg/dL (ref 6–20)
CO2: 30 mmol/L (ref 22–32)
Calcium: 9 mg/dL (ref 8.9–10.3)
Chloride: 99 mmol/L (ref 98–111)
Creatinine, Ser: 0.63 mg/dL (ref 0.44–1.00)
GFR calc Af Amer: >60 mL/min (ref >60)
GFR calc non Af Amer: >60 mL/min (ref >60)
Glucose, Bld: 108 mg/dL — ABNORMAL HIGH (ref 70–99)
Potassium: 4 mmol/L (ref 3.5–5.1)
SODIUM: 138 mmol/L (ref 135–145)

## 2018-12-05 LAB — HEPARIN LEVEL (UNFRACTIONATED): Heparin Unfractionated: 0.41 IU/mL (ref 0.30–0.70)

## 2018-12-05 LAB — BRAIN NATRIURETIC PEPTIDE: B Natriuretic Peptide: 25 pg/mL (ref 0.0–100.0)

## 2018-12-05 LAB — CBC
HCT: 32.7 % — ABNORMAL LOW (ref 36.0–46.0)
Hemoglobin: 10.1 g/dL — ABNORMAL LOW (ref 12.0–15.0)
MCH: 28.5 pg (ref 26.0–34.0)
MCHC: 30.9 g/dL (ref 30.0–36.0)
MCV: 92.4 fL (ref 80.0–100.0)
NRBC: 0 % (ref 0.0–0.2)
Platelets: 347 10*3/uL (ref 150–400)
RBC: 3.54 MIL/uL — ABNORMAL LOW (ref 3.87–5.11)
RDW: 12 % (ref 11.5–15.5)
WBC: 13.6 10*3/uL — AB (ref 4.0–10.5)

## 2018-12-05 LAB — PHOSPHORUS: Phosphorus: 3.6 mg/dL (ref 2.5–4.6)

## 2018-12-05 LAB — TROPONIN I
Troponin I: <0.03 ng/mL (ref <0.03)
Troponin I: <0.03 ng/mL (ref <0.03)

## 2018-12-05 LAB — PROCALCITONIN: Procalcitonin: <0.1 ng/mL

## 2018-12-05 NOTE — Progress Notes (Signed)
PROGRESS NOTE  Jennifer Harrison IRC:[id number] DOB: 05-04-1966 DOA: 12/03/2018 PCP: Patient, [name]  Brief History:  53 y.o.femalewithno documented chronic medical problems presented with 2 to 3-day history of left shoulder discomfort, chest discomfort and shortness of breath. The patient began having a feeling of "indigestion" on 11/30/2018. She felt some discomfort in her left chest and left shoulder area. On 12/01/2018, the patient began having some shortness of breath. She felt like she could not take a deep breath. She felt that it may have been the tubular compression she had on her chest. She had a little relief when she took it off, but later in the evening she began having stabbing left shoulder blade chest discomfort and muscle spasm. She increased her dose of her Robaxin without much relief. Because of continued left shoulder blade pain as well as chest wall discomfort and shortness of breath, the patient presented for further evaluation. In the emergency department, the patient was afebrile hemodynamically stable, but she was hypoxic on room air saturating 84%. She was placed on 4 L nasal cannula with oxygen saturation improving to 90%. BMP, LFTs were unremarkable. WBC was 17.2 with hemoglobin 11.6 and platelet 252,000. D-dimer was 1.96. CT angiogram of the chest showed multifocal subsegmental emboli in all the lobes of her lungs. There was bibasilar atelectasis with a small left pleural effusion. The patient was started on IV heparin.  Assessment/Plan: Acute respiratory failure with hypoxia -Secondary to pulmonary embolus -Stable on 3.5 L nasal cannula -Wean oxygen as tolerated for saturation greater 92% -started incentive spirometry-->limited by pain -will need home oxygen--desaturated to 70% on RA -pt has very little reserve--tachycardic, dyspnea and hypoxia with minimal exertion -repeat EKG -check CXR  Acute pulmonary emboli -12/03/2018 CT  angiogram chest--as described above -CT also noted a lingular pulmonary infarct -3/27 Echo-poor visualization but grossly normal size & function RV; EF 60-65% -Continue IV heparin  Leukocytosis -Likely stress demargination -Patient is afebrile hemodynamically stable -Monitor clinically off antibiotics -trending down  Invasive ductal carcinoma of the breast -Patient has follow-up with Dr. [name] -She also has follow-up with Dr. Iran Planas on 12/09/2018 -Holding tamoxifen for now postoperatively -Continue Robaxin and oxycodone for pain      Disposition Plan:   Home in 1-2 days  Family Communication:  Significant other updated 3/29  Consultants:  none  Code Status:  FULL   DVT Prophylaxis:  IV Heparin   Procedures: As Listed in Progress Note Above  Antibiotics: None     Subjective: Pt is breathing better rest, but has significant dyspnea with minimal exertion.  Denies cp, hemoptysis, n/v/d, abd pain.  Continues to complain of left sided pleuritic chest pain and cp with movement.  Denies headache, dizziness  Objective: Vitals:   12/04/18 1337 12/04/18 1423 12/04/18 2243 12/05/18 0602  BP: 135/85  (!) 149/97 (!) 141/93  Pulse: 93  91 82  Resp: '18  18 18  '$ Temp: (!) 97.5 F (36.4 C)  98.4 F (36.9 C) 98.3 F (36.8 C)  TempSrc: Oral  Oral Oral  SpO2: 94% 92% 93% 97%  Weight:      Height:        Intake/Output Summary (Last 24 hours) at 12/05/2018 0857 Last data filed at 12/04/2018 1850 Gross per 24 hour  Intake 480 ml  Output -  Net 480 ml   Weight change:  Exam:   General:  Pt is alert, follows commands appropriately, not in acute  distress  HEENT: No icterus, No thrush, No neck mass, [place]/AT  Cardiovascular: RRR, S1/S2, no rubs, no gallops  Respiratory: bibasilar rales.  Diminished breath sounds at bases L>R  Abdomen: Soft/+BS, non tender, non distended, no guarding  Extremities: trace LE edema, No lymphangitis, No petechiae, No rashes,  no synovitis   Data Reviewed: I have personally reviewed following labs and imaging studies Basic Metabolic Panel: Recent Labs  Lab 12/03/18 0556 12/04/18 0819  NA 139 138  K 3.7 3.8  CL 100 100  CO2 29 29  GLUCOSE 114* 96  BUN 13 13  CREATININE 0.64 0.57  CALCIUM 9.6 8.8*   Liver Function Tests: Recent Labs  Lab 12/03/18 0556  AST 20  ALT 23  ALKPHOS 59  BILITOT 0.5  PROT 8.2*  ALBUMIN 4.1   No results for input(s): LIPASE, AMYLASE in the last 168 hours. No results for input(s): AMMONIA in the last 168 hours. Coagulation Profile: Recent Labs  Lab 12/03/18 0556  INR 1.1   CBC: Recent Labs  Lab 12/03/18 0556 12/04/18 0819 12/05/18 0518  WBC 17.2* 15.0* 13.6*  NEUTROABS 14.4*  --   --   HGB 11.6* 10.3* 10.1*  HCT 36.0 32.7* 32.7*  MCV 90.7 92.4 92.4  PLT 352 327 347   Cardiac Enzymes: Recent Labs  Lab 12/03/18 0556  TROPONINI <0.03   BNP: Invalid input(s): POCBNP CBG: No results for input(s): GLUCAP in the last 168 hours. HbA1C: No results for input(s): HGBA1C in the last 72 hours. Urine analysis:    Component Value Date/Time   COLORURINE YELLOW 07/29/2013 1501   APPEARANCEUR CLEAR 07/29/2013 1501   LABSPEC 1.005 07/29/2013 1501   PHURINE 6.5 07/29/2013 1501   GLUCOSEU NEG 07/29/2013 1501   HGBUR NEG 07/29/2013 1501   BILIRUBINUR n 07/30/2016 0840   KETONESUR NEG 07/29/2013 1501   PROTEINUR n 07/30/2016 0840   PROTEINUR NEG 07/29/2013 1501   UROBILINOGEN negative 07/30/2016 0840   UROBILINOGEN 0.2 07/29/2013 1501   NITRITE n 07/30/2016 0840   NITRITE NEG 07/29/2013 1501   LEUKOCYTESUR Negative 07/30/2016 0840   Sepsis Labs: '@LABRCNTIP'$ (procalcitonin:4,lacticidven:4) )No results found for this or any previous visit (from the past 240 hour(s)).   Scheduled Meds: . multivitamin with minerals  1 tablet Oral Daily   Continuous Infusions: . heparin 1,600 Units/hr (12/05/18 0408)    Procedures/Studies: Ct Angio Chest Pe W And/or Wo  Contrast  Result Date: 12/03/2018 CLINICAL DATA:  Shortness of breath. Right-sided lumpectomy and left mastectomy 11/22/2018. EXAM: CT ANGIOGRAPHY CHEST WITH CONTRAST TECHNIQUE: Multidetector CT imaging of the chest was performed using the standard protocol during bolus administration of intravenous contrast. Multiplanar CT image reconstructions and MIPs were obtained to evaluate the vascular anatomy. CONTRAST:  137m OMNIPAQUE IOHEXOL 350 MG/ML SOLN COMPARISON:  None. FINDINGS: Cardiovascular: Multifocal central branching pulmonary artery filling defects consistent with acute emboli, labeled on thin axial series. All lobes are affected by subsegmental clot. Segmental clot is seen within the left lower lobe. RV to LV ratio is between 0.8 and 0.9. Borderline heart size. No pericardial effusion. Negative aorta. Mediastinum/Nodes: Negative for adenopathy Lungs/Pleura: Streaky opacities in the lower lungs consistent with atelectasis. There is also ground-glass opacity in the lingula more compatible with infarct. No pulmonary edema. Small left pleural effusion. Upper Abdomen: Negative Musculoskeletal: No acute or aggressive finding. Other: Changes of right lumpectomy and left-sided mastectomy with tissue expander. Critical Value/emergent results were called by telephone at the time of interpretation on 12/03/2018 at 6:59 am to  Dr. Veryl Speak , who verbally acknowledged these results. Review of the MIP images confirms the above findings. IMPRESSION: 1. Multifocal acute segmental and subsegmental pulmonary emboli. Negative for right heart strain. 2. Atelectasis and lingular infarct. 3. Small left pleural effusion. Electronically Signed   By: Monte Fantasia M.D.   On: 12/03/2018 07:00   Nm Sentinel Node Inj-no Rpt (breast)  Result Date: 11/22/2018 Sulfur colloid was injected by the nuclear medicine technologist for melanoma sentinel node.   Mm Breast Surgical Specimen  Result Date: 11/22/2018 CLINICAL DATA:   Evaluate surgical specimen following surgical excision of RIGHT breast ADH and lobular neoplasia. EXAM: SPECIMEN RADIOGRAPH OF THE RIGHT BREAST COMPARISON:  Previous exam(s). FINDINGS: Specimen images were visualized in epic EMR. Status post excision of the RIGHT breast. The radioactive seed and biopsy marker clip are present, completely intact, and were marked for pathology. IMPRESSION: Specimen radiograph of the RIGHT breast. Electronically Signed   By: Margarette Canada M.D.   On: 11/22/2018 08:44   Mm Rt Radioactive Seed Loc Mammo Guide  Result Date: 11/19/2018 CLINICAL DATA:  Radioactive seed localization of the right breast prior to lumpectomy. EXAM: MAMMOGRAPHIC GUIDED RADIOACTIVE SEED LOCALIZATION OF THE RIGHT BREAST COMPARISON:  Previous exam(s). FINDINGS: Patient presents for radioactive seed localization prior to right breast lumpectomy. I met with the patient and we discussed the procedure of seed localization including benefits and alternatives. We discussed the high likelihood of a successful procedure. We discussed the risks of the procedure including infection, bleeding, tissue injury and further surgery. We discussed the low dose of radioactivity involved in the procedure. Informed, written consent was given. The usual time-out protocol was performed immediately prior to the procedure. Using mammographic guidance, sterile technique, 1% lidocaine and an I-125 radioactive seed, the biopsy marking clip in the upper inner right breast was localized using a medial approach. The follow-up mammogram images confirm the seed in the expected location and were marked for Dr. Donne Hazel. Follow-up survey of the patient confirms presence of the radioactive seed. Order number of I-125 seed:  [id number]. Total activity:  2.330 millicuries reference Date: 11/09/2018 The patient tolerated the procedure well and was released from the [facility]. She was given instructions regarding seed removal. IMPRESSION:  Radioactive seed localization right breast. No apparent complications. Electronically Signed   By: Ammie Ferrier M.D.   On: 11/19/2018 14:38    Orson Eva, DO  [facility] Pager [phone]  If 7PM-7AM, please contact night-coverage [email or web address] Password [email or web address] 12/05/2018, 8:57 AM   LOS: 2 days

## 2018-12-05 NOTE — Progress Notes (Signed)
[place] for heparin Indication: pulmonary embolus  Allergies  Allergen Reactions  . Sulfa Antibiotics Other (See Comments)    Pt unsure  . Penicillins Rash    Did it involve swelling of the face/tongue/throat, SOB, or low BP? No Did it involve sudden or severe rash/hives, skin peeling, or any reaction on the inside of your mouth or nose? Yes Did you need to seek medical attention at a hospital or doctor's office? No When did it last happen?unknown If all above answers are "NO", may proceed with cephalosporin use.     Patient Measurements: Height: 5\' 3"  (160 cm) Weight: 227 lb 1.2 oz (103 kg) IBW/kg (Calculated) : 52.4 HEPARIN DW (KG): 76.7   Vital Signs: Temp: 98.3 F (36.8 C) (03/29 0602) Temp Source: Oral (03/29 0602) BP: 141/93 (03/29 0602) Pulse Rate: 82 (03/29 0602)  Labs: Recent Labs    12/03/18 0556  12/03/18 2324 12/04/18 0819 12/05/18 0517 12/05/18 0518  HGB 11.6*  --   --  10.3*  --  10.1*  HCT 36.0  --   --  32.7*  --  32.7*  PLT 352  --   --  327  --  347  APTT 33  --   --   --   --   --   LABPROT 14.1  --   --   --   --   --   INR 1.1  --   --   --   --   --   HEPARINUNFRC  --    < > 0.12* 0.50 0.41  --   CREATININE 0.64  --   --  0.57  --   --   TROPONINI <0.03  --   --   --   --   --    < > = values in this interval not displayed.   Estimated Creatinine Clearance: 96.4 mL/min (by C-G formula based on SCr of 0.57 mg/dL).  Assessment: 53 y.o. female with PE for heparin  Goal of Therapy:  Heparin level 0.3-0.7   Plan:  HL of 0.41 IU/mL is within therapeutic goal range this morning.  Continue  Heparin at 1600 units/hr Labs as appropriate  Despina Pole, [facility] 12/05/2018,7:44 AM

## 2018-12-05 NOTE — Progress Notes (Signed)
Patient ambulated 200 ft with 4 L of O2. Tolerated well and sats 95%.

## 2018-12-06 ENCOUNTER — Telehealth: Payer: Self-pay | Admitting: Certified Nurse Midwife

## 2018-12-06 LAB — CBC
HEMATOCRIT: 33.5 % — AB (ref 36.0–46.0)
Hemoglobin: 10.6 g/dL — ABNORMAL LOW (ref 12.0–15.0)
MCH: 28.9 pg (ref 26.0–34.0)
MCHC: 31.6 g/dL (ref 30.0–36.0)
MCV: 91.3 fL (ref 80.0–100.0)
Platelets: 371 10*3/uL (ref 150–400)
RBC: 3.67 MIL/uL — ABNORMAL LOW (ref 3.87–5.11)
RDW: 12 % (ref 11.5–15.5)
WBC: 12.3 10*3/uL — ABNORMAL HIGH (ref 4.0–10.5)
nRBC: 0 % (ref 0.0–0.2)

## 2018-12-06 LAB — HEPARIN LEVEL (UNFRACTIONATED): Heparin Unfractionated: 0.48 IU/mL (ref 0.30–0.70)

## 2018-12-06 MED ORDER — APIXABAN 5 MG PO TABS
10.0000 mg | ORAL_TABLET | Freq: Two times a day (BID) | ORAL | Status: DC
  Administered 2018-12-06 – 2018-12-07 (×3): 10 mg via ORAL
  Filled 2018-12-06 (×3): qty 2

## 2018-12-06 MED ORDER — APIXABAN 5 MG PO TABS: 5.0000 mg | ORAL_TABLET | Freq: Two times a day (BID) | ORAL | Status: DC

## 2018-12-06 NOTE — Clinical Social Work Note (Signed)
Jennifer Harrison (phone: [phone]; fax [phone]) at patient's oncologist's office returned contact and stated that patient's oncologist, Dr. Lurline Del, NPI [id number], will follow patient for her oxygen needs.   Patient provided list of DME providers and did not have a preference.   Jennifer Harrison at [facility] provided referral for DME and acknowledged receipt.  Attending notified.  Jennifer Harrison, [name], LCSW

## 2018-12-06 NOTE — Telephone Encounter (Signed)
Heather at [employer] [facility] [employer] is calling asking if Jennifer Harrison would follow this patient for her "oxygen therapy"? Nira Conn said this patient does not have a PCP. I told Nira Conn I was not sure if this request would be possible due this office is strictly GYN. Nira Conn would like to confirm this with Jennifer Harrison.

## 2018-12-06 NOTE — Telephone Encounter (Signed)
Call back to [facility]. Advised we cannot order oxygen therapy but referred to oncologist.  Jennifer Harrison has not been able to reach Dr Magrinat's office. Given numbers to breast health navigator who maybe able to facilitate needed orders.   Routing to Dr Talbert Nan ( Debbi Hollice Espy CNM out of office). Encounter closed.

## 2018-12-06 NOTE — Progress Notes (Signed)
Jennifer Harrison for heparin Indication: pulmonary embolus  Allergies  Allergen Reactions  . Sulfa Antibiotics Other (See Comments)    Pt unsure  . Penicillins Rash    Did it involve swelling of the face/tongue/throat, SOB, or low BP? No Did it involve sudden or severe rash/hives, skin peeling, or any reaction on the inside of your mouth or nose? Yes Did you need to seek medical attention at a hospital or doctor's office? No When did it last happen?unknown If all above answers are "NO", may proceed with cephalosporin use.     Patient Measurements: Height: 5\' 3"  (160 cm) Weight: 227 lb 1.2 oz (103 kg) IBW/kg (Calculated) : 52.4 HEPARIN DW (KG): 76.7   Vital Signs: Temp: 98.1 F (36.7 C) (03/30 0534) Temp Source: Oral (03/30 0534) BP: 137/90 (03/30 0534) Pulse Rate: 86 (03/30 0534)  Labs: Recent Labs    12/04/18 0819 12/05/18 0517 12/05/18 0518 12/05/18 1004 12/05/18 1555 12/06/18 0424  HGB 10.3*  --  10.1*  --   --  10.6*  HCT 32.7*  --  32.7*  --   --  33.5*  PLT 327  --  347  --   --  371  HEPARINUNFRC 0.50 0.41  --   --   --  0.48  CREATININE 0.57  --   --  0.63  --   --   TROPONINI  --   --   --  <0.03 <0.03  --    Estimated Creatinine Clearance: 96.4 mL/min (by C-G formula based on SCr of 0.63 mg/dL).  Assessment: 53 y.o. female with PE for heparin. Heparin level remains therapeutic . F/U plan  Goal of Therapy:  Heparin level 0.3-0.7   Plan:  Continue Heparin infusion at 1600 units/hr Check anti-Xa level daily while on heparin Continue to monitor H&H and platelets  Isac Sarna, BS Vena [place], BCPS Clinical Pharmacist Pager [phone] 12/06/2018,10:30 AM

## 2018-12-06 NOTE — Progress Notes (Signed)
SATURATION QUALIFICATIONS: (This note is used to comply with regulatory documentation for home oxygen)  Patient Saturations on Room Air at Rest = 85%  Patient Saturations on Room Air while Ambulating = did not perform--saturation 85% at rest  Patient Saturations on 3.5 Liters of oxygen while Ambulating = 93%  Please briefly explain why patient needs home oxygen: To maintain 02 sat at 90% or above during ambulation.  DTat

## 2018-12-06 NOTE — Progress Notes (Signed)
PROGRESS NOTE  Jennifer Harrison:[id number] DOB: 1965-10-18 DOA: 12/03/2018 PCP: Patient, [name]  Brief History: 53 y.o.femalewithno documented chronic medical problems presented with 2 to 3-day history of left shoulder discomfort, chest discomfort and shortness of breath. The patient began having a feeling of "indigestion" on 11/30/2018. She felt some discomfort in her left chest and left shoulder area. On 12/01/2018, the patient began having some shortness of breath. She felt like she could not take a deep breath. She felt that it may have been the tubular compression she had on her chest. She had a little relief when she took it off, but later in the evening she began having stabbing left shoulder blade chest discomfort and muscle spasm. She increased her dose of her Robaxin without much relief. Because of continued left shoulder blade pain as well as chest wall discomfort and shortness of breath, the patient presented for further evaluation. In the emergency department, the patient was afebrile hemodynamically stable, but she was hypoxic on room air saturating 84%. She was placed on 4 L nasal cannula with oxygen saturation improving to 90%. BMP, LFTs were unremarkable. WBC was 17.2 with hemoglobin 11.6 and platelet 252,000. D-dimer was 1.96. CT angiogram of the chest showed multifocal subsegmental emboli in all the lobes of her lungs. There was bibasilar atelectasis with a small left pleural effusion. The patient was started on IV heparin.  Assessment/Plan: Acute respiratory failure with hypoxia -Secondary to pulmonary embolus -Stable on3.5L nasal cannula -Wean oxygen as tolerated for saturation greater 92% -started incentive spirometry-->limited by pain -will need home oxygen--desaturated to 70% on RA -pt has very little reserve--tachycardic, dyspnea and hypoxia with minimal exertion -repeat EKG--personally reviewed--sinus with nonspecific T wave  changes -check CXR--poor inspiraiton; LLL opacity; increased interstitial markings  Acute pulmonary emboli -12/03/2018 CT angiogram chest--as described above -CT also noted a lingular pulmonary infarct -3/27 Echo-poor visualization but grossly normal size & function RV; EF 60-65% -Continue IV heparin>>>transition to po apixaban  Leukocytosis -Likely stress demargination -Patient is afebrile hemodynamically stable -Monitor clinically off antibiotics -trending down  Invasive ductal carcinoma of the breast -Patient has follow-up with Dr. [name] -She also has follow-up with Dr. Iran Planas on 12/09/2018 -Holding tamoxifen for now postoperatively -Continue Robaxin and oxycodone for pain      Disposition Plan: Home 3/31 if stable Family Communication:Significant other updated 3/30  Consultants:none  Code Status: FULL   DVT Prophylaxis:IVHeparin   Procedures: As Listed in Progress Note Above  Antibiotics: None   Subjective: Pt feeling a little better today.  She continues to have moderate left sided cp and dyspnea on exertion.  Denies f/c, n/v/d, abd pain.    Objective: Vitals:   12/05/18 1414 12/05/18 2032 12/05/18 2113 12/06/18 0534  BP: (!) 139/98  (!) 145/93 137/90  Pulse: 86 78 88 86  Resp: '19 16 18 18  '$ Temp:   98 F (36.7 C) 98.1 F (36.7 C)  TempSrc:   Oral Oral  SpO2: 94% 92% 96% 95%  Weight:      Height:        Intake/Output Summary (Last 24 hours) at 12/06/2018 1117 Last data filed at 12/06/2018 0800 Gross per 24 hour  Intake 720 ml  Output 20 ml  Net 700 ml   Weight change:  Exam:   General:  Pt is alert, follows commands appropriately, not in acute distress  HEENT: No icterus, No thrush, No neck mass, [place]/AT  Cardiovascular: RRR,  S1/S2, no rubs, no gallops  Respiratory: poor inspiratory effort.  Bibasilar rales. No wheeze  Abdomen: Soft/+BS, non tender, non distended, no guarding  Extremities: trace LE edema, No  lymphangitis, No petechiae, No rashes, no synovitis   Data Reviewed: I have personally reviewed following labs and imaging studies Basic Metabolic Panel: Recent Labs  Lab 12/03/18 0556 12/04/18 0819 12/05/18 1004  NA 139 138 138  K 3.7 3.8 4.0  CL 100 100 99  CO2 '29 29 30  '$ GLUCOSE 114* 96 108*  BUN '13 13 13  '$ CREATININE 0.64 0.57 0.63  CALCIUM 9.6 8.8* 9.0  MG  --   --  2.2  PHOS  --   --  3.6   Liver Function Tests: Recent Labs  Lab 12/03/18 0556  AST 20  ALT 23  ALKPHOS 59  BILITOT 0.5  PROT 8.2*  ALBUMIN 4.1   No results for input(s): LIPASE, AMYLASE in the last 168 hours. No results for input(s): AMMONIA in the last 168 hours. Coagulation Profile: Recent Labs  Lab 12/03/18 0556  INR 1.1   CBC: Recent Labs  Lab 12/03/18 0556 12/04/18 0819 12/05/18 0518 12/06/18 0424  WBC 17.2* 15.0* 13.6* 12.3*  NEUTROABS 14.4*  --   --   --   HGB 11.6* 10.3* 10.1* 10.6*  HCT 36.0 32.7* 32.7* 33.5*  MCV 90.7 92.4 92.4 91.3  PLT 352 327 347 371   Cardiac Enzymes: Recent Labs  Lab 12/03/18 0556 12/05/18 1004 12/05/18 1555  TROPONINI <0.03 <0.03 <0.03   BNP: Invalid input(s): POCBNP CBG: No results for input(s): GLUCAP in the last 168 hours. HbA1C: No results for input(s): HGBA1C in the last 72 hours. Urine analysis:    Component Value Date/Time   COLORURINE YELLOW 07/29/2013 1501   APPEARANCEUR CLEAR 07/29/2013 1501   LABSPEC 1.005 07/29/2013 1501   PHURINE 6.5 07/29/2013 1501   GLUCOSEU NEG 07/29/2013 1501   HGBUR NEG 07/29/2013 1501   BILIRUBINUR n 07/30/2016 0840   KETONESUR NEG 07/29/2013 1501   PROTEINUR n 07/30/2016 0840   PROTEINUR NEG 07/29/2013 1501   UROBILINOGEN negative 07/30/2016 0840   UROBILINOGEN 0.2 07/29/2013 1501   NITRITE n 07/30/2016 0840   NITRITE NEG 07/29/2013 1501   LEUKOCYTESUR Negative 07/30/2016 0840   Sepsis Labs: '@LABRCNTIP'$ (procalcitonin:4,lacticidven:4) )No results found for this or any previous visit (from the  past 240 hour(s)).   Scheduled Meds:  multivitamin with minerals  1 tablet Oral Daily   Continuous Infusions:  heparin 1,600 Units/hr (12/05/18 2052)    Procedures/Studies: Ct Angio Chest Pe W And/or Wo Contrast  Result Date: 12/03/2018 CLINICAL DATA:  Shortness of breath. Right-sided lumpectomy and left mastectomy 11/22/2018. EXAM: CT ANGIOGRAPHY CHEST WITH CONTRAST TECHNIQUE: Multidetector CT imaging of the chest was performed using the standard protocol during bolus administration of intravenous contrast. Multiplanar CT image reconstructions and MIPs were obtained to evaluate the vascular anatomy. CONTRAST:  177m OMNIPAQUE IOHEXOL 350 MG/ML SOLN COMPARISON:  None. FINDINGS: Cardiovascular: Multifocal central branching pulmonary artery filling defects consistent with acute emboli, labeled on thin axial series. All lobes are affected by subsegmental clot. Segmental clot is seen within the left lower lobe. RV to LV ratio is between 0.8 and 0.9. Borderline heart size. No pericardial effusion. Negative aorta. Mediastinum/Nodes: Negative for adenopathy Lungs/Pleura: Streaky opacities in the lower lungs consistent with atelectasis. There is also ground-glass opacity in the lingula more compatible with infarct. No pulmonary edema. Small left pleural effusion. Upper Abdomen: Negative Musculoskeletal: No acute or aggressive finding.  Other: Changes of right lumpectomy and left-sided mastectomy with tissue expander. Critical Value/emergent results were called by telephone at the time of interpretation on 12/03/2018 at 6:59 am to Dr. Veryl Speak , who verbally acknowledged these results. Review of the MIP images confirms the above findings. IMPRESSION: 1. Multifocal acute segmental and subsegmental pulmonary emboli. Negative for right heart strain. 2. Atelectasis and lingular infarct. 3. Small left pleural effusion. Electronically Signed   By: Monte Fantasia M.D.   On: 12/03/2018 07:00   Nm Sentinel Node  Inj-no Rpt (breast)  Result Date: 11/22/2018 Sulfur colloid was injected by the nuclear medicine technologist for melanoma sentinel node.   Mm Breast Surgical Specimen  Result Date: 11/22/2018 CLINICAL DATA:  Evaluate surgical specimen following surgical excision of RIGHT breast ADH and lobular neoplasia. EXAM: SPECIMEN RADIOGRAPH OF THE RIGHT BREAST COMPARISON:  Previous exam(s). FINDINGS: Specimen images were visualized in epic EMR. Status post excision of the RIGHT breast. The radioactive seed and biopsy marker clip are present, completely intact, and were marked for pathology. IMPRESSION: Specimen radiograph of the RIGHT breast. Electronically Signed   By: Margarette Canada M.D.   On: 11/22/2018 08:44   Dg Chest Port 1 View  Result Date: 12/05/2018 CLINICAL DATA:  Shortness of breath, breast cancer, postop PE EXAM: PORTABLE CHEST 1 VIEW COMPARISON:  CTA chest dated 12/03/2018 FINDINGS: Low lung volumes. Mild left perihilar opacity, favoring mild interstitial edema. Additional lingular and bilateral lower lobe atelectasis is suspected. Small left pleural effusion. No pneumothorax. The heart is top-normal in size when accounting for inspiration. IMPRESSION: Suspected mild left perihilar edema.  Small left pleural effusion. Additional lingular and bilateral lower lobe atelectasis. Electronically Signed   By: Julian Hy M.D.   On: 12/05/2018 09:22   Mm Rt Radioactive Seed Loc Mammo Guide  Result Date: 11/19/2018 CLINICAL DATA:  Radioactive seed localization of the right breast prior to lumpectomy. EXAM: MAMMOGRAPHIC GUIDED RADIOACTIVE SEED LOCALIZATION OF THE RIGHT BREAST COMPARISON:  Previous exam(s). FINDINGS: Patient presents for radioactive seed localization prior to right breast lumpectomy. I met with the patient and we discussed the procedure of seed localization including benefits and alternatives. We discussed the high likelihood of a successful procedure. We discussed the risks of the  procedure including infection, bleeding, tissue injury and further surgery. We discussed the low dose of radioactivity involved in the procedure. Informed, written consent was given. The usual time-out protocol was performed immediately prior to the procedure. Using mammographic guidance, sterile technique, 1% lidocaine and an I-125 radioactive seed, the biopsy marking clip in the upper inner right breast was localized using a medial approach. The follow-up mammogram images confirm the seed in the expected location and were marked for Dr. Donne Hazel. Follow-up survey of the patient confirms presence of the radioactive seed. Order number of I-125 seed:  [id number]. Total activity:  9.528 millicuries reference Date: 11/09/2018 The patient tolerated the procedure well and was released from the [facility]. She was given instructions regarding seed removal. IMPRESSION: Radioactive seed localization right breast. No apparent complications. Electronically Signed   By: Ammie Ferrier M.D.   On: 11/19/2018 14:38    Orson Eva, DO  [facility] Pager [phone]  If 7PM-7AM, please contact night-coverage [email or web address] Password [email or web address] 12/06/2018, 11:17 AM   LOS: 3 days

## 2018-12-06 NOTE — Progress Notes (Signed)
[place] for heparin--> eliquis Indication: pulmonary embolus  Allergies  Allergen Reactions  . Sulfa Antibiotics Other (See Comments)    Pt unsure  . Penicillins Rash    Did it involve swelling of the face/tongue/throat, SOB, or low BP? No Did it involve sudden or severe rash/hives, skin peeling, or any reaction on the inside of your mouth or nose? Yes Did you need to seek medical attention at a hospital or doctor's office? No When did it last happen?unknown If all above answers are "NO", may proceed with cephalosporin use.     Patient Measurements: Height: 5\' 3"  (160 cm) Weight: 227 lb 1.2 oz (103 kg) IBW/kg (Calculated) : 52.4 HEPARIN DW (KG): 76.7   Vital Signs: Temp: 98.1 F (36.7 C) (03/30 0534) Temp Source: Oral (03/30 0534) BP: 137/90 (03/30 0534) Pulse Rate: 86 (03/30 0534)  Labs: Recent Labs    12/04/18 0819 12/05/18 0517 12/05/18 0518 12/05/18 1004 12/05/18 1555 12/06/18 0424  HGB 10.3*  --  10.1*  --   --  10.6*  HCT 32.7*  --  32.7*  --   --  33.5*  PLT 327  --  347  --   --  371  HEPARINUNFRC 0.50 0.41  --   --   --  0.48  CREATININE 0.57  --   --  0.63  --   --   TROPONINI  --   --   --  <0.03 <0.03  --    Estimated Creatinine Clearance: 96.4 mL/min (by C-G formula based on SCr of 0.63 mg/dL).  Assessment: 53 y.o. female with PE for heparin. Heparin level remains therapeutic . Plan is to transition to po eliquis now  Goal of Therapy:  Heparin level 0.3-0.7   Plan:  D/C heparin, in 1 hour start eliquis 10mg  po BID for 1 week, then 5mg  po bid Monitor for S/S of bleeding Educate on eliquis  Isac Sarna, BS Vena [place], BCPS Clinical Pharmacist Pager [phone] 12/06/2018,11:31 AM

## 2018-12-07 LAB — CBC
HCT: 32.8 % — ABNORMAL LOW (ref 36.0–46.0)
Hemoglobin: 10.4 g/dL — ABNORMAL LOW (ref 12.0–15.0)
MCH: 28.6 pg (ref 26.0–34.0)
MCHC: 31.7 g/dL (ref 30.0–36.0)
MCV: 90.1 fL (ref 80.0–100.0)
NRBC: 0 % (ref 0.0–0.2)
Platelets: 374 10*3/uL (ref 150–400)
RBC: 3.64 MIL/uL — ABNORMAL LOW (ref 3.87–5.11)
RDW: 12 % (ref 11.5–15.5)
WBC: 13 10*3/uL — ABNORMAL HIGH (ref 4.0–10.5)

## 2018-12-07 MED ORDER — OXYCODONE HCL 5 MG PO TABS: 5.0000 mg | ORAL_TABLET | ORAL | 0 refills | Status: DC | PRN

## 2018-12-07 MED ORDER — APIXABAN 5 MG PO TABS: 10.0000 mg | ORAL_TABLET | Freq: Two times a day (BID) | ORAL | 0 refills | Status: DC

## 2018-12-07 NOTE — Discharge Summary (Signed)
Physician Discharge Summary  Jennifer Harrison TMH:[id number] DOB: 1965/09/27 DOA: 12/03/2018  PCP: Patient, [name]  Admit date: 12/03/2018 Discharge date: 12/07/2018  Admitted From: Home Disposition:  Home  Recommendations for Outpatient Follow-up:  1. Follow up with PCP in 1-2 weeks 2. Please obtain BMP/CBC in one week    Discharge Condition: Stable CODE STATUS: FULL Diet recommendation: Regular   Brief/Interim Summary: 53 y.o.femalewithno documented chronic medical problems presented with 2 to 3-day history of left shoulder discomfort, chest discomfort and shortness of breath. The patient began having a feeling of "indigestion" on 11/30/2018. She felt some discomfort in her left chest and left shoulder area. On 12/01/2018, the patient began having some shortness of breath. She felt like she could not take a deep breath. She felt that it may have been the tubular compression she had on her chest. She had a little relief when she took it off, but later in the evening she began having stabbing left shoulder blade chest discomfort and muscle spasm. She increased her dose of her Robaxin without much relief. Because of continued left shoulder blade pain as well as chest wall discomfort and shortness of breath, the patient presented for further evaluation. In the emergency department, the patient was afebrile hemodynamically stable, but she was hypoxic on room air saturating 84%. She was placed on 4 L nasal cannula with oxygen saturation improving to 90%. BMP, LFTs were unremarkable. WBC was 17.2 with hemoglobin 11.6 and platelet 252,000. D-dimer was 1.96. CT angiogram of the chest showed multifocal subsegmental emboli in all the lobes of her lungs. There was bibasilar atelectasis with a small left pleural effusion. The patient was started on IV heparin.  The pt was slow to improve as she continued to have significant pleuritic pain resulting in hypoventilation and subsequent  hypoxia.  Fortunately, her pain gradually improved allowing her to have improved ventilation although she remained dyspneic on exertion.  Echo was reassuring and she remained hemodynamically stable.  She did desaturated with ambulation; therefore, she was set up with home oxygen.  She will follow up with Dr. [name] who will manage her breast cancer, and he agreed to also manage her oxygen.  Discharge Diagnoses:  Acute respiratory failure with hypoxia -Secondary to pulmonary embolus -Stable on3.5L nasal cannula -Wean oxygen as tolerated for saturation greater 92% -startedincentive spirometry-->limited by pain -will need home oxygen--desaturated to 80% on RA -pt has very little reserve--tachycardic, dyspnea and hypoxia with minimal exertion -repeat EKG--personally reviewed--sinus with nonspecific T wave changes -3/29 CXR--poor inspiraiton; LLL opacity; increased interstitial markings -d/c home with [place] @ 3.5L  Acute pulmonary emboli -12/03/2018 CT angiogram chest--as described above -CT also noted a lingular pulmonary infarct -3/27 Echo-poor visualization but grossly normal size & function RV; EF 60-65% -Continue IV heparin>>>transition to po apixaban 10 mg bid, then 5 mg bid starting 12/13/18  Leukocytosis -Likely stress demargination -Patient is afebrile hemodynamically stable -Monitor clinically off antibiotics-->remained stable and afebrile -trending down  Invasive ductal carcinoma of the breast -Patient has follow-up with Dr. [name] -She also has follow-up with Dr. Iran Planas on 12/09/2018 -Holding tamoxifen for now postoperatively -Continue Robaxin and oxycodone for pain      Discharge Instructions   Allergies as of 12/07/2018      Reactions   Sulfa Antibiotics Other (See Comments)   Pt unsure   Penicillins Rash   Did it involve swelling of the face/tongue/throat, SOB, or low BP? No Did it involve sudden or severe rash/hives, skin peeling, or any  reaction on the  inside of your mouth or nose? Yes Did you need to seek medical attention at a hospital or doctor's office? No When did it last happen?unknown If all above answers are NO, may proceed with cephalosporin use.      Medication List    TAKE these medications   apixaban 5 MG Tabs tablet Commonly known as:  ELIQUIS Take 2 tablets (10 mg total) by mouth 2 (two) times daily. Starting 12/13/18--take 1 tablet (5 mg) two times daily   methocarbamol 500 MG tablet Commonly known as:  ROBAXIN Take 1 tablet (500 mg total) by mouth every 8 (eight) hours as needed for muscle spasms.   multivitamin tablet Take 1 tablet by mouth daily.   oxyCODONE 5 MG immediate release tablet Commonly known as:  Oxy IR/ROXICODONE Take 1-2 tablets (5-10 mg total) by mouth every 4 (four) hours as needed for moderate pain.   tamoxifen 20 MG tablet Commonly known as:  NOLVADEX Take one tablet daily   VITAMIN D PO Take 1 capsule by mouth daily.            Durable Medical Equipment  (From admission, onward)         Start     Ordered   12/06/18 1545  For home use only DME oxygen  Once    Question Answer Comment  Mode or (Route) Nasal cannula   Liters per Minute 3.5   Frequency Continuous (stationary and portable oxygen unit needed)   Oxygen conserving device Yes   Oxygen delivery system Gas      12/06/18 1545          Allergies  Allergen Reactions   Sulfa Antibiotics Other (See Comments)    Pt unsure   Penicillins Rash    Did it involve swelling of the face/tongue/throat, SOB, or low BP? No Did it involve sudden or severe rash/hives, skin peeling, or any reaction on the inside of your mouth or nose? Yes Did you need to seek medical attention at a hospital or doctor's office? No When did it last happen?unknown If all above answers are NO, may proceed with cephalosporin use.     Consultations:  none   Procedures/Studies: Ct Angio Chest Pe W And/or Wo Contrast  Result  Date: 12/03/2018 CLINICAL DATA:  Shortness of breath. Right-sided lumpectomy and left mastectomy 11/22/2018. EXAM: CT ANGIOGRAPHY CHEST WITH CONTRAST TECHNIQUE: Multidetector CT imaging of the chest was performed using the standard protocol during bolus administration of intravenous contrast. Multiplanar CT image reconstructions and MIPs were obtained to evaluate the vascular anatomy. CONTRAST:  110m OMNIPAQUE IOHEXOL 350 MG/ML SOLN COMPARISON:  None. FINDINGS: Cardiovascular: Multifocal central branching pulmonary artery filling defects consistent with acute emboli, labeled on thin axial series. All lobes are affected by subsegmental clot. Segmental clot is seen within the left lower lobe. RV to LV ratio is between 0.8 and 0.9. Borderline heart size. No pericardial effusion. Negative aorta. Mediastinum/Nodes: Negative for adenopathy Lungs/Pleura: Streaky opacities in the lower lungs consistent with atelectasis. There is also ground-glass opacity in the lingula more compatible with infarct. No pulmonary edema. Small left pleural effusion. Upper Abdomen: Negative Musculoskeletal: No acute or aggressive finding. Other: Changes of right lumpectomy and left-sided mastectomy with tissue expander. Critical Value/emergent results were called by telephone at the time of interpretation on 12/03/2018 at 6:59 am to Dr. DVeryl Speak, who verbally acknowledged these results. Review of the MIP images confirms the above findings. IMPRESSION: 1. Multifocal acute segmental and subsegmental  pulmonary emboli. Negative for right heart strain. 2. Atelectasis and lingular infarct. 3. Small left pleural effusion. Electronically Signed   By: Monte Fantasia M.D.   On: 12/03/2018 07:00   Nm Sentinel Node Inj-no Rpt (breast)  Result Date: 11/22/2018 Sulfur colloid was injected by the nuclear medicine technologist for melanoma sentinel node.   Mm Breast Surgical Specimen  Result Date: 11/22/2018 CLINICAL DATA:  Evaluate surgical  specimen following surgical excision of RIGHT breast ADH and lobular neoplasia. EXAM: SPECIMEN RADIOGRAPH OF THE RIGHT BREAST COMPARISON:  Previous exam(s). FINDINGS: Specimen images were visualized in epic EMR. Status post excision of the RIGHT breast. The radioactive seed and biopsy marker clip are present, completely intact, and were marked for pathology. IMPRESSION: Specimen radiograph of the RIGHT breast. Electronically Signed   By: Margarette Canada M.D.   On: 11/22/2018 08:44   Dg Chest Port 1 View  Result Date: 12/05/2018 CLINICAL DATA:  Shortness of breath, breast cancer, postop PE EXAM: PORTABLE CHEST 1 VIEW COMPARISON:  CTA chest dated 12/03/2018 FINDINGS: Low lung volumes. Mild left perihilar opacity, favoring mild interstitial edema. Additional lingular and bilateral lower lobe atelectasis is suspected. Small left pleural effusion. No pneumothorax. The heart is top-normal in size when accounting for inspiration. IMPRESSION: Suspected mild left perihilar edema.  Small left pleural effusion. Additional lingular and bilateral lower lobe atelectasis. Electronically Signed   By: Julian Hy M.D.   On: 12/05/2018 09:22   Mm Rt Radioactive Seed Loc Mammo Guide  Result Date: 11/19/2018 CLINICAL DATA:  Radioactive seed localization of the right breast prior to lumpectomy. EXAM: MAMMOGRAPHIC GUIDED RADIOACTIVE SEED LOCALIZATION OF THE RIGHT BREAST COMPARISON:  Previous exam(s). FINDINGS: Patient presents for radioactive seed localization prior to right breast lumpectomy. I met with the patient and we discussed the procedure of seed localization including benefits and alternatives. We discussed the high likelihood of a successful procedure. We discussed the risks of the procedure including infection, bleeding, tissue injury and further surgery. We discussed the low dose of radioactivity involved in the procedure. Informed, written consent was given. The usual time-out protocol was performed immediately  prior to the procedure. Using mammographic guidance, sterile technique, 1% lidocaine and an I-125 radioactive seed, the biopsy marking clip in the upper inner right breast was localized using a medial approach. The follow-up mammogram images confirm the seed in the expected location and were marked for Dr. Donne Hazel. Follow-up survey of the patient confirms presence of the radioactive seed. Order number of I-125 seed:  [id number]. Total activity:  8.502 millicuries reference Date: 11/09/2018 The patient tolerated the procedure well and was released from the [facility]. She was given instructions regarding seed removal. IMPRESSION: Radioactive seed localization right breast. No apparent complications. Electronically Signed   By: Ammie Ferrier M.D.   On: 11/19/2018 14:38         Discharge Exam: Vitals:   12/06/18 2148 12/07/18 0544  BP: 130/83 (!) 148/97  Pulse: 99 96  Resp: 20 17  Temp: 98.6 F (37 C) 98.6 F (37 C)  SpO2: 97% 97%   Vitals:   12/06/18 1438 12/06/18 1926 12/06/18 2148 12/07/18 0544  BP: (!) 158/80  130/83 (!) 148/97  Pulse: (!) 102  99 96  Resp: '20  20 17  '$ Temp: 98.3 F (36.8 C)  98.6 F (37 C) 98.6 F (37 C)  TempSrc: Oral  Oral Oral  SpO2: 95% 95% 97% 97%  Weight:      Height:  General: Pt is alert, awake, not in acute distress Cardiovascular: RRR, S1/S2 +, no rubs, no gallops Respiratory: bibasilar crackles, no wheeze Abdominal: Soft, NT, ND, bowel sounds + Extremities: no edema, no cyanosis   The results of significant diagnostics from this hospitalization (including imaging, microbiology, ancillary and laboratory) are listed below for reference.    Significant Diagnostic Studies: Ct Angio Chest Pe W And/or Wo Contrast  Result Date: 12/03/2018 CLINICAL DATA:  Shortness of breath. Right-sided lumpectomy and left mastectomy 11/22/2018. EXAM: CT ANGIOGRAPHY CHEST WITH CONTRAST TECHNIQUE: Multidetector CT imaging of the chest was performed  using the standard protocol during bolus administration of intravenous contrast. Multiplanar CT image reconstructions and MIPs were obtained to evaluate the vascular anatomy. CONTRAST:  159m OMNIPAQUE IOHEXOL 350 MG/ML SOLN COMPARISON:  None. FINDINGS: Cardiovascular: Multifocal central branching pulmonary artery filling defects consistent with acute emboli, labeled on thin axial series. All lobes are affected by subsegmental clot. Segmental clot is seen within the left lower lobe. RV to LV ratio is between 0.8 and 0.9. Borderline heart size. No pericardial effusion. Negative aorta. Mediastinum/Nodes: Negative for adenopathy Lungs/Pleura: Streaky opacities in the lower lungs consistent with atelectasis. There is also ground-glass opacity in the lingula more compatible with infarct. No pulmonary edema. Small left pleural effusion. Upper Abdomen: Negative Musculoskeletal: No acute or aggressive finding. Other: Changes of right lumpectomy and left-sided mastectomy with tissue expander. Critical Value/emergent results were called by telephone at the time of interpretation on 12/03/2018 at 6:59 am to Dr. DVeryl Speak, who verbally acknowledged these results. Review of the MIP images confirms the above findings. IMPRESSION: 1. Multifocal acute segmental and subsegmental pulmonary emboli. Negative for right heart strain. 2. Atelectasis and lingular infarct. 3. Small left pleural effusion. Electronically Signed   By: JMonte FantasiaM.D.   On: 12/03/2018 07:00   Nm Sentinel Node Inj-no Rpt (breast)  Result Date: 11/22/2018 Sulfur colloid was injected by the nuclear medicine technologist for melanoma sentinel node.   Mm Breast Surgical Specimen  Result Date: 11/22/2018 CLINICAL DATA:  Evaluate surgical specimen following surgical excision of RIGHT breast ADH and lobular neoplasia. EXAM: SPECIMEN RADIOGRAPH OF THE RIGHT BREAST COMPARISON:  Previous exam(s). FINDINGS: Specimen images were visualized in epic EMR.  Status post excision of the RIGHT breast. The radioactive seed and biopsy marker clip are present, completely intact, and were marked for pathology. IMPRESSION: Specimen radiograph of the RIGHT breast. Electronically Signed   By: JMargarette CanadaM.D.   On: 11/22/2018 08:44   Dg Chest Port 1 View  Result Date: 12/05/2018 CLINICAL DATA:  Shortness of breath, breast cancer, postop PE EXAM: PORTABLE CHEST 1 VIEW COMPARISON:  CTA chest dated 12/03/2018 FINDINGS: Low lung volumes. Mild left perihilar opacity, favoring mild interstitial edema. Additional lingular and bilateral lower lobe atelectasis is suspected. Small left pleural effusion. No pneumothorax. The heart is top-normal in size when accounting for inspiration. IMPRESSION: Suspected mild left perihilar edema.  Small left pleural effusion. Additional lingular and bilateral lower lobe atelectasis. Electronically Signed   By: SJulian HyM.D.   On: 12/05/2018 09:22   Mm Rt Radioactive Seed Loc Mammo Guide  Result Date: 11/19/2018 CLINICAL DATA:  Radioactive seed localization of the right breast prior to lumpectomy. EXAM: MAMMOGRAPHIC GUIDED RADIOACTIVE SEED LOCALIZATION OF THE RIGHT BREAST COMPARISON:  Previous exam(s). FINDINGS: Patient presents for radioactive seed localization prior to right breast lumpectomy. I met with the patient and we discussed the procedure of seed localization including benefits and alternatives. We discussed  the high likelihood of a successful procedure. We discussed the risks of the procedure including infection, bleeding, tissue injury and further surgery. We discussed the low dose of radioactivity involved in the procedure. Informed, written consent was given. The usual time-out protocol was performed immediately prior to the procedure. Using mammographic guidance, sterile technique, 1% lidocaine and an I-125 radioactive seed, the biopsy marking clip in the upper inner right breast was localized using a medial approach. The  follow-up mammogram images confirm the seed in the expected location and were marked for Dr. Donne Hazel. Follow-up survey of the patient confirms presence of the radioactive seed. Order number of I-125 seed:  [id number]. Total activity:  5.397 millicuries reference Date: 11/09/2018 The patient tolerated the procedure well and was released from the [facility]. She was given instructions regarding seed removal. IMPRESSION: Radioactive seed localization right breast. No apparent complications. Electronically Signed   By: Ammie Ferrier M.D.   On: 11/19/2018 14:38     Microbiology: No results found for this or any previous visit (from the past 240 hour(s)).   Labs: Basic Metabolic Panel: Recent Labs  Lab 12/03/18 0556 12/04/18 0819 12/05/18 1004  NA 139 138 138  K 3.7 3.8 4.0  CL 100 100 99  CO2 '29 29 30  '$ GLUCOSE 114* 96 108*  BUN '13 13 13  '$ CREATININE 0.64 0.57 0.63  CALCIUM 9.6 8.8* 9.0  MG  --   --  2.2  PHOS  --   --  3.6   Liver Function Tests: Recent Labs  Lab 12/03/18 0556  AST 20  ALT 23  ALKPHOS 59  BILITOT 0.5  PROT 8.2*  ALBUMIN 4.1   No results for input(s): LIPASE, AMYLASE in the last 168 hours. No results for input(s): AMMONIA in the last 168 hours. CBC: Recent Labs  Lab 12/03/18 0556 12/04/18 0819 12/05/18 0518 12/06/18 0424 12/07/18 0446  WBC 17.2* 15.0* 13.6* 12.3* 13.0*  NEUTROABS 14.4*  --   --   --   --   HGB 11.6* 10.3* 10.1* 10.6* 10.4*  HCT 36.0 32.7* 32.7* 33.5* 32.8*  MCV 90.7 92.4 92.4 91.3 90.1  PLT 352 327 347 371 374   Cardiac Enzymes: Recent Labs  Lab 12/03/18 0556 12/05/18 1004 12/05/18 1555  TROPONINI <0.03 <0.03 <0.03   BNP: Invalid input(s): POCBNP CBG: No results for input(s): GLUCAP in the last 168 hours.  Time coordinating discharge:  36 minutes  Signed:  Orson Eva, DO [facility] Pager: [phone] 12/07/2018, 9:19 AM

## 2018-12-07 NOTE — Progress Notes (Signed)
DC instructions given w/ FU appointments, activity level, med changes, diet, and symptoms to return w/. All questions answered. IV dc'd, Will continue to monitor until transported to dc area.

## 2018-12-16 ENCOUNTER — Telehealth: Payer: Self-pay | Admitting: Physical Therapy

## 2018-12-16 NOTE — Telephone Encounter (Signed)
Jennifer Harrison was contacted today regarding temporary reduction of Outpatient Rehabilitation Services due to concerns for community transmission of COVID-19.  Wanted to assess if patient needed to be seen in person by clinician or if we could discuss things over the phone. She is scheduled for a PT visit on 12/27/2018. Her mailbox was full and I was unable to leave a message. Annia Friendly, [place] 12/16/18 2:38 PM

## 2018-12-21 NOTE — Progress Notes (Signed)
[place]  [phone] [phone]) [phone]    ID: Jennifer Harrison DOB: May 07, 1966  MR#: [id number]  JYN#:[id number]  Patient Care Team: Patient, [name] as PCP - General (General Practice) Rolm Bookbinder, MD as Consulting Physician (General Surgery) Zahi Plaskett, Virgie Dad, MD as Consulting Physician (Oncology) Eppie Gibson, MD as Attending Physician (Radiation Oncology) Rockwell Germany, RN as Oncology Nurse Navigator Mauro Kaufmann, RN as Oncology Nurse Navigator Regina Eck, CNM as Referring Physician (Certified Nurse Midwife) [name], MD as Consulting Physician (Plastic Surgery) OTHER MD:   CHIEF COMPLAINT: Estrogen receptor positive breast cancer  CURRENT TREATMENT: [anastrozole]   HISTORY OF CURRENT ILLNESS: From the original intake note:  Jennifer Harrison underwent one-year follow up bilateral diagnostic mammography with tomography for benign right breast calcifications at [facility] on 10/04/2018 showing: Breast Density Category B. There are new calcifications in the upper outer and retroareolar left breast.. Magnification views confirm pleomorphic calcifications spanning approximately 10 cm anterior to posterior diameter, by 8 cm craniocaudal span by approximately 7 cm transverse diameter. While the majority of the calcifications are in the upper-outer quadrant, there are also suspicious calcifications in the central and medial retroareolar left breast, upper inner quadrant. No suspicious mass or distortion is identified in the left breast.   Accordingly on 10/08/2018 she proceeded to biopsies of the left breast areas in question. 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 Prognostic indicators significant for: estrogen receptor, 50% positive with moderate staining intensity and progesterone receptor, 50%  positive with moderate-weak staining intensity. Proliferation marker Ki67 at <1%. HER2 negative (1+) by immunohistochemistry.   She underwent an additional left breast biopsy on the same day. The pathology from this retroareolar upper outer quadrant procedure showed ([id number]): ductal carcinoma in situ with calcifications, intermediate grade.  Prognostic panel was not obtained on this tissue.  Then, she underwent an ultrasound of bilateral axillae on 10/14/2018 showing no abnormalities within either axilla. No abnormal lymph nodes are identified.   Magnification views of the right breast show mammographically stable predominately punctate calcifications in the upper inner quadrant, a few of which demonstrate layering. There are additional scattered calcifications in the inner inner right breast. No suspicious mass or architectural distortion on the right.    Finally, she underwent a biopsy of the right breast area in question on 10/14/2018. The pathology from this procedure showed ([id number]): atypical ductal hyperplasia with calcifications, lobular neoplasia (atypical lobular hyperplasia), and fibrocystic changes with calcifications.   The patient's subsequent history is as detailed below.   INTERVAL HISTORY: Jennifer Harrison returns today for follow-up and treatment of her estrogen receptor positive breast cancer.   Since her last visit here, she underwent a left mastectomy and right lumpectomy on 11/22/2018.  Note that the patient had stopped tamoxifen a week prior to surgery and had not resumed tamoxifen postop.  The pathology from this procedure showed ([id number]): 1. Breast, lumpectomy, right w/seed - focus of atypical ductal hyperplasia, 0.2 cm. See note. - negative for carcinoma. - background breast parenchyma with fibrocystic change. - biopsy site changes. 2. Breast, simple mastectomy, left total - focus of residual invasive ductal carcinoma, grade i, 0.3 cm. See note. - extensive ductal  carcinoma in situ, intermediate nuclear grade-two foci. I. First focus measuring 3.0 cm and containing the above-mentioned focus of invasive Carcinoma (coil-shaped clip). II. Second focus, located 7 cm apart from the first  focus and measuring 1.5 cm (x-shaped Clip). - negative for lymphovascular or perinuclear invasion. - all resection margins are negative for carcinoma (greater than 1 cm). - two intraparenchymal lymph nodes, negative for carcinoma (0/2). - biopsy site changes. - see oncology table. 3. Lymph node, sentinel, biopsy, left axillary - lymph node, negative for carcinoma (0/1). 4. Lymph node, sentinel, biopsy, left - lymph node, negative for carcinoma (0/1). 5. Lymph node, sentinel, biopsy, left   - lymph node, negative for carcinoma (0/1).  She did remarkably well from her surgery until approximately 12/01/2018, when she started having some left shoulder pain and shortness of breath.  She presented to the emergency room at [facility]  on 12/03/2018 where she was found to be hemodynamically stable.  She underwent a CT chest angiography with contrast on the same day showing Multifocal acute segmental and subsegmental pulmonary emboli. Negative for right heart strain. Atelectasis and lingular infarct. Small left pleural effusion.  Echocardiogram was poor quality but otherwise unremarkable.  She also underwent a chest xray on 12/05/2018 showing: Suspected mild left perihilar edema.  Small left pleural effusion. Additional lingular and bilateral lower lobe atelectasis.   She was started on intravenous heparin and continued on that until the day of discharge, 12/07/2019 when she was switched to apixaban.   REVIEW OF SYSTEMS: Jennifer Harrison states that her recent surgery went well. She has been doing much better since her pulmonary embolism. She has been off of tamoxifen since a week before surgery. She notes that she took birth control for 5 years without complication. Following the  surgery, she made sure to walk around and stay as active as possible. She notes that her pain began in her left shoulder and that it hurt to take a breath. She notes that her oxygen levels have been in the high 90th percentiles at home. For exercise, she is currently trying to walk as much as possible.  As COVID-19 precautions, she has been restricting visitors and staying at home as much as possible. Her husband is an essential employee with the city of [place], but he is taking appropriate precautions to prevent contamination.   The patient denies unusual headaches, visual changes, nausea, vomiting, or dizziness. There has been no unusual cough or phlegm production. This been no change in bowel or bladder habits. The patient denies unexplained fatigue or unexplained weight loss, rash, or fever. A detailed review of systems was otherwise noncontributory.    PAST MEDICAL HISTORY: Past Medical History:  Diagnosis Date   Family history of breast cancer      PAST SURGICAL HISTORY: Past Surgical History:  Procedure Laterality Date   BREAST RECONSTRUCTION WITH PLACEMENT OF TISSUE EXPANDER AND ALLODERM Left 11/22/2018   Procedure: LEFT BREAST RECONSTRUCTION WITH TISSUE EXPANDER AND ACELLULAR DERMIS;  Surgeon: [name], MD;  Location: [facility];  Service: Plastics;  Laterality: Left;   Broken Wrist  1999   CESAREAN SECTION     endometrial biopsy  10/15   MASTECTOMY W/ SENTINEL NODE BIOPSY Left 11/22/2018   Procedure: LEFT TOTAL MASTECTOMY WITH LEFT AXILLARY SENTINEL LYMPH NODE BIOPSY;  Surgeon: Rolm Bookbinder, MD;  Location: [facility];  Service: General;  Laterality: Left;   RADIOACTIVE SEED GUIDED EXCISIONAL BREAST BIOPSY Right 11/22/2018   Procedure: RIGHT BREAST RADIOACTIVE SEED GUIDED EXCISIONAL BIOPSY;  Surgeon: Rolm Bookbinder, MD;  Location: [facility];  Service: General;  Laterality: Right;     FAMILY HISTORY: Family  History  Problem Relation  Age of Onset   Breast cancer Mother 6       died 10; "negative genetic testing"   Diabetes Father    Hypertension Father    Hydrocephalus Brother    Breast cancer Maternal Grandmother 94       d. 63   Breast cancer Paternal Grandmother 86       d. 66   Breast cancer Paternal Aunt 63   Breast cancer Paternal Aunt 71       "genetic testing negative"   Other Maternal Grandfather        WWII   Lymphoma Paternal Grandfather    Acacia's father died from heart complications following a myocardial infarction at age 18. Patients' mother died from metastatic breast cancer at age 35. The patient has 1 brother and 1 sister. Safire's mother was diagnosed with breast cancer at age 24. Iza's paternal grandmother, maternal grandmother, and two paternal aunts were diagnosed with breast cancer. Patient denies anyone in her family having ovarian, prostate, or pancreatic cancer. Redonna's paternal grandfather was diagnosed with lymphoma.    GYNECOLOGIC HISTORY:  No LMP recorded. (Menstrual status: Perimenopausal). 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 She is divorced. She lives alone with her dog, a Engineer, petroleum mix (150 lbs). Her significant other, [name], works for the [employer] at a water treatment facility. Shebra has one child, [name], who is 67, lives in [place], and graduated from [employer] with a degree in [employer]. [name] is currently working a Insurance claims handler.  ADVANCED DIRECTIVES: Not in place. Apple was given the appropriate healthcare power of attorney form at the 10/20/2018 visit to fill out and return at her discretion   HEALTH MAINTENANCE: Social History   Tobacco Use   Smoking  status: Former Smoker    Last attempt to quit: 09/08/1993    Years since quitting: 25.3   Smokeless tobacco: Never Used  Substance Use Topics   Alcohol use: Yes    Comment: 1-2 a month   Drug use: No    Colonoscopy: no  PAP: yes, 09/10/2018  Bone density: no   Allergies  Allergen Reactions   Sulfa Antibiotics Other (See Comments)    Pt unsure   Penicillins Rash    Did it involve swelling of the face/tongue/throat, SOB, or low BP? No Did it involve sudden or severe rash/hives, skin peeling, or any reaction on the inside of your mouth or nose? Yes Did you need to seek medical attention at a hospital or doctor's office? No When did it last happen?unknown If all above answers are NO, may proceed with cephalosporin use.     Current Outpatient Medications  Medication Sig Dispense Refill   apixaban (ELIQUIS) 5 MG TABS tablet Take 2 tablets (10 mg total) by mouth 2 (two) times daily. Starting 12/13/18--take 1 tablet (5 mg) two times daily 71 tablet 0   methocarbamol (ROBAXIN) 500 MG tablet Take 1 tablet (500 mg total) by mouth every 8 (eight) hours as needed for muscle spasms. 30 tablet 0   Multiple Vitamin (MULTIVITAMIN) tablet Take 1 tablet by mouth daily.       oxyCODONE (OXY IR/ROXICODONE) 5 MG immediate release tablet Take 1-2 tablets (5-10 mg total) by mouth every 4 (four) hours as  needed for moderate pain. 25 tablet 0   tamoxifen (NOLVADEX) 20 MG tablet Take one tablet daily 90 tablet 12   VITAMIN D PO Take 1 capsule by mouth daily.      No current facility-administered medications for this visit.      OBJECTIVE: Middle-aged white woman who appears stated age  21:   12/22/18 0909  BP: (!) 169/109  Pulse: 86  Resp: 18  Temp: 99 F (37.2 C)  SpO2: 100%     Body mass index is 39.66 kg/m.   Wt Readings from Last 3 Encounters:  12/22/18 223 lb 14.4 oz (101.6 kg)  12/03/18 227 lb 1.2 oz (103 kg)  11/22/18 227 lb 1.2 oz (103 kg)   We ambulated the  patient off oxygen.  Baseline saturation was 92%.  However with ambulation it dropped to 89%.    ECOG FS:1 - Symptomatic but completely ambulatory  Sclerae unicteric, EOMs intact Wearing a mask No cervical or supraclavicular adenopathy Lungs no rales or rhonchi Heart regular rate and rhythm Abd soft, nontender, positive bowel sounds MSK no focal spinal tenderness, no upper extremity lymphedema Neuro: nonfocal, well oriented, appropriate affect Breasts: The right breast is status post lumpectomy.  The incision is healing nicely without erythema, dehiscence, or swelling.  The cosmetic result is excellent.  The left breast is status post mastectomy with expander in place.  The incision is healing well, with no dehiscence, erythema, or swelling.  Both axillae are benign.    LAB RESULTS:  CMP     Component Value Date/Time   NA 138 12/05/2018 1004   NA 141 08/06/2017 0927   K 4.0 12/05/2018 1004   CL 99 12/05/2018 1004   CO2 30 12/05/2018 1004   GLUCOSE 108 (H) 12/05/2018 1004   BUN 13 12/05/2018 1004   BUN 9 08/06/2017 0927   CREATININE 0.63 12/05/2018 1004   CREATININE 0.80 10/20/2018 0831   CREATININE 0.74 07/30/2016 1049   CALCIUM 9.0 12/05/2018 1004   PROT 8.2 (H) 12/03/2018 0556   PROT 7.1 08/06/2017 0927   ALBUMIN 4.1 12/03/2018 0556   ALBUMIN 4.3 08/06/2017 0927   AST 20 12/03/2018 0556   AST 34 10/20/2018 0831   ALT 23 12/03/2018 0556   ALT 60 (H) 10/20/2018 0831   ALKPHOS 59 12/03/2018 0556   BILITOT 0.5 12/03/2018 0556   BILITOT <0.2 (L) 10/20/2018 0831   GFRNONAA >60 12/05/2018 1004   GFRNONAA >60 10/20/2018 0831   GFRAA >60 12/05/2018 1004   GFRAA >60 10/20/2018 0831    No results found for: TOTALPROTELP, ALBUMINELP, A1GS, A2GS, BETS, BETA2SER, GAMS, MSPIKE, SPEI  No results found for: KPAFRELGTCHN, LAMBDASER, KAPLAMBRATIO  Lab Results  Component Value Date   WBC 13.0 (H) 12/07/2018   NEUTROABS 14.4 (H) 12/03/2018   HGB 10.4 (L) 12/07/2018   HCT 32.8  (L) 12/07/2018   MCV 90.1 12/07/2018   PLT 374 12/07/2018    '@LASTCHEMISTRY'$ @  No results found for: LABCA2  No components found for: JGOTLX726  No results for input(s): INR in the last 168 hours.  No results found for: LABCA2  No results found for: OMB559  No results found for: RCB638  No results found for: GTX646  No results found for: CA2729  No components found for: HGQUANT  No results found for: CEA1 / No results found for: CEA1   No results found for: AFPTUMOR  No results found for: [place]  No results found for: PSA1  No visits with results within  3 Day(s) from this visit.  Latest known visit with results is:  Admission on 12/03/2018, Discharged on 12/07/2018  Component Date Value Ref Range Status   Sodium 12/03/2018 139  135 - 145 mmol/L Final   Potassium 12/03/2018 3.7  3.5 - 5.1 mmol/L Final   Chloride 12/03/2018 100  98 - 111 mmol/L Final   CO2 12/03/2018 29  22 - 32 mmol/L Final   Glucose, Bld 12/03/2018 114* 70 - 99 mg/dL Final   BUN 12/03/2018 13  6 - 20 mg/dL Final   Creatinine, Ser 12/03/2018 0.64  0.44 - 1.00 mg/dL Final   Calcium 12/03/2018 9.6  8.9 - 10.3 mg/dL Final   Total Protein 12/03/2018 8.2* 6.5 - 8.1 g/dL Final   Albumin 12/03/2018 4.1  3.5 - 5.0 g/dL Final   AST 12/03/2018 20  15 - 41 U/L Final   ALT 12/03/2018 23  0 - 44 U/L Final   Alkaline Phosphatase 12/03/2018 59  38 - 126 U/L Final   Total Bilirubin 12/03/2018 0.5  0.3 - 1.2 mg/dL Final   GFR calc non Af Amer 12/03/2018 >60  >60 mL/min Final   GFR calc Af Amer 12/03/2018 >60  >60 mL/min Final   Anion gap 12/03/2018 10  5 - 15 Final   Performed at [facility], [address] [place], [place] [address]   WBC 12/03/2018 17.2* 4.0 - 10.5 K/uL Final   RBC 12/03/2018 3.97  3.87 - 5.11 MIL/uL Final   Hemoglobin 12/03/2018 11.6* 12.0 - 15.0 g/dL Final   HCT 12/03/2018 36.0  36.0 - 46.0 % Final   MCV 12/03/2018 90.7  80.0 - 100.0 fL Final   MCH 12/03/2018  29.2  26.0 - 34.0 pg Final   MCHC 12/03/2018 32.2  30.0 - 36.0 g/dL Final   RDW 12/03/2018 12.2  11.5 - 15.5 % Final   Platelets 12/03/2018 352  150 - 400 K/uL Final   nRBC 12/03/2018 0.0  0.0 - 0.2 % Final   Neutrophils Relative % 12/03/2018 82  % Final   Neutro Abs 12/03/2018 14.4* 1.7 - 7.7 K/uL Final   Lymphocytes Relative 12/03/2018 8  % Final   Lymphs Abs 12/03/2018 1.3  0.7 - 4.0 K/uL Final   Monocytes Relative 12/03/2018 8  % Final   Monocytes Absolute 12/03/2018 1.3* 0.1 - 1.0 K/uL Final   Eosinophils Relative 12/03/2018 1  % Final   Eosinophils Absolute 12/03/2018 0.1  0.0 - 0.5 K/uL Final   Basophils Relative 12/03/2018 0  % Final   Basophils Absolute 12/03/2018 0.0  0.0 - 0.1 K/uL Final   Immature Granulocytes 12/03/2018 1  % Final   Abs Immature Granulocytes 12/03/2018 0.08* 0.00 - 0.07 K/uL Final   Performed at [facility], [address], [place], [place] [address]   B Natriuretic Peptide 12/03/2018 63.0  0.0 - 100.0 pg/mL Final   Performed at [facility], [address] [place], [place] [address]   Troponin I 12/03/2018 <0.03  <0.03 ng/mL Final   Performed at [facility], [address], [place],  [address]   D-Dimer, Quant 12/03/2018 1.96* 0.00 - 0.50 ug/mL-FEU Final   Comment: (NOTE) At the manufacturer cut-off of 0.50 ug/mL FEU, this assay has been documented to exclude PE with a sensitivity and negative predictive value of 97 to 99%.  At this time, this assay has not been approved by the FDA to exclude DVT/VTE. Results should be correlated with clinical presentation. Performed at [facility], [place]  [address] [place], [place] [address]    aPTT 12/03/2018 33  24 - 36 seconds Final   Performed at [facility], [address] [place], [place] [address]   Prothrombin Time 12/03/2018 14.1  11.4 - 15.2 seconds Final   INR 12/03/2018 1.1  0.8 - 1.2 Final   Comment: (NOTE) INR goal varies based on device and disease states. Performed at  [facility], [address] [place], [place] [address]    Heparin Unfractionated 12/03/2018 0.38  0.30 - 0.70 IU/mL Final   Comment: (NOTE) If heparin results are below expected values, and patient dosage has  been confirmed, suggest follow up testing of antithrombin III levels. Performed at [facility], [address], [place], [place] [address]    HIV Screen 4th Generation wRfx 12/03/2018 Non Reactive  Non Reactive Final   Comment: (NOTE) Performed At: [facility] [place], [place] [id number] Rush Farmer MD HU:[id number]    Weight 12/03/2018 3,633.18  oz Final   Height 12/03/2018 63  in Final   BP 12/03/2018 170/103  mmHg Final   Heparin Unfractionated 12/03/2018 0.12* 0.30 - 0.70 IU/mL Final   Comment: (NOTE) If heparin results are below expected values, and patient dosage has  been confirmed, suggest follow up testing of antithrombin III levels. Performed at [facility], [address] [place], [place] [address]    Sodium 12/04/2018 138  135 - 145 mmol/L Final   Potassium 12/04/2018 3.8  3.5 - 5.1 mmol/L Final   Chloride 12/04/2018 100  98 - 111 mmol/L Final   CO2 12/04/2018 29  22 - 32 mmol/L Final   Glucose, Bld 12/04/2018 96  70 - 99 mg/dL Final   BUN 12/04/2018 13  6 - 20 mg/dL Final   Creatinine, Ser 12/04/2018 0.57  0.44 - 1.00 mg/dL Final   Calcium 12/04/2018 8.8* 8.9 - 10.3 mg/dL Final   GFR calc non Af Amer 12/04/2018 >60  >60 mL/min Final   GFR calc Af Amer 12/04/2018 >60  >60 mL/min Final   Anion gap 12/04/2018 9  5 - 15 Final   Performed at [facility], [address], [place] [address]   WBC 12/04/2018 15.0* 4.0 - 10.5 K/uL Final   RBC 12/04/2018 3.54* 3.87 - 5.11 MIL/uL Final   Hemoglobin 12/04/2018 10.3* 12.0 - 15.0 g/dL Final   HCT 12/04/2018 32.7* 36.0 - 46.0 % Final   MCV 12/04/2018 92.4  80.0 - 100.0 fL Final   MCH 12/04/2018 29.1  26.0 - 34.0 pg Final   MCHC 12/04/2018 31.5  30.0 - 36.0 g/dL  Final   RDW 12/04/2018 12.2  11.5 - 15.5 % Final   Platelets 12/04/2018 327  150 - 400 K/uL Final   nRBC 12/04/2018 0.0  0.0 - 0.2 % Final   Performed at [facility], [address] [place], [place] [address]   Heparin Unfractionated 12/04/2018 0.50  0.30 - 0.70 IU/mL Final   Comment: (NOTE) If heparin results are below expected values, and patient dosage has  been confirmed, suggest follow up testing of antithrombin III levels. Performed at [facility], [address], [place], [place] [address]    Heparin Unfractionated 12/05/2018 0.41  0.30 - 0.70 IU/mL Final   Comment: (NOTE) If heparin results are below expected values, and patient dosage has  been confirmed, suggest follow up testing of antithrombin III levels. Performed at [facility], [address], [place],  [address]    WBC 12/05/2018 13.6* 4.0 - 10.5  K/uL Final   RBC 12/05/2018 3.54* 3.87 - 5.11 MIL/uL Final   Hemoglobin 12/05/2018 10.1* 12.0 - 15.0 g/dL Final   HCT 12/05/2018 32.7* 36.0 - 46.0 % Final   MCV 12/05/2018 92.4  80.0 - 100.0 fL Final   MCH 12/05/2018 28.5  26.0 - 34.0 pg Final   MCHC 12/05/2018 30.9  30.0 - 36.0 g/dL Final   RDW 12/05/2018 12.0  11.5 - 15.5 % Final   Platelets 12/05/2018 347  150 - 400 K/uL Final   nRBC 12/05/2018 0.0  0.0 - 0.2 % Final   Performed at [facility], [address], [place], [place] [address]   Troponin I 12/05/2018 <0.03  <0.03 ng/mL Final   Performed at [facility], [address] [place], [place] [address]   Troponin I 12/05/2018 <0.03  <0.03 ng/mL Final   Performed at [facility], [address], [place], [place] [address]   Sodium 12/05/2018 138  135 - 145 mmol/L Final   Potassium 12/05/2018 4.0  3.5 - 5.1 mmol/L Final   Chloride 12/05/2018 99  98 - 111 mmol/L Final   CO2 12/05/2018 30  22 - 32 mmol/L Final   Glucose, Bld 12/05/2018 108* 70 - 99 mg/dL Final   BUN 12/05/2018 13  6 - 20 mg/dL Final   Creatinine, Ser 12/05/2018 0.63  0.44  - 1.00 mg/dL Final   Calcium 12/05/2018 9.0  8.9 - 10.3 mg/dL Final   GFR calc non Af Amer 12/05/2018 >60  >60 mL/min Final   GFR calc Af Amer 12/05/2018 >60  >60 mL/min Final   Anion gap 12/05/2018 9  5 - 15 Final   Performed at [facility], [address], [place], [place] [address]   Magnesium 12/05/2018 2.2  1.7 - 2.4 mg/dL Final   Performed at [facility], [address], [place], [place] [address]   Phosphorus 12/05/2018 3.6  2.5 - 4.6 mg/dL Final   Performed at [facility], [address], [place], [place] [address]   B Natriuretic Peptide 12/05/2018 25.0  0.0 - 100.0 pg/mL Final   Performed at [facility], [address] [place], [place] [address]   Procalcitonin 12/05/2018 <0.10  ng/mL Final   Comment:        Interpretation: PCT (Procalcitonin) <= 0.5 ng/mL: Systemic infection (sepsis) is not likely. Local bacterial infection is possible. (NOTE)       Sepsis PCT Algorithm           Lower Respiratory Tract                                      Infection PCT Algorithm    ----------------------------     ----------------------------         PCT < 0.25 ng/mL                PCT < 0.10 ng/mL         Strongly encourage             Strongly discourage   discontinuation of antibiotics    initiation of antibiotics    ----------------------------     -----------------------------       PCT 0.25 - 0.50 ng/mL            PCT 0.10 - 0.25 ng/mL               OR       >  80% decrease in PCT            Discourage initiation of                                            antibiotics      Encourage discontinuation           of antibiotics    ----------------------------     -----------------------------         PCT >= 0.50 ng/mL              PCT 0.26 - 0.50 ng/mL               AND                                 <80% decrease in PCT             Encourage initiation of                                             antibiotics       Encourage continuation           of antibiotics     ----------------------------     -----------------------------        PCT >= 0.50 ng/mL                  PCT > 0.50 ng/mL               AND         increase in PCT                  Strongly encourage                                      initiation of antibiotics    Strongly encourage escalation           of antibiotics                                     -----------------------------                                           PCT <= 0.25 ng/mL                                                 OR                                        > 80% decrease in PCT  Discontinue / Do not initiate                                             antibiotics Performed at [facility], [address], [place], [place] [address]    Heparin Unfractionated 12/06/2018 0.48  0.30 - 0.70 IU/mL Final   Comment: (NOTE) If heparin results are below expected values, and patient dosage has  been confirmed, suggest follow up testing of antithrombin III levels. Performed at [facility], [address] [place], [place] [address]    WBC 12/06/2018 12.3* 4.0 - 10.5 K/uL Final   RBC 12/06/2018 3.67* 3.87 - 5.11 MIL/uL Final   Hemoglobin 12/06/2018 10.6* 12.0 - 15.0 g/dL Final   HCT 12/06/2018 33.5* 36.0 - 46.0 % Final   MCV 12/06/2018 91.3  80.0 - 100.0 fL Final   MCH 12/06/2018 28.9  26.0 - 34.0 pg Final   MCHC 12/06/2018 31.6  30.0 - 36.0 g/dL Final   RDW 12/06/2018 12.0  11.5 - 15.5 % Final   Platelets 12/06/2018 371  150 - 400 K/uL Final   nRBC 12/06/2018 0.0  0.0 - 0.2 % Final   Performed at [facility], [address], [place], [place] [address]   WBC 12/07/2018 13.0* 4.0 - 10.5 K/uL Final   RBC 12/07/2018 3.64* 3.87 - 5.11 MIL/uL Final   Hemoglobin 12/07/2018 10.4* 12.0 - 15.0 g/dL Final   HCT 12/07/2018 32.8* 36.0 - 46.0 % Final   MCV 12/07/2018 90.1  80.0 - 100.0 fL Final   MCH 12/07/2018 28.6  26.0 - 34.0 pg Final   MCHC 12/07/2018 31.7  30.0 - 36.0 g/dL  Final   RDW 12/07/2018 12.0  11.5 - 15.5 % Final   Platelets 12/07/2018 374  150 - 400 K/uL Final   nRBC 12/07/2018 0.0  0.0 - 0.2 % Final   Performed at [facility], [address] [place], [place] [address]    (this displays the last labs from the last 3 days)  No results found for: TOTALPROTELP, ALBUMINELP, A1GS, A2GS, BETS, BETA2SER, GAMS, MSPIKE, SPEI (this displays SPEP labs)  No results found for: KPAFRELGTCHN, LAMBDASER, KAPLAMBRATIO (kappa/lambda light chains)  No results found for: HGBA, HGBA2QUANT, HGBFQUANT, HGBSQUAN (Hemoglobinopathy evaluation)   No results found for: LDH  Lab Results  Component Value Date   IRON 65 07/30/2016   TIBC 439 07/30/2016   IRONPCTSAT 15 07/30/2016   (Iron and TIBC)  Lab Results  Component Value Date   FERRITIN 33 07/30/2016    Urinalysis    Component Value Date/Time   COLORURINE YELLOW 07/29/2013 1501   APPEARANCEUR CLEAR 07/29/2013 1501   LABSPEC 1.005 07/29/2013 1501   PHURINE 6.5 07/29/2013 1501   GLUCOSEU NEG 07/29/2013 1501   HGBUR NEG 07/29/2013 1501   BILIRUBINUR n 07/30/2016 0840   KETONESUR NEG 07/29/2013 1501   PROTEINUR n 07/30/2016 0840   PROTEINUR NEG 07/29/2013 1501   UROBILINOGEN negative 07/30/2016 0840   UROBILINOGEN 0.2 07/29/2013 1501   NITRITE n 07/30/2016 0840   NITRITE NEG 07/29/2013 1501   LEUKOCYTESUR Negative 07/30/2016 0840     STUDIES:  Ct Angio Chest Pe W And/or Wo Contrast  Result Date: 12/03/2018 CLINICAL DATA:  Shortness of breath. Right-sided lumpectomy and left mastectomy 11/22/2018. EXAM: CT ANGIOGRAPHY CHEST WITH CONTRAST TECHNIQUE: Multidetector CT imaging of the chest was performed using the standard protocol during  bolus administration of intravenous contrast. Multiplanar CT image reconstructions and MIPs were obtained to evaluate the vascular anatomy. CONTRAST:  164m OMNIPAQUE IOHEXOL 350 MG/ML SOLN COMPARISON:  None. FINDINGS: Cardiovascular: Multifocal central branching  pulmonary artery filling defects consistent with acute emboli, labeled on thin axial series. All lobes are affected by subsegmental clot. Segmental clot is seen within the left lower lobe. RV to LV ratio is between 0.8 and 0.9. Borderline heart size. No pericardial effusion. Negative aorta. Mediastinum/Nodes: Negative for adenopathy Lungs/Pleura: Streaky opacities in the lower lungs consistent with atelectasis. There is also ground-glass opacity in the lingula more compatible with infarct. No pulmonary edema. Small left pleural effusion. Upper Abdomen: Negative Musculoskeletal: No acute or aggressive finding. Other: Changes of right lumpectomy and left-sided mastectomy with tissue expander. Critical Value/emergent results were called by telephone at the time of interpretation on 12/03/2018 at 6:59 am to Dr. DVeryl Speak, who verbally acknowledged these results. Review of the MIP images confirms the above findings. IMPRESSION: 1. Multifocal acute segmental and subsegmental pulmonary emboli. Negative for right heart strain. 2. Atelectasis and lingular infarct. 3. Small left pleural effusion. Electronically Signed   By: JMonte FantasiaM.D.   On: 12/03/2018 07:00   Dg Chest Port 1 View  Result Date: 12/05/2018 CLINICAL DATA:  Shortness of breath, breast cancer, postop PE EXAM: PORTABLE CHEST 1 VIEW COMPARISON:  CTA chest dated 12/03/2018 FINDINGS: Low lung volumes. Mild left perihilar opacity, favoring mild interstitial edema. Additional lingular and bilateral lower lobe atelectasis is suspected. Small left pleural effusion. No pneumothorax. The heart is top-normal in size when accounting for inspiration. IMPRESSION: Suspected mild left perihilar edema.  Small left pleural effusion. Additional lingular and bilateral lower lobe atelectasis. Electronically Signed   By: SJulian HyM.D.   On: 12/05/2018 09:22     ELIGIBLE FOR AVAILABLE RESEARCH PROTOCOL: no   ASSESSMENT: 53y.o. 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  (2) tamoxifen started neoadjuvantly 10/20/2018, stopped 11/26/2018 (a week prior to surgery) and not resumed postop  (3) status post left total mastectomy with sentinel lymph node sampling 11/22/2018 for a pT1a pN0, stage IA invasive ductal carcinoma, grade 1, with negative margins  (a) a total of 3 sentinel and 2 intramammary lymph nodes removed  (b) immediate left expander placement to be followed by  (4) status post right lumpectomy 11/22/2018 showing atypical ductal hyperplasia  (5) multifocal acute segmental and subsegmental pulmonary embolism documented by CT angiogram 12/03/2018; no hypotension, negative for right heart strain  (a) IV heparin 12/03/2018 through 12/06/2018  (b) started apixaban 12/06/2018, 10 mg BID x 7 d, then 5 mg BID  (6) Genetic testing through [facility] Common Hereditary Cancers Panel completed on 12/03/2017 showed no deleterious mutations in: APC, ATM, AXIN2, BARD1, BMPR1A, BRCA1, BRCA2, BRIP1, CDH1, CDK4, CDKN2A (p14ARF), CDKN2A (p16INK4a), CHEK2, CTNNA1, DICER1, EPCAM*, GREM1*, KIT, MEN1, MLH1, MSH2, MSH3, MSH6, MUTYH, NBN, NF1, PALB2, PDGFRA, PMS2, POLD1, POLE, PTEN, RAD50, RAD51C, RAD51D, SDHB, SDHC, SDHD, SMAD4, SMARCA4, STK11, TP53, TSC1, TSC2, VHL The following genes were evaluated for sequence changes only: HOXB13*, NTHL1*, SDHA.  (a)  a Variant of Uncertain Significance, c.133G>A (p.Ala45Thr), was identified in [facility]   PLAN: We discussed Lashonda's situation extensively today.  First we reviewed her pulmonary embolus.  Recall she took oral contraceptives for many years with no complications, and she had been off tamoxifen preop and  did not resume it postop.  Nevertheless tamoxifen has a very long half-life and it may have contributed to the clot although clearly the provoking incident was the surgery itself.  The point here is that I would prefer not to go back on tamoxifen in this case but consider aromatase inhibitors.  However she is not clearly postmenopausal: She had a very slight.  In January 2020, more like spotting she says, and she has had no prior.  Since 2018.  We also discussed her pathology report which shows a pT1a tumor.  The recommendation in this case this is to "consider antiestrogens".  Actually she is very motivated to receive antiestrogens because of her family history and a breast cancer concerns.  The plan then is going to be to check an [facility] and estradiol level before her next visit here and if she is menopausal then start anastrozole.  If not we will consider other options  We checked her oxygen today and she was hopeful that we could stop it but she is not quite there yet.  She does have stairs at home.  If she can walk around and walk upstairs without desaturating then she will be able to come off the oxygen at that time.  When she gets to that point she is going to call us and let us know.  She does have a pulse ox at home which they have been using.  She is tolerating the apixaban with no bruising or bleeding.  We are going to continue that a minimum of 3 months.  We will check a d-dimer late Jennifer and she will see me after the July 4 holiday.  If everything looks well we will probably stop apixaban at that time  She has an appointment with physical therapy by WebEx 12/27/2018.  We discussed the possibility of a compression sleeve  Ermel knows to call for any other issue that may develop before her next visit here.   Hakop Humbarger, Virgie Dad, MD  12/22/18 9:47 AM Medical Oncology and Hematology [facility] [address] [place], [place] [address] Tel. [phone]    Fax. [phone]    I, Jacqualyn Posey am acting as a Education administrator for Chauncey Cruel, MD.   I, Lurline Del MD, have reviewed the above documentation for accuracy and completeness, and I agree with the above.

## 2018-12-22 ENCOUNTER — Other Ambulatory Visit: Payer: Self-pay

## 2018-12-22 ENCOUNTER — Inpatient Hospital Stay: Payer: No Typology Code available for payment source | Attending: Oncology | Admitting: Oncology

## 2018-12-22 ENCOUNTER — Telehealth: Payer: Self-pay | Admitting: Physical Therapy

## 2018-12-22 ENCOUNTER — Encounter: Payer: Self-pay | Admitting: Oncology

## 2018-12-22 ENCOUNTER — Encounter: Payer: Self-pay | Admitting: Obstetrics and Gynecology

## 2018-12-22 VITALS — BP 169/109 | HR 86 | Temp 99.0°F | Resp 18 | Ht 63.0 in | Wt 223.9 lb

## 2018-12-22 DIAGNOSIS — I2699 Other pulmonary embolism without acute cor pulmonale: Secondary | ICD-10-CM

## 2018-12-22 DIAGNOSIS — N6091 Unspecified benign mammary dysplasia of right breast: Secondary | ICD-10-CM | POA: Diagnosis not present

## 2018-12-22 DIAGNOSIS — C50412 Malignant neoplasm of upper-outer quadrant of left female breast: Secondary | ICD-10-CM | POA: Diagnosis present

## 2018-12-22 DIAGNOSIS — Z170 Estrogen receptor positive status [ER+]: Secondary | ICD-10-CM | POA: Diagnosis not present

## 2018-12-22 DIAGNOSIS — Z79899 Other long term (current) drug therapy: Secondary | ICD-10-CM | POA: Insufficient documentation

## 2018-12-22 DIAGNOSIS — Z9012 Acquired absence of left breast and nipple: Secondary | ICD-10-CM | POA: Diagnosis not present

## 2018-12-22 NOTE — Telephone Encounter (Signed)
Ms. Schaber was contacted today regarding temporary reduction of Outpatient Rehabilitation Services due to concerns for community transmission of COVID-19.  Patient identity was verified.  Assessed if patient needed to be seen in person by clinician (recent fall or acute injury that requires hands on assessment and advice, change in diet order, post-surgical, special cases, etc.).    The patient expressed interest in being contacted for a video visit to continue their plan of care.  The patient verified that they have a video device and internet access for a telehealth visit. We will verify insurance benefits for this telehealth visit and then contact the patient to schedule. We hae tentatively agreed on 12/24/2018 at 11:00 for this visit. Annia Friendly, [place] 12/22/18 11:17 AM

## 2018-12-23 ENCOUNTER — Encounter: Payer: Self-pay | Admitting: Oncology

## 2018-12-23 ENCOUNTER — Telehealth: Payer: Self-pay | Admitting: Oncology

## 2018-12-23 ENCOUNTER — Encounter: Payer: Self-pay | Admitting: *Deleted

## 2018-12-23 ENCOUNTER — Other Ambulatory Visit: Payer: Self-pay | Admitting: Oncology

## 2018-12-23 NOTE — Telephone Encounter (Signed)
Scheduled lab and f/u per sch msg. Mailed printout °

## 2018-12-24 ENCOUNTER — Other Ambulatory Visit: Payer: Self-pay | Admitting: Oncology

## 2018-12-24 ENCOUNTER — Ambulatory Visit: Payer: No Typology Code available for payment source | Attending: General Surgery | Admitting: Physical Therapy

## 2018-12-24 ENCOUNTER — Encounter: Payer: Self-pay | Admitting: Physical Therapy

## 2018-12-24 DIAGNOSIS — Z483 Aftercare following surgery for neoplasm: Secondary | ICD-10-CM | POA: Diagnosis present

## 2018-12-24 DIAGNOSIS — M25612 Stiffness of left shoulder, not elsewhere classified: Secondary | ICD-10-CM | POA: Diagnosis present

## 2018-12-24 DIAGNOSIS — R293 Abnormal posture: Secondary | ICD-10-CM

## 2018-12-24 NOTE — Therapy (Signed)
[name], [place], [address] Phone: [phone]   Fax:  [phone]  Physical Therapy Treatment  Patient Details  Name: Jennifer Harrison MRN: [id number] Date of Birth: 09/13/1965 Referring Provider (PT): Dr. Rolm Bookbinder   Encounter Date: 12/24/2018  Therapy Telehealth Visit:  I connected with Jennifer Harrison today at 10:48 by [facility] video conference and verified that I am speaking with the correct person using two identifiers.  I discussed the limitations, risks, security and privacy concerns of performing an evaluation and management service by Webex and the availability of in person appointments.  I also discussed with the patient that there may be a patient responsible charge related to this service. The patient expressed understanding and agreed to proceed.   The patient's address was confirmed.  Identified to the patient that therapist is a licensed PT in the state of [place].  Verified phone # as [phone] to call in case of technical difficulties.    PT End of Session - 12/24/18 1205    Visit Number  2    Number of Visits  2    PT Start Time  1048    PT Stop Time  1128    PT Time Calculation (min)  40 min    Activity Tolerance  Patient tolerated treatment well    Behavior During Therapy  WFL for tasks assessed/performed       Past Medical History:  Diagnosis Date  . Family history of breast cancer   . Pulmonary embolus ([place]) 2020    Past Surgical History:  Procedure Laterality Date  . BREAST RECONSTRUCTION WITH PLACEMENT OF TISSUE EXPANDER AND ALLODERM Left 11/22/2018   Procedure: LEFT BREAST RECONSTRUCTION WITH TISSUE EXPANDER AND ACELLULAR DERMIS;  Surgeon: [name], MD;  Location: [facility];  Service: Plastics;  Laterality: Left;  . Broken Wrist  1999  . CESAREAN SECTION    . endometrial biopsy  10/15  . MASTECTOMY W/ SENTINEL NODE BIOPSY Left 11/22/2018    Procedure: LEFT TOTAL MASTECTOMY WITH LEFT AXILLARY SENTINEL LYMPH NODE BIOPSY;  Surgeon: Rolm Bookbinder, MD;  Location: [facility];  Service: General;  Laterality: Left;  . RADIOACTIVE SEED GUIDED EXCISIONAL BREAST BIOPSY Right 11/22/2018   Procedure: RIGHT BREAST RADIOACTIVE SEED GUIDED EXCISIONAL BIOPSY;  Surgeon: Rolm Bookbinder, MD;  Location: [facility];  Service: General;  Laterality: Right;    There were no vitals filed for this visit.  Subjective Assessment - 12/24/18 1152    Subjective  Patient was seen via a Telehealth visit today for a post op follow up. She reports she underwent a left mastectomy and sentinel node biopsy (0/3 nodes positive) on 11/22/2018. She had an expander placed on her chest for reconstruction. She also had a right lumpectomy for atypical ductal hyperplasia. She has no need for chemotherapy or radiation. On 12/03/2018, she was admitted to the hospital where she stayed until 12/07/2018 due to a pulmonary embolus. She continues to be on oxygen but reports she is doing much better and did not use her oxygen during the telehealth visit today. She is able ot monitor her O2 sats with a pulse ox at home. She reports she has been walking and climbing her stairs regularly to try to regain lung capacity and get off oxygen.    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 It is ER/PR positive and HER2 negative with  a Ki67 of < 1%. She had a left mastectomy and sentinel node biopsy (0/3 nodes positive) on 11/22/2018. She had a PE on 12/03/2018.    Patient Stated Goals  Learn about lymphedema and see if my arm is ok    Currently in Pain?  Yes    Pain Score  1     Pain Location  Axilla    Pain Orientation  Left    Pain Descriptors / Indicators  Sharp    Pain Type  Surgical pain    Pain Onset  1 to 4 weeks ago    Pain Frequency  Intermittent    Aggravating Factors    Using her arm pain increases but at rest it is 1/10    Pain Relieving Factors  rest         [facility] PT Assessment - 12/24/18 0001      Assessment   Medical Diagnosis  s/p left mastectomy SLNB    Referring Provider (PT)  Dr. Rolm Bookbinder    Onset Date/Surgical Date  11/22/18    Hand Dominance  Right    Prior Therapy  Baselines      Precautions   Precautions  Other (comment)    Precaution Comments  recent surgery and recent PE      Restrictions   Weight Bearing Restrictions  No      Balance Screen   Has the patient fallen in the past 6 months  No    Has the patient had a decrease in activity level because of a fear of falling?   No    Is the patient reluctant to leave their home because of a fear of falling?   No      Home Environment   Living Environment  Private residence    Living Arrangements  Alone   Sometimes lives with her boyfriend   Available Help at Discharge  Family      Prior Function   Level of Independence  Independent    Vocation  Full time employment    Vocation Requirements  Not currently working    Leisure  Walking 10-15 min 2x/day and climbing stairs at home      Cognition   Overall Cognitive Status  Within Functional Limits for tasks assessed      Observation/Other Assessments   Observations  Assessed axilla via telehealth on computer; no redness present. She appears to have dimpling in the left upper chest which is likely the edge of her expander. No signs of infection that were noted.      Observation/Other Assessments-Edema    Edema  --   Examined dorsal hands and posterior forearms; no lymphedema      Posture/Postural Control   Posture/Postural Control  Postural limitations    Postural Limitations  Rounded Shoulders;Forward head      ROM / Strength   AROM / PROM / Strength  AROM      AROM   Overall AROM Comments  Right shoulder WNL; left shoulder measured with goniometer on screen    AROM Assessment Site  Shoulder    Right/Left  Shoulder  Left    Left Shoulder Flexion  147 Degrees    Left Shoulder ABduction  122 Degrees      Strength   Overall Strength  Unable to assess   due to using telehealth       LYMPHEDEMA/ONCOLOGY QUESTIONNAIRE - 12/24/18 1201      Type   Cancer Type  Left breast cancer  Surgeries   Mastectomy Date  11/22/18   Left side   Lumpectomy Date  11/22/18   right side   Sentinel Lymph Node Biopsy Date  11/22/18   on left   Number Lymph Nodes Removed  3      Treatment   Active Chemotherapy Treatment  No    Past Chemotherapy Treatment  No    Active Radiation Treatment  No    Past Radiation Treatment  No    Current Hormone Treatment  No    Drug Name  --    Past Hormone Therapy  Yes    Date  --   Stopped before surgery   Drug Name  Tamoxifen      What other symptoms do you have   Are you Having Heaviness or Tightness  Yes    Are you having Pain  Yes    Are you having pitting edema  No    Is it Hard or Difficult finding clothes that fit  No    Do you have infections  No    Is there Decreased scar mobility  Yes    Stemmer Sign  No      Lymphedema Assessments   Lymphedema Assessments  Upper extremities   Unable to measure via telehealth       Quick Dash - 12/24/18 0001    Open a tight or new jar  Mild difficulty    Do heavy household chores (wash walls, wash floors)  Moderate difficulty    Carry a shopping bag or briefcase  Mild difficulty    Wash your back  Moderate difficulty    Use a knife to cut food  Mild difficulty    Recreational activities in which you take some force or impact through your arm, shoulder, or hand (golf, hammering, tennis)  Severe difficulty    During the past week, to what extent has your arm, shoulder or hand problem interfered with your normal social activities with family, friends, neighbors, or groups?  Quite a bit    During the past week, to what extent has your arm, shoulder or hand problem limited your work or other regular daily  activities  Quite a bit    Arm, shoulder, or hand pain.  Moderate    Tingling (pins and needles) in your arm, shoulder, or hand  Mild    Difficulty Sleeping  No difficulty    DASH Score  43.18 %        PT Education - 12/24/18 1151    Education Details  HEP through [facility] for shoulder ROM and lymphedema risk reduction education    Person(s) Educated  Patient    Methods  Explanation;Demonstration;Handout    Comprehension  Returned demonstration;Verbalized understanding          PT Long Term Goals - 12/24/18 1213      PT LONG TERM GOAL #1   Title  Patient will demonstrate she has regained shoulder ROM and function post operatively compared to her baseline assessment.    Time  8    Period  Weeks    Status  Partially Met            Plan - 12/24/18 1205    Clinical Impression Statement  Patient is doing very well s/p left lumpectomy and after a 4 day hospitalization for PE. She is regaining shoulder ROM and is mostly limited with abduction. She was educated today on additional exercises to do for abduction and made some progress during  the session as she tried the exercises. She is progressing as expected for what she has been through and knows to contact me if her abduction does not continue to improve or if she has any concerns about lymphedema.    Rehab Potential  Excellent    PT Treatment/Interventions  ADLs/Self Care Home Management;Patient/family education;Therapeutic exercise    PT Next Visit Plan  D/C    PT Home Exercise Plan  Post op shoulder ROM HEP    Consulted and Agree with Plan of Care  Patient       Patient will benefit from skilled therapeutic intervention in order to improve the following deficits and impairments:  Decreased range of motion, Impaired UE functional use, Pain, Decreased knowledge of precautions, Postural dysfunction  Visit Diagnosis: Aftercare following surgery for neoplasm  Stiffness of left shoulder, not elsewhere classified  Abnormal  posture     Problem List Patient Active Problem List   Diagnosis Date Noted  . Acute respiratory failure with hypoxia ([place]) 12/03/2018  . Acute pulmonary embolus ([place]) 12/03/2018  . Malignant neoplasm of upper-outer quadrant of left breast in female, estrogen receptor positive ([place]) 10/14/2018  . Genetic testing 12/15/2017  . Family history of breast cancer   . Family history of diabetes mellitus 07/29/2013    Jennifer Harrison,MARTI COOPER 12/24/2018, 12:18 PM  [place], [place], [address] Phone: [phone]   Fax:  [phone]  Name: ZENAYA ULATOWSKI MRN: [id number] Date of Birth: 1966/01/14  PHYSICAL THERAPY DISCHARGE SUMMARY  Visits from Start of Care: 2  Current functional level related to goals / functional outcomes: Goals met except limited by left shoulder abduction ROM.   Remaining deficits: Left shoulder abduction ROM limited to 122 degrees today but as she did the exercises during the session, it appears to improve.   Education / Equipment: Lymphedema risk reduction practices and HEP education.  Plan: Patient agrees to discharge.  Patient goals were partially met. Patient is being discharged due to being pleased with the current functional level.  ?????     Annia Friendly, [place] 12/24/18 12:22 PM

## 2018-12-24 NOTE — Patient Instructions (Signed)
Access Code: [email or web address]  URL: [email or web address]  Date: 12/24/2018  Prepared by: Arbutus Ped   Exercises  Supine Chest Stretch with Elbows Bent - 10 reps - 5-10 seconds hold - 2x daily - 7x weekly  Supine Shoulder Flexion AAROM - 10 reps - 5 hold - 2x daily - 7x weekly  Standing Shoulder Abduction ROM with [place] - 10 reps - 2-3 econds hold - 7x weekly    Educated patient on lymphedema risk reduction practices. She has significant concerns about lymphedema as her grandmother had severe lymphedema after breast cancer. We reviewed the lymphedema risk reduction practices handout and that was e-mailed to her.

## 2018-12-27 ENCOUNTER — Ambulatory Visit: Payer: No Typology Code available for payment source | Admitting: Physical Therapy

## 2018-12-28 ENCOUNTER — Ambulatory Visit (HOSPITAL_COMMUNITY): Payer: No Typology Code available for payment source

## 2018-12-30 ENCOUNTER — Encounter: Payer: Self-pay | Admitting: *Deleted

## 2019-01-01 ENCOUNTER — Encounter: Payer: Self-pay | Admitting: Oncology

## 2019-01-03 ENCOUNTER — Other Ambulatory Visit: Payer: Self-pay | Admitting: *Deleted

## 2019-01-03 ENCOUNTER — Encounter: Payer: Self-pay | Admitting: Oncology

## 2019-01-03 MED ORDER — APIXABAN 5 MG PO TABS: 5.0000 mg | ORAL_TABLET | Freq: Two times a day (BID) | ORAL | 2 refills | Status: DC

## 2019-01-13 ENCOUNTER — Other Ambulatory Visit: Payer: Self-pay | Admitting: Oncology

## 2019-01-13 ENCOUNTER — Telehealth: Payer: Self-pay | Admitting: Oncology

## 2019-01-13 NOTE — Telephone Encounter (Signed)
R/s appt per 5/07 sch message - unable to reach patient left message with appt date and time

## 2019-03-15 ENCOUNTER — Other Ambulatory Visit: Payer: Self-pay

## 2019-03-15 ENCOUNTER — Inpatient Hospital Stay: Payer: No Typology Code available for payment source | Attending: Oncology

## 2019-03-15 DIAGNOSIS — Z820 Family history of epilepsy and other diseases of the nervous system: Secondary | ICD-10-CM | POA: Diagnosis not present

## 2019-03-15 DIAGNOSIS — Z880 Allergy status to penicillin: Secondary | ICD-10-CM | POA: Diagnosis not present

## 2019-03-15 DIAGNOSIS — Z803 Family history of malignant neoplasm of breast: Secondary | ICD-10-CM | POA: Diagnosis not present

## 2019-03-15 DIAGNOSIS — C50412 Malignant neoplasm of upper-outer quadrant of left female breast: Secondary | ICD-10-CM

## 2019-03-15 DIAGNOSIS — Z86718 Personal history of other venous thrombosis and embolism: Secondary | ICD-10-CM | POA: Insufficient documentation

## 2019-03-15 DIAGNOSIS — Z833 Family history of diabetes mellitus: Secondary | ICD-10-CM | POA: Insufficient documentation

## 2019-03-15 DIAGNOSIS — Z87891 Personal history of nicotine dependence: Secondary | ICD-10-CM | POA: Diagnosis not present

## 2019-03-15 DIAGNOSIS — I2699 Other pulmonary embolism without acute cor pulmonale: Secondary | ICD-10-CM

## 2019-03-15 DIAGNOSIS — Z8249 Family history of ischemic heart disease and other diseases of the circulatory system: Secondary | ICD-10-CM | POA: Insufficient documentation

## 2019-03-15 DIAGNOSIS — Z7901 Long term (current) use of anticoagulants: Secondary | ICD-10-CM | POA: Insufficient documentation

## 2019-03-15 DIAGNOSIS — Z170 Estrogen receptor positive status [ER+]: Secondary | ICD-10-CM | POA: Diagnosis not present

## 2019-03-15 DIAGNOSIS — Z79811 Long term (current) use of aromatase inhibitors: Secondary | ICD-10-CM | POA: Diagnosis not present

## 2019-03-15 DIAGNOSIS — Z882 Allergy status to sulfonamides status: Secondary | ICD-10-CM | POA: Diagnosis not present

## 2019-03-15 DIAGNOSIS — Z79899 Other long term (current) drug therapy: Secondary | ICD-10-CM | POA: Insufficient documentation

## 2019-03-15 DIAGNOSIS — Z807 Family history of other malignant neoplasms of lymphoid, hematopoietic and related tissues: Secondary | ICD-10-CM | POA: Diagnosis not present

## 2019-03-15 DIAGNOSIS — Z9012 Acquired absence of left breast and nipple: Secondary | ICD-10-CM | POA: Diagnosis not present

## 2019-03-15 LAB — CBC WITH DIFFERENTIAL/PLATELET
Abs Immature Granulocytes: 0.02 10*3/uL (ref 0.00–0.07)
Basophils Absolute: 0 10*3/uL (ref 0.0–0.1)
Basophils Relative: 0 %
Eosinophils Absolute: 0.2 10*3/uL (ref 0.0–0.5)
Eosinophils Relative: 2 %
HCT: 34.2 % — ABNORMAL LOW (ref 36.0–46.0)
Hemoglobin: 11.3 g/dL — ABNORMAL LOW (ref 12.0–15.0)
Immature Granulocytes: 0 %
Lymphocytes Relative: 16 %
Lymphs Abs: 1.5 10*3/uL (ref 0.7–4.0)
MCH: 28.9 pg (ref 26.0–34.0)
MCHC: 33 g/dL (ref 30.0–36.0)
MCV: 87.5 fL (ref 80.0–100.0)
Monocytes Absolute: 0.7 10*3/uL (ref 0.1–1.0)
Monocytes Relative: 8 %
Neutro Abs: 6.9 10*3/uL (ref 1.7–7.7)
Neutrophils Relative %: 74 %
Platelets: 337 10*3/uL (ref 150–400)
RBC: 3.91 MIL/uL (ref 3.87–5.11)
RDW: 12.7 % (ref 11.5–15.5)
WBC: 9.4 10*3/uL (ref 4.0–10.5)
nRBC: 0 % (ref 0.0–0.2)

## 2019-03-15 LAB — COMPREHENSIVE METABOLIC PANEL
ALT: 39 U/L (ref 0–44)
AST: 26 U/L (ref 15–41)
Albumin: 4.1 g/dL (ref 3.5–5.0)
Alkaline Phosphatase: 69 U/L (ref 38–126)
Anion gap: 7 (ref 5–15)
BUN: 10 mg/dL (ref 6–20)
CO2: 29 mmol/L (ref 22–32)
Calcium: 9.3 mg/dL (ref 8.9–10.3)
Chloride: 104 mmol/L (ref 98–111)
Creatinine, Ser: 0.72 mg/dL (ref 0.44–1.00)
GFR calc Af Amer: >60 mL/min (ref >60)
GFR calc non Af Amer: >60 mL/min (ref >60)
Glucose, Bld: 98 mg/dL (ref 70–99)
Potassium: 4 mmol/L (ref 3.5–5.1)
Sodium: 140 mmol/L (ref 135–145)
Total Bilirubin: 0.2 mg/dL — ABNORMAL LOW (ref 0.3–1.2)
Total Protein: 7.4 g/dL (ref 6.5–8.1)

## 2019-03-15 LAB — D-DIMER, QUANTITATIVE (NOT AT ARMC): D-Dimer, Quant: <0.27 ug/mL-FEU (ref 0.00–0.50)

## 2019-03-16 LAB — FOLLICLE STIMULATING HORMONE: FSH: 32 m[IU]/mL

## 2019-03-16 LAB — ESTRADIOL: Estradiol: 29.3 pg/mL

## 2019-03-22 ENCOUNTER — Encounter (HOSPITAL_COMMUNITY): Payer: Self-pay

## 2019-03-22 NOTE — Progress Notes (Signed)
[place]  [phone] [phone]) [phone]    ID: Jennifer Harrison DOB: November 01, 1965  MR#: [id number]  NID#:[id number]  Patient Care Team: Patient, [name] as PCP - General (General Practice) Rolm Bookbinder, MD as Consulting Physician (General Surgery) Magrinat, Virgie Dad, MD as Consulting Physician (Oncology) Eppie Gibson, MD as Attending Physician (Radiation Oncology) Rockwell Germany, RN as Oncology Nurse Navigator Mauro Kaufmann, RN as Oncology Nurse Navigator Regina Eck, CNM as Referring Physician (Certified Nurse Midwife) [name], MD as Consulting Physician (Plastic Surgery) OTHER MD:   CHIEF COMPLAINT: Estrogen receptor positive breast cancer  CURRENT TREATMENT: To start anastrozole April 09, 2019   INTERVAL HISTORY: [name] returns today for follow-up and treatment of her estrogen receptor positive breast cancer.  She is here today to discuss anastrozole.   She continues on apixaban for her pulmonary embolus/DVT history.  She tolerates this well, with no unusual bruising or bleeding problems.  Since her last visit, she has not undergone any additional studies. Her last mammogram was on 10/04/2018.   REVIEW OF SYSTEMS: Althea reports she walks for exercise and now does yoga. She denies hot flashes, noting the blood thinners she takes actually make her cold. She reports that in the last couple of weeks, she gets nausea before she eats and bowel urgency, with occasional diarrhea. "It comes in waves." She notes she tried drinking less coffee with no change. She also reports tingling and itching to her right breast.  She is back to work in the office. She states only 6 people work in her office, and everyone has their own office and can close their doors. She adds they are seeing clients by appointment only and the office is cleaned twice a week. At home is just her and her boyfriend, Gerald Stabs. Her son, who lives in [place], is  doing well and working. A detailed review of systems was otherwise noncontributory.    HISTORY OF CURRENT ILLNESS: From the original intake note:  Jennifer Harrison underwent 51-year follow up bilateral diagnostic mammography with tomography for benign right breast calcifications at [facility] on 10/04/2018 showing: Breast Density Category B. There are new calcifications in the upper outer and retroareolar left breast.. Magnification views confirm pleomorphic calcifications spanning approximately 10 cm anterior to posterior diameter, by 8 cm craniocaudal span by approximately 7 cm transverse diameter. While the majority of the calcifications are in the upper-outer quadrant, there are also suspicious calcifications in the central and medial retroareolar left breast, upper inner quadrant. No suspicious mass or distortion is identified in the left breast.   Accordingly on 10/08/2018 she proceeded to biopsies of the left breast areas in question. 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 Prognostic indicators significant for: estrogen receptor, 50% positive with moderate staining intensity and progesterone receptor, 50% positive with moderate-weak staining intensity. Proliferation marker Ki67 at <1%. HER2 negative (1+) by immunohistochemistry.   She underwent an additional left breast biopsy on the same day. The pathology from this retroareolar upper outer quadrant procedure showed ([id number]): ductal carcinoma in situ with calcifications, intermediate grade.  Prognostic panel was not obtained on this tissue.  Then, she underwent an ultrasound of bilateral axillae on 10/14/2018 showing no abnormalities within either axilla. No abnormal lymph nodes are identified.   Magnification views of the right breast show mammographically stable predominately punctate calcifications in the upper  inner quadrant, a  few of which demonstrate layering. There are additional scattered calcifications in the inner inner right breast. No suspicious mass or architectural distortion on the right.    Finally, she underwent a biopsy of the right breast area in question on 10/14/2018. The pathology from this procedure showed ([id number]): atypical ductal hyperplasia with calcifications, lobular neoplasia (atypical lobular hyperplasia), and fibrocystic changes with calcifications.   The patient's subsequent history is as detailed below.   PAST MEDICAL HISTORY: Past Medical History:  Diagnosis Date  . Family history of breast cancer   . Pulmonary embolus ([place]) 2020     PAST SURGICAL HISTORY: Past Surgical History:  Procedure Laterality Date  . BREAST RECONSTRUCTION WITH PLACEMENT OF TISSUE EXPANDER AND ALLODERM Left 11/22/2018   Procedure: LEFT BREAST RECONSTRUCTION WITH TISSUE EXPANDER AND ACELLULAR DERMIS;  Surgeon: [name], MD;  Location: [facility];  Service: Plastics;  Laterality: Left;  . Broken Wrist  1999  . CESAREAN SECTION    . endometrial biopsy  10/15  . MASTECTOMY W/ SENTINEL NODE BIOPSY Left 11/22/2018   Procedure: LEFT TOTAL MASTECTOMY WITH LEFT AXILLARY SENTINEL LYMPH NODE BIOPSY;  Surgeon: Rolm Bookbinder, MD;  Location: [facility];  Service: General;  Laterality: Left;  . RADIOACTIVE SEED GUIDED EXCISIONAL BREAST BIOPSY Right 11/22/2018   Procedure: RIGHT BREAST RADIOACTIVE SEED GUIDED EXCISIONAL BIOPSY;  Surgeon: Rolm Bookbinder, MD;  Location: [facility];  Service: General;  Laterality: Right;     FAMILY HISTORY: Family History  Problem Relation Age of Onset  . Breast cancer Mother 92       died 58; "negative genetic testing"  . Diabetes Father   . Hypertension Father   . Hydrocephalus Brother   . Breast cancer Maternal Grandmother 75       d. 79  . Breast cancer Paternal Grandmother 71       d. 23  .  Breast cancer Paternal Aunt 71  . Breast cancer Paternal Aunt 53       "genetic testing negative"  . Other Maternal Grandfather        WWII  . Lymphoma Paternal Grandfather    Kemonie's father died from heart complications following a myocardial infarction at age 64. Patients' mother died from metastatic breast cancer at age 33. The patient has 1 brother and 1 sister. Alaijah's mother was diagnosed with breast cancer at age 51. Eddy's paternal grandmother, maternal grandmother, and two paternal aunts were diagnosed with breast cancer. Patient denies anyone in her family having ovarian, prostate, or pancreatic cancer. Ozella's paternal grandfather was diagnosed with lymphoma.    GYNECOLOGIC HISTORY:  Patient's last menstrual period was 09/23/2017. 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 She lives alone with her dog, a Engineer, petroleum mix (150 lbs). Her significant other, [name], works for the [employer] at a water treatment facility. Angeleigh has one child, [name], who is 29, lives in [place], and graduated from [employer] with a degree in [employer]. [name] is currently working a Insurance claims handler.  ADVANCED DIRECTIVES: Not in place. Evan was given the appropriate healthcare power of attorney form at the 10/20/2018 visit to fill out and return at her discretion   HEALTH MAINTENANCE: Social History   Tobacco  Use  . Smoking status: Former Smoker    Quit date: 09/08/1993    Years since quitting: 25.5  . Smokeless tobacco: Never Used  Substance Use Topics  . Alcohol use: Yes    Comment: 1-2 a month  . Drug use: No    Colonoscopy: no  PAP: yes, 09/10/2018  Bone density: no   Allergies  Allergen Reactions  .  Sulfa Antibiotics Other (See Comments)    Pt unsure  . Penicillins Rash    Did it involve swelling of the face/tongue/throat, SOB, or low BP? No Did it involve sudden or severe rash/hives, skin peeling, or any reaction on the inside of your mouth or nose? Yes Did you need to seek medical attention at a hospital or doctor's office? No When did it last happen?unknown If all above answers are "NO", may proceed with cephalosporin use.     Current Outpatient Medications  Medication Sig Dispense Refill  . anastrozole (ARIMIDEX) 1 MG tablet Take 1 tablet (1 mg total) by mouth daily. 90 tablet 4  . Multiple Vitamin (MULTIVITAMIN) tablet Take 1 tablet by mouth daily.      Marland Kitchen VITAMIN D PO Take 1 capsule by mouth daily.      No current facility-administered medications for this visit.      OBJECTIVE: Middle-aged white woman in no acute distress  Vitals:   03/23/19 1230  BP: (!) 131/41  Pulse: 91  Resp: 18  Temp: 98 F (36.7 C)  SpO2: 99%     Body mass index is 41.27 kg/m.   Wt Readings from Last 3 Encounters:  03/23/19 233 lb (105.7 kg)  12/22/18 223 lb 14.4 oz (101.6 kg)  12/03/18 227 lb 1.2 oz (103 kg)      ECOG FS:1 - Symptomatic but completely ambulatory  Sclerae unicteric, pupils round and equal Wearing a mask No cervical or supraclavicular adenopathy Lungs no rales or rhonchi Heart regular rate and rhythm Abd soft, nontender, positive bowel sounds MSK no focal spinal tenderness, no upper extremity lymphedema Neuro: nonfocal, well oriented, appropriate affect Breasts: The right breast is status post lumpectomy for benign disease.  There are no suspicious findings.  Left breast is status post mastectomy with expander in place.  There is no evidence of local recurrence.  Both axillae are benign.  LAB RESULTS:  CMP     Component Value Date/Time   NA 140 03/15/2019 1125   NA 141 08/06/2017 0927   K 4.0 03/15/2019 1125   CL 104 03/15/2019 1125   CO2 29 03/15/2019  1125   GLUCOSE 98 03/15/2019 1125   BUN 10 03/15/2019 1125   BUN 9 08/06/2017 0927   CREATININE 0.72 03/15/2019 1125   CREATININE 0.80 10/20/2018 0831   CREATININE 0.74 07/30/2016 1049   CALCIUM 9.3 03/15/2019 1125   PROT 7.4 03/15/2019 1125   PROT 7.1 08/06/2017 0927   ALBUMIN 4.1 03/15/2019 1125   ALBUMIN 4.3 08/06/2017 0927   AST 26 03/15/2019 1125   AST 34 10/20/2018 0831   ALT 39 03/15/2019 1125   ALT 60 (H) 10/20/2018 0831   ALKPHOS 69 03/15/2019 1125   BILITOT 0.2 (L) 03/15/2019 1125   BILITOT <0.2 (L) 10/20/2018 0831   GFRNONAA >60 03/15/2019 1125   GFRNONAA >60 10/20/2018 0831   GFRAA >60 03/15/2019 1125   GFRAA >60 10/20/2018 0831    No results found for: TOTALPROTELP, ALBUMINELP, A1GS, A2GS, BETS, BETA2SER, GAMS, MSPIKE, SPEI  No results found for: KPAFRELGTCHN, LAMBDASER, KAPLAMBRATIO  Lab  Results  Component Value Date   WBC 9.4 03/15/2019   NEUTROABS 6.9 03/15/2019   HGB 11.3 (L) 03/15/2019   HCT 34.2 (L) 03/15/2019   MCV 87.5 03/15/2019   PLT 337 03/15/2019    _0 @  No results found for: LABCA2  No components found for: VHQION629  No results for input(s): INR in the last 168 hours.  No results found for: LABCA2  No results found for: BMW413  No results found for: KGM010  No results found for: UVO536  No results found for: CA2729  No components found for: HGQUANT  No results found for: CEA1 / No results found for: CEA1   No results found for: AFPTUMOR  No results found for: CHROMOGRNA  No results found for: PSA1  No visits with results within 3 Day(s) from this visit.  Latest known visit with results is:  Appointment on 03/15/2019  Component Date Value Ref Range Status  . D-Dimer, Quant 03/15/2019 <0.27  0.00 - 0.50 ug/mL-FEU Final   Comment: (NOTE) At the manufacturer cut-off of 0.50 ug/mL FEU, this assay has been documented to exclude PE with a sensitivity and negative predictive value of 97 to 99%.  At this time,  this assay has not been approved by the FDA to exclude DVT/VTE. Results should be correlated with clinical presentation. Performed at [facility], [place] [address], [place], [place] [address]   . Estradiol 03/15/2019 29.3  pg/mL Final   Comment: (NOTE)                    Adult Female:                      Follicular phase   47.4 -   166.0                      Ovulation phase    85.8 -   498.0                      Luteal phase       43.8 -   211.0                      Postmenopausal     <6.0 -    54.7                    Pregnancy                      1st trimester     215.0 - >4300.0                    Girls (1-10 years)    6.0 -    27.0 Roche ECLIA methodology Performed At: [facility] [place], [place] [id number] Rush Farmer MD [id number]   . [facility], [facility] 03/15/2019 32.0  mIU/mL Final   Comment: (NOTE)                    Adult Female:                      Follicular phase      3.5 -  12.5                      Ovulation phase       4.7 -  21.5  Luteal phase          1.7 -   7.7                      Postmenopausal       25.8 - 134.8 Performed At: [facility] [place], [place] [id number] Rush Farmer MD [id number]   . Sodium 03/15/2019 140  135 - 145 mmol/L Final  . Potassium 03/15/2019 4.0  3.5 - 5.1 mmol/L Final  . Chloride 03/15/2019 104  98 - 111 mmol/L Final  . CO2 03/15/2019 29  22 - 32 mmol/L Final  . Glucose, Bld 03/15/2019 98  70 - 99 mg/dL Final  . BUN 03/15/2019 10  6 - 20 mg/dL Final  . Creatinine, Ser 03/15/2019 0.72  0.44 - 1.00 mg/dL Final  . Calcium 03/15/2019 9.3  8.9 - 10.3 mg/dL Final  . Total Protein 03/15/2019 7.4  6.5 - 8.1 g/dL Final  . Albumin 03/15/2019 4.1  3.5 - 5.0 g/dL Final  . AST 03/15/2019 26  15 - 41 U/L Final  . ALT 03/15/2019 39  0 - 44 U/L Final  . Alkaline Phosphatase 03/15/2019 69  38 - 126 U/L Final  . Total Bilirubin 03/15/2019 0.2* 0.3 - 1.2 mg/dL  Final  . GFR calc non Af Amer 03/15/2019 >60  >60 mL/min Final  . GFR calc Af Amer 03/15/2019 >60  >60 mL/min Final  . Anion gap 03/15/2019 7  5 - 15 Final   Performed at [facility], [place] [address], [place], [place] [address]  . WBC 03/15/2019 9.4  4.0 - 10.5 K/uL Final  . RBC 03/15/2019 3.91  3.87 - 5.11 MIL/uL Final  . Hemoglobin 03/15/2019 11.3* 12.0 - 15.0 g/dL Final  . HCT 03/15/2019 34.2* 36.0 - 46.0 % Final  . MCV 03/15/2019 87.5  80.0 - 100.0 fL Final  . MCH 03/15/2019 28.9  26.0 - 34.0 pg Final  . MCHC 03/15/2019 33.0  30.0 - 36.0 g/dL Final  . RDW 03/15/2019 12.7  11.5 - 15.5 % Final  . Platelets 03/15/2019 337  150 - 400 K/uL Final  . nRBC 03/15/2019 0.0  0.0 - 0.2 % Final  . Neutrophils Relative % 03/15/2019 74  % Final  . Neutro Abs 03/15/2019 6.9  1.7 - 7.7 K/uL Final  . Lymphocytes Relative 03/15/2019 16  % Final  . Lymphs Abs 03/15/2019 1.5  0.7 - 4.0 K/uL Final  . Monocytes Relative 03/15/2019 8  % Final  . Monocytes Absolute 03/15/2019 0.7  0.1 - 1.0 K/uL Final  . Eosinophils Relative 03/15/2019 2  % Final  . Eosinophils Absolute 03/15/2019 0.2  0.0 - 0.5 K/uL Final  . Basophils Relative 03/15/2019 0  % Final  . Basophils Absolute 03/15/2019 0.0  0.0 - 0.1 K/uL Final  . Immature Granulocytes 03/15/2019 0  % Final  . Abs Immature Granulocytes 03/15/2019 0.02  0.00 - 0.07 K/uL Final   Performed at [facility], [place] [address], [place], [place] [address]    (this displays the last labs from the last 3 days)  No results found for: TOTALPROTELP, ALBUMINELP, A1GS, A2GS, BETS, BETA2SER, GAMS, MSPIKE, SPEI (this displays SPEP labs)  No results found for: KPAFRELGTCHN, LAMBDASER, KAPLAMBRATIO (kappa/lambda light chains)  No results found for: HGBA, HGBA2QUANT, HGBFQUANT, HGBSQUAN (Hemoglobinopathy evaluation)   No results found for: LDH  Lab Results  Component Value Date   IRON 65 07/30/2016  TIBC 439  07/30/2016   IRONPCTSAT 15 07/30/2016   (Iron and TIBC)  Lab Results  Component Value Date   FERRITIN 33 07/30/2016    Urinalysis    Component Value Date/Time   COLORURINE YELLOW 07/29/2013 1501   APPEARANCEUR CLEAR 07/29/2013 1501   LABSPEC 1.005 07/29/2013 1501   PHURINE 6.5 07/29/2013 1501   GLUCOSEU NEG 07/29/2013 1501   HGBUR NEG 07/29/2013 1501   BILIRUBINUR n 07/30/2016 0840   KETONESUR NEG 07/29/2013 1501   PROTEINUR n 07/30/2016 0840   PROTEINUR NEG 07/29/2013 1501   UROBILINOGEN negative 07/30/2016 0840   UROBILINOGEN 0.2 07/29/2013 1501   NITRITE n 07/30/2016 0840   NITRITE NEG 07/29/2013 1501   LEUKOCYTESUR Negative 07/30/2016 0840     STUDIES:  No results found.   ELIGIBLE FOR AVAILABLE RESEARCH PROTOCOL: no   ASSESSMENT: 53 y.o. 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  (2) tamoxifen started neoadjuvantly 10/20/2018, stopped 11/26/2018 (a week prior to surgery) and not resumed postop  (3) status post left total mastectomy with sentinel lymph node sampling 11/22/2018 for a pT1a pN0, stage IA invasive ductal carcinoma, grade 1, with negative margins  (a) a total of 3 sentinel and 2 intramammary lymph nodes removed  (b) immediate left expander placement to be followed by  (4) status post right lumpectomy 11/22/2018 showing atypical ductal hyperplasia  (5) multifocal acute segmental and subsegmental pulmonary embolism documented by CT angiogram 12/03/2018; no hypotension, negative for right heart strain  (a) IV heparin 12/03/2018 through 12/06/2018  (b) started apixaban 12/06/2018, 10 mg BID x 7 d, then 5 mg BID  (c) D-dimer 03/15/2019 =  less than 0.27  (d) to stop apixaban 04/08/2019  (e) repeat d-dimer October 2020  (6) Genetic testing through [facility] Common Hereditary Cancers Panel completed on 12/03/2017 showed no deleterious mutations in: APC, ATM, AXIN2, BARD1, BMPR1A, BRCA1, BRCA2, BRIP1, CDH1, CDK4, CDKN2A (p14ARF), CDKN2A (p16INK4a), CHEK2, CTNNA1, DICER1, EPCAM*, GREM1*, KIT, MEN1, MLH1, MSH2, MSH3, MSH6, MUTYH, NBN, NF1, PALB2, PDGFRA, PMS2, POLD1, POLE, PTEN, RAD50, RAD51C, RAD51D, SDHB, SDHC, SDHD, SMAD4, SMARCA4, STK11, TP53, TSC1, TSC2, VHL The following genes were evaluated for sequence changes only: HOXB13*, NTHL1*, SDHA.  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  (7) to start anastrozole 04/09/2019  (a) on 03/15/2019 FSH was 32.0 and estradiol 29.3.  PLAN: Gwyndolyn Saxon has been on anticoagulation for her pulmonary embolus more than 3 months.  She will complete 4 months at the end of July and we are stopping her Eliquis at that time.  We did discuss the option of continuing indefinitely at a lower dose, but I think her risk of recurrence is sufficiently low that we can stop and simply observe.  We will repeat a d-dimer in October.  We also checked her for menopausal status.  Her Dean and estradiol are consistent with menopause although just barely.  She has not had any menstrual activity for more than a year.  I think we can try anastrozole and see how she tolerates it.  We will certainly want to recheck her [facility] and estradiol level before her return visit here in October.  She has a good understanding of the possible toxicities side  effects and complications of anastrozole.  She is interested in completing her reconstruction.  After she has been off the Eliquis 5 days, she may proceed to surgery at her and Dr. Para Skeans discretion.  I commended her excellent exercise program and her pandemic precautions.   Magrinat, Virgie Dad, MD  03/23/19 6:33 PM Medical Oncology and  Hematology [facility] [address] [place], [place] [address] Tel. [phone]    Fax. [phone]    I, Wilburn Mylar, am acting as scribe for Dr. Virgie Dad. Magrinat.  I, Lurline Del MD, have reviewed the above documentation for accuracy and completeness, and I agree with the above.

## 2019-03-23 ENCOUNTER — Inpatient Hospital Stay: Payer: No Typology Code available for payment source | Admitting: Oncology

## 2019-03-23 ENCOUNTER — Other Ambulatory Visit: Payer: Self-pay

## 2019-03-23 VITALS — BP 131/41 | HR 91 | Temp 98.0°F | Resp 18 | Ht 63.0 in | Wt 233.0 lb

## 2019-03-23 DIAGNOSIS — C50412 Malignant neoplasm of upper-outer quadrant of left female breast: Secondary | ICD-10-CM | POA: Diagnosis not present

## 2019-03-23 DIAGNOSIS — Z882 Allergy status to sulfonamides status: Secondary | ICD-10-CM

## 2019-03-23 DIAGNOSIS — Z7901 Long term (current) use of anticoagulants: Secondary | ICD-10-CM

## 2019-03-23 DIAGNOSIS — Z79811 Long term (current) use of aromatase inhibitors: Secondary | ICD-10-CM

## 2019-03-23 DIAGNOSIS — Z820 Family history of epilepsy and other diseases of the nervous system: Secondary | ICD-10-CM

## 2019-03-23 DIAGNOSIS — Z79899 Other long term (current) drug therapy: Secondary | ICD-10-CM

## 2019-03-23 DIAGNOSIS — Z833 Family history of diabetes mellitus: Secondary | ICD-10-CM

## 2019-03-23 DIAGNOSIS — Z807 Family history of other malignant neoplasms of lymphoid, hematopoietic and related tissues: Secondary | ICD-10-CM

## 2019-03-23 DIAGNOSIS — Z170 Estrogen receptor positive status [ER+]: Secondary | ICD-10-CM | POA: Diagnosis not present

## 2019-03-23 DIAGNOSIS — Z8249 Family history of ischemic heart disease and other diseases of the circulatory system: Secondary | ICD-10-CM

## 2019-03-23 DIAGNOSIS — Z86718 Personal history of other venous thrombosis and embolism: Secondary | ICD-10-CM

## 2019-03-23 DIAGNOSIS — Z87891 Personal history of nicotine dependence: Secondary | ICD-10-CM

## 2019-03-23 DIAGNOSIS — Z880 Allergy status to penicillin: Secondary | ICD-10-CM

## 2019-03-23 DIAGNOSIS — Z9012 Acquired absence of left breast and nipple: Secondary | ICD-10-CM

## 2019-03-23 DIAGNOSIS — Z803 Family history of malignant neoplasm of breast: Secondary | ICD-10-CM

## 2019-03-23 MED ORDER — ANASTROZOLE 1 MG PO TABS: 1.0000 mg | ORAL_TABLET | Freq: Every day | ORAL | 4 refills | Status: DC

## 2019-03-24 ENCOUNTER — Telehealth: Payer: Self-pay | Admitting: Oncology

## 2019-03-24 NOTE — Telephone Encounter (Signed)
I left a message regarding schedule  

## 2019-03-25 ENCOUNTER — Other Ambulatory Visit: Payer: No Typology Code available for payment source

## 2019-03-25 ENCOUNTER — Ambulatory Visit: Payer: No Typology Code available for payment source | Admitting: Oncology

## 2019-04-02 ENCOUNTER — Other Ambulatory Visit: Payer: Self-pay | Admitting: Oncology

## 2019-04-07 ENCOUNTER — Encounter: Payer: Self-pay | Admitting: *Deleted

## 2019-04-07 ENCOUNTER — Telehealth: Payer: Self-pay | Admitting: Oncology

## 2019-04-07 NOTE — Telephone Encounter (Signed)
Scheduled appt per 7/30 sch message - pt to get new appt next visit.

## 2019-04-09 HISTORY — PX: AUGMENTATION MAMMAPLASTY: SUR837

## 2019-04-14 NOTE — H&P (Signed)
Subjective:     Patient ID: Jennifer Harrison is a 53 y.o. female.  HPI  4.5 months post op. Course complicated by bilateral PE, now off home O2. Completed Eliquis 7.31.20. Plan repeat a d-dimer in October. Plan second stage reconstruction this month.  Presented following screening MMG Jan 2019 with RIGHT breast calcifications. Following diagnostic MMG/US at that time recommended 6 month follow up, which showed stable calcifications. Jan 2020 MMG demonstrated new pleomorphic calcifications in the LEFT UO and retroareolar spanning 10 x 8 x 7 cm. Calcifications are also noted calcifications in the central and medial retroareolar left breast, UIQ. In the RIGHT breast, stable UIQ calcifications noted. [place] bilateral axillae negative.  Biopsy labeled LEFT posterior upper outer showed IDC, ER/PR+, Her2-. Biopsy labeled LEFT retroareolar upper outer showed DCIS, intermediate grade. Biopsy of the RIGHT breast labeled upper inner showed ADH with calcifications, ALH.  Final pathology RIGHT 0.2 cm ADH, LEFT 0.3 cm IDC with extensive intermediate grade DCIS, -LVI, margins clear 0/5 LN  On anastrozole.  Mother passed from metastatic breast ca. Multiple family members with breast ca. 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  Prior 40 D. 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 Has SO. One adult son in [place].  Review of Systems   Objective:   Physical Exam  Cardiovascular: Normal rate, regular rhythm and normal heart sounds.  Pulmonary/Chest: Effort normal and breath sounds normal.   Chest: left scars maturing soft expanded depression contour at UOQ/axilla junction, rippling noted left  SN to nipple R 26 scar pink raised areolar BW R 20 cm Nipple to IMF R 14 cm     Assessment:     Left breast cancer UOQ ER+, Right ADH Family history breast cancer S/p right lumpectomy, left SRM, prepectoral TE/ADM (Alloderm)  reconstruction. Bilateral PE    Plan:   Plan right mastopexy/reduction, removal left chest TE and placement permanent implant left, lipofilling left chest. Will have drain on right for a week, OP surgery. Reviewed risk DVT/PE always present with surgery, but overall surgery will be shorter, less pain, OP surgery and plan has periop Lovenox or Heparin.   Reviewed saline vs silicone, smooth v textured, shaped v round. As in prepectoral position I do recommend HCG or capacity filled silicone implants to reduce risk visible rippling. Reviewed MRI or USsurveillance for rupture with silicone implants.Reviewed examples for 4th generation, capacity filled 4th generation, and HCG implants vs saline implants. Reviewed risks AP flipping that may be more noticeable with 5th generation implants, may require surgery to correct.Discussed risk ALCL with textured devices.She has elected for saline, plan smooth round.  Reviewedrole of fat grafting to improve contour, reduce visible rippling. Discussed variable take graft, need to repeat procedure, fat necrosis that presents as lumps, cysts.Discussed donor site pain, need for compression.Plan abdominal donor site. Asked that she purchase Spanx type garment for post op use.  Reviewed cannot assure cup size, size implants largely selected based on width chest.  Discussed risk COVID infectionthrough this elective surgery. Patient will receive COVID testing prior to surgery. Discussed even if patient receivesa negative test result, the tests in some cases may fail to detect the virus or patient maycontract COVID after the test.COVID 19 infectionbefore/during/aftersurgery may result in lead to a higher chance of complication and death.  Rx for oxycodone, doxycycline, and Robaxin given.  Natrelle 133S FV-13-T 500 ml tissue expander placed,  fill volume 490 ml saline    [name], MD  [facility] Plastic & Reconstructive Surgery [phone],  pin [phone]

## 2019-04-18 ENCOUNTER — Encounter (HOSPITAL_BASED_OUTPATIENT_CLINIC_OR_DEPARTMENT_OTHER): Payer: Self-pay

## 2019-04-18 ENCOUNTER — Other Ambulatory Visit: Payer: Self-pay

## 2019-04-22 ENCOUNTER — Other Ambulatory Visit (HOSPITAL_COMMUNITY)
Admission: RE | Admit: 2019-04-22 | Discharge: 2019-04-22 | Disposition: A | Discharge status: Home / Self Care | Payer: No Typology Code available for payment source | Source: Ambulatory Visit | Attending: Plastic Surgery | Admitting: Plastic Surgery

## 2019-04-22 DIAGNOSIS — Z87891 Personal history of nicotine dependence: Secondary | ICD-10-CM | POA: Diagnosis not present

## 2019-04-22 DIAGNOSIS — Z20828 Contact with and (suspected) exposure to other viral communicable diseases: Secondary | ICD-10-CM | POA: Diagnosis not present

## 2019-04-22 DIAGNOSIS — Z853 Personal history of malignant neoplasm of breast: Secondary | ICD-10-CM | POA: Diagnosis not present

## 2019-04-22 DIAGNOSIS — Z9011 Acquired absence of right breast and nipple: Secondary | ICD-10-CM | POA: Insufficient documentation

## 2019-04-22 DIAGNOSIS — Z01812 Encounter for preprocedural laboratory examination: Secondary | ICD-10-CM | POA: Insufficient documentation

## 2019-04-22 LAB — SARS CORONAVIRUS 2 (TAT 6-24 HRS): SARS Coronavirus 2: NEGATIVE

## 2019-04-22 NOTE — Progress Notes (Signed)

## 2019-04-26 ENCOUNTER — Ambulatory Visit (HOSPITAL_BASED_OUTPATIENT_CLINIC_OR_DEPARTMENT_OTHER): Payer: No Typology Code available for payment source | Admitting: Anesthesiology

## 2019-04-26 ENCOUNTER — Encounter (HOSPITAL_BASED_OUTPATIENT_CLINIC_OR_DEPARTMENT_OTHER): Payer: Self-pay | Admitting: *Deleted

## 2019-04-26 ENCOUNTER — Encounter (HOSPITAL_BASED_OUTPATIENT_CLINIC_OR_DEPARTMENT_OTHER)
Admission: RE | Disposition: A | Discharge status: Home / Self Care | Payer: Self-pay | Source: Home / Self Care | Attending: Plastic Surgery

## 2019-04-26 ENCOUNTER — Ambulatory Visit (HOSPITAL_BASED_OUTPATIENT_CLINIC_OR_DEPARTMENT_OTHER)
Admission: RE | Admit: 2019-04-26 | Discharge: 2019-04-26 | Disposition: A | Discharge status: Home / Self Care | Payer: No Typology Code available for payment source | Attending: Plastic Surgery | Admitting: Plastic Surgery

## 2019-04-26 DIAGNOSIS — Z87891 Personal history of nicotine dependence: Secondary | ICD-10-CM | POA: Diagnosis not present

## 2019-04-26 DIAGNOSIS — Z421 Encounter for breast reconstruction following mastectomy: Secondary | ICD-10-CM | POA: Insufficient documentation

## 2019-04-26 DIAGNOSIS — C50412 Malignant neoplasm of upper-outer quadrant of left female breast: Secondary | ICD-10-CM | POA: Diagnosis not present

## 2019-04-26 DIAGNOSIS — Z79811 Long term (current) use of aromatase inhibitors: Secondary | ICD-10-CM | POA: Insufficient documentation

## 2019-04-26 DIAGNOSIS — Z803 Family history of malignant neoplasm of breast: Secondary | ICD-10-CM | POA: Insufficient documentation

## 2019-04-26 DIAGNOSIS — Z6841 Body Mass Index (BMI) 40.0 and over, adult: Secondary | ICD-10-CM | POA: Diagnosis not present

## 2019-04-26 DIAGNOSIS — Z853 Personal history of malignant neoplasm of breast: Secondary | ICD-10-CM | POA: Diagnosis not present

## 2019-04-26 DIAGNOSIS — Z170 Estrogen receptor positive status [ER+]: Secondary | ICD-10-CM | POA: Insufficient documentation

## 2019-04-26 DIAGNOSIS — Z9012 Acquired absence of left breast and nipple: Secondary | ICD-10-CM | POA: Insufficient documentation

## 2019-04-26 DIAGNOSIS — Z86711 Personal history of pulmonary embolism: Secondary | ICD-10-CM | POA: Insufficient documentation

## 2019-04-26 DIAGNOSIS — N6011 Diffuse cystic mastopathy of right breast: Secondary | ICD-10-CM | POA: Insufficient documentation

## 2019-04-26 HISTORY — PX: REMOVAL OF TISSUE EXPANDER AND PLACEMENT OF IMPLANT: SHX6457

## 2019-04-26 HISTORY — PX: BREAST REDUCTION SURGERY: SHX8

## 2019-04-26 HISTORY — PX: REDUCTION MAMMAPLASTY: SUR839

## 2019-04-26 HISTORY — PX: LIPOSUCTION WITH LIPOFILLING: SHX6436

## 2019-04-26 SURGERY — REMOVAL, TISSUE EXPANDER, BREAST, WITH IMPLANT INSERTION
Anesthesia: General | Site: Chest | Laterality: Right

## 2019-04-26 MED ORDER — PHENYLEPHRINE HCL (PRESSORS) 10 MG/ML IV SOLN
INTRAVENOUS | Status: DC | PRN
  Administered 2019-04-26 (×2): 40 ug via INTRAVENOUS

## 2019-04-26 MED ORDER — DEXAMETHASONE SODIUM PHOSPHATE 10 MG/ML IJ SOLN
INTRAMUSCULAR | Status: AC
  Filled 2019-04-26: qty 3

## 2019-04-26 MED ORDER — SUGAMMADEX SODIUM 500 MG/5ML IV SOLN
INTRAVENOUS | Status: DC | PRN
  Administered 2019-04-26: 250 mg via INTRAVENOUS

## 2019-04-26 MED ORDER — ENOXAPARIN SODIUM 40 MG/0.4ML ~~LOC~~ SOLN
SUBCUTANEOUS | Status: AC
  Filled 2019-04-26: qty 0.4

## 2019-04-26 MED ORDER — BUPIVACAINE-EPINEPHRINE (PF) 0.5% -1:200000 IJ SOLN
INTRAMUSCULAR | Status: DC | PRN
  Administered 2019-04-26: 20 mL

## 2019-04-26 MED ORDER — EPHEDRINE 5 MG/ML INJ
INTRAVENOUS | Status: AC
  Filled 2019-04-26: qty 10

## 2019-04-26 MED ORDER — 0.9 % SODIUM CHLORIDE (POUR BTL) OPTIME
TOPICAL | Status: DC | PRN
  Administered 2019-04-26: 1000 mL

## 2019-04-26 MED ORDER — PROMETHAZINE HCL 25 MG/ML IJ SOLN: 6.2500 mg | INTRAMUSCULAR | Status: DC | PRN

## 2019-04-26 MED ORDER — SCOPOLAMINE 1 MG/3DAYS TD PT72
MEDICATED_PATCH | TRANSDERMAL | Status: AC
  Filled 2019-04-26: qty 1

## 2019-04-26 MED ORDER — OXYCODONE HCL 5 MG PO TABS
5.0000 mg | ORAL_TABLET | Freq: Once | ORAL | Status: AC | PRN
  Administered 2019-04-26: 16:00:00 5 mg via ORAL

## 2019-04-26 MED ORDER — PHENYLEPHRINE 40 MCG/ML (10ML) SYRINGE FOR IV PUSH (FOR BLOOD PRESSURE SUPPORT)
PREFILLED_SYRINGE | INTRAVENOUS | Status: AC
  Filled 2019-04-26: qty 10

## 2019-04-26 MED ORDER — MIDAZOLAM HCL 5 MG/5ML IJ SOLN
INTRAMUSCULAR | Status: DC | PRN
  Administered 2019-04-26: 2 mg via INTRAVENOUS

## 2019-04-26 MED ORDER — SODIUM CHLORIDE 0.9 % IV SOLN
INTRAVENOUS | Status: AC
  Filled 2019-04-26: qty 500000

## 2019-04-26 MED ORDER — DEXAMETHASONE SODIUM PHOSPHATE 4 MG/ML IJ SOLN
INTRAMUSCULAR | Status: DC | PRN
  Administered 2019-04-26: 4 mg via INTRAVENOUS

## 2019-04-26 MED ORDER — CELECOXIB 200 MG PO CAPS
ORAL_CAPSULE | ORAL | Status: AC
  Filled 2019-04-26: qty 1

## 2019-04-26 MED ORDER — CHLORHEXIDINE GLUCONATE CLOTH 2 % EX PADS: 6.0000 | MEDICATED_PAD | Freq: Once | CUTANEOUS | Status: DC

## 2019-04-26 MED ORDER — FENTANYL CITRATE (PF) 100 MCG/2ML IJ SOLN
INTRAMUSCULAR | Status: AC
  Filled 2019-04-26: qty 2

## 2019-04-26 MED ORDER — LIDOCAINE HCL (PF) 1 % IJ SOLN
INTRAMUSCULAR | Status: AC
  Filled 2019-04-26: qty 60

## 2019-04-26 MED ORDER — ONDANSETRON HCL 4 MG/2ML IJ SOLN
INTRAMUSCULAR | Status: AC
  Filled 2019-04-26: qty 6

## 2019-04-26 MED ORDER — OXYCODONE HCL 5 MG PO TABS
ORAL_TABLET | ORAL | Status: AC
  Filled 2019-04-26: qty 1

## 2019-04-26 MED ORDER — LACTATED RINGERS IV SOLN
INTRAVENOUS | Status: DC
  Administered 2019-04-26 (×2): via INTRAVENOUS

## 2019-04-26 MED ORDER — EPHEDRINE SULFATE 50 MG/ML IJ SOLN
INTRAMUSCULAR | Status: DC | PRN
  Administered 2019-04-26: 10 mg via INTRAVENOUS

## 2019-04-26 MED ORDER — LABETALOL HCL 5 MG/ML IV SOLN
INTRAVENOUS | Status: AC
  Filled 2019-04-26: qty 4

## 2019-04-26 MED ORDER — OXYCODONE HCL 5 MG/5ML PO SOLN: 5.0000 mg | Freq: Once | ORAL | Status: AC | PRN

## 2019-04-26 MED ORDER — ONDANSETRON HCL 4 MG/2ML IJ SOLN
INTRAMUSCULAR | Status: DC | PRN
  Administered 2019-04-26: 4 mg via INTRAVENOUS

## 2019-04-26 MED ORDER — LIDOCAINE 2% (20 MG/ML) 5 ML SYRINGE
INTRAMUSCULAR | Status: AC
  Filled 2019-04-26: qty 10

## 2019-04-26 MED ORDER — FENTANYL CITRATE (PF) 100 MCG/2ML IJ SOLN
25.0000 ug | INTRAMUSCULAR | Status: DC | PRN
  Administered 2019-04-26: 50 ug via INTRAVENOUS

## 2019-04-26 MED ORDER — PROPOFOL 10 MG/ML IV BOLUS
INTRAVENOUS | Status: DC | PRN
  Administered 2019-04-26: 180 mg via INTRAVENOUS

## 2019-04-26 MED ORDER — LABETALOL HCL 5 MG/ML IV SOLN
5.0000 mg | Freq: Once | INTRAVENOUS | Status: AC
  Administered 2019-04-26: 5 mg via INTRAVENOUS

## 2019-04-26 MED ORDER — CELECOXIB 200 MG PO CAPS
200.0000 mg | ORAL_CAPSULE | ORAL | Status: AC
  Administered 2019-04-26: 200 mg via ORAL

## 2019-04-26 MED ORDER — LIDOCAINE HCL (CARDIAC) PF 100 MG/5ML IV SOSY
PREFILLED_SYRINGE | INTRAVENOUS | Status: DC | PRN
  Administered 2019-04-26: 100 mg via INTRAVENOUS

## 2019-04-26 MED ORDER — GABAPENTIN 300 MG PO CAPS
300.0000 mg | ORAL_CAPSULE | ORAL | Status: AC
  Administered 2019-04-26: 300 mg via ORAL

## 2019-04-26 MED ORDER — SUGAMMADEX SODIUM 500 MG/5ML IV SOLN
INTRAVENOUS | Status: AC
  Filled 2019-04-26: qty 5

## 2019-04-26 MED ORDER — VANCOMYCIN HCL IN DEXTROSE 1-5 GM/200ML-% IV SOLN
INTRAVENOUS | Status: AC
  Filled 2019-04-26: qty 400

## 2019-04-26 MED ORDER — PROPOFOL 10 MG/ML IV BOLUS
INTRAVENOUS | Status: AC
  Filled 2019-04-26: qty 20

## 2019-04-26 MED ORDER — SODIUM BICARBONATE 4.2 % IV SOLN
INTRAVENOUS | Status: AC
  Filled 2019-04-26: qty 20

## 2019-04-26 MED ORDER — ROCURONIUM BROMIDE 100 MG/10ML IV SOLN
INTRAVENOUS | Status: DC | PRN
  Administered 2019-04-26: 20 mg via INTRAVENOUS
  Administered 2019-04-26: 50 mg via INTRAVENOUS
  Administered 2019-04-26: 20 mg via INTRAVENOUS

## 2019-04-26 MED ORDER — LABETALOL HCL 5 MG/ML IV SOLN
10.0000 mg | INTRAVENOUS | Status: AC | PRN
  Administered 2019-04-26 (×2): 5 mg via INTRAVENOUS

## 2019-04-26 MED ORDER — ACETAMINOPHEN 500 MG PO TABS
1000.0000 mg | ORAL_TABLET | ORAL | Status: AC
  Administered 2019-04-26: 1000 mg via ORAL

## 2019-04-26 MED ORDER — ACETAMINOPHEN 10 MG/ML IV SOLN: 1000.0000 mg | Freq: Once | INTRAVENOUS | Status: DC | PRN

## 2019-04-26 MED ORDER — GABAPENTIN 300 MG PO CAPS
ORAL_CAPSULE | ORAL | Status: AC
  Filled 2019-04-26: qty 1

## 2019-04-26 MED ORDER — EPINEPHRINE PF 1 MG/ML IJ SOLN
INTRAMUSCULAR | Status: AC
  Filled 2019-04-26: qty 1

## 2019-04-26 MED ORDER — ACETAMINOPHEN 500 MG PO TABS
ORAL_TABLET | ORAL | Status: AC
  Filled 2019-04-26: qty 2

## 2019-04-26 MED ORDER — ARTIFICIAL TEARS OPHTHALMIC OINT
TOPICAL_OINTMENT | OPHTHALMIC | Status: AC
  Filled 2019-04-26: qty 3.5

## 2019-04-26 MED ORDER — BUPIVACAINE-EPINEPHRINE (PF) 0.5% -1:200000 IJ SOLN
INTRAMUSCULAR | Status: AC
  Filled 2019-04-26: qty 30

## 2019-04-26 MED ORDER — MIDAZOLAM HCL 2 MG/2ML IJ SOLN
INTRAMUSCULAR | Status: AC
  Filled 2019-04-26: qty 2

## 2019-04-26 MED ORDER — ROCURONIUM BROMIDE 10 MG/ML (PF) SYRINGE
PREFILLED_SYRINGE | INTRAVENOUS | Status: AC
  Filled 2019-04-26: qty 20

## 2019-04-26 MED ORDER — FENTANYL CITRATE (PF) 100 MCG/2ML IJ SOLN
INTRAMUSCULAR | Status: DC | PRN
  Administered 2019-04-26 (×2): 50 ug via INTRAVENOUS
  Administered 2019-04-26 (×3): 25 ug via INTRAVENOUS

## 2019-04-26 MED ORDER — SODIUM BICARBONATE 4 % IV SOLN
INTRAVENOUS | Status: DC | PRN
  Administered 2019-04-26: 560 mL via INTRAMUSCULAR

## 2019-04-26 MED ORDER — SODIUM CHLORIDE 0.9 % IV SOLN
INTRAVENOUS | Status: DC | PRN
  Administered 2019-04-26: 50 ug/min via INTRAVENOUS

## 2019-04-26 MED ORDER — VANCOMYCIN HCL 10 G IV SOLR
1500.0000 mg | INTRAVENOUS | Status: AC
  Administered 2019-04-26: 1500 mg via INTRAVENOUS

## 2019-04-26 MED ORDER — ENOXAPARIN SODIUM 40 MG/0.4ML ~~LOC~~ SOLN
40.0000 mg | Freq: Once | SUBCUTANEOUS | Status: AC
  Administered 2019-04-26: 40 mg via SUBCUTANEOUS

## 2019-04-26 MED ORDER — KETOROLAC TROMETHAMINE 30 MG/ML IJ SOLN
INTRAMUSCULAR | Status: AC
  Filled 2019-04-26: qty 1

## 2019-04-26 MED ORDER — PROPOFOL 500 MG/50ML IV EMUL
INTRAVENOUS | Status: AC
  Filled 2019-04-26: qty 50

## 2019-04-26 SURGICAL SUPPLY — 97 items
APPLIER CLIP 9.375 MED OPEN (MISCELLANEOUS)
BAG DECANTER FOR FLEXI CONT (MISCELLANEOUS) ×4 IMPLANT
BINDER ABDOMINAL 10 UNV 27-48 (MISCELLANEOUS) IMPLANT
BINDER ABDOMINAL 12 ML 46-62 (SOFTGOODS) ×4 IMPLANT
BINDER ABDOMINAL 12 SM 30-45 (SOFTGOODS) ×4 IMPLANT
BINDER BREAST 3XL (GAUZE/BANDAGES/DRESSINGS) ×4 IMPLANT
BINDER BREAST LRG (GAUZE/BANDAGES/DRESSINGS) IMPLANT
BINDER BREAST MEDIUM (GAUZE/BANDAGES/DRESSINGS) IMPLANT
BINDER BREAST XLRG (GAUZE/BANDAGES/DRESSINGS) IMPLANT
BINDER BREAST XXLRG (GAUZE/BANDAGES/DRESSINGS) IMPLANT
BLADE SURG 10 STRL SS (BLADE) ×12 IMPLANT
BLADE SURG 11 STRL SS (BLADE) ×4 IMPLANT
BLADE SURG 15 STRL LF DISP TIS (BLADE) ×3 IMPLANT
BLADE SURG 15 STRL SS (BLADE) ×1
BNDG GAUZE ELAST 4 BULKY (GAUZE/BANDAGES/DRESSINGS) ×8 IMPLANT
CANISTER LIPO FAT HARVEST (MISCELLANEOUS) ×4 IMPLANT
CANISTER SUCT 1200ML W/VALVE (MISCELLANEOUS) ×4 IMPLANT
CHLORAPREP W/TINT 26 (MISCELLANEOUS) ×8 IMPLANT
CLIP APPLIE 9.375 MED OPEN (MISCELLANEOUS) IMPLANT
CLIP VESOCCLUDE MED 6/CT (CLIP) IMPLANT
COVER BACK TABLE REUSABLE LG (DRAPES) ×4 IMPLANT
COVER MAYO STAND REUSABLE (DRAPES) ×8 IMPLANT
COVER WAND RF STERILE (DRAPES) IMPLANT
DECANTER SPIKE VIAL GLASS SM (MISCELLANEOUS) IMPLANT
DERMABOND ADVANCED (GAUZE/BANDAGES/DRESSINGS) ×2
DERMABOND ADVANCED .7 DNX12 (GAUZE/BANDAGES/DRESSINGS) ×6 IMPLANT
DRAIN CHANNEL 15F RND FF W/TCR (WOUND CARE) ×4 IMPLANT
DRAPE HALF SHEET 70X43 (DRAPES) ×8 IMPLANT
DRAPE SPLIT 6X30 W/TAPE (DRAPES) ×4 IMPLANT
DRAPE TOP ARMCOVERS (MISCELLANEOUS) ×4 IMPLANT
DRAPE UTILITY XL STRL (DRAPES) ×8 IMPLANT
DRSG PAD ABDOMINAL 8X10 ST (GAUZE/BANDAGES/DRESSINGS) ×8 IMPLANT
ELECT BLADE 4.0 EZ CLEAN MEGAD (MISCELLANEOUS)
ELECT COATED BLADE 2.86 ST (ELECTRODE) ×4 IMPLANT
ELECT REM PT RETURN 9FT ADLT (ELECTROSURGICAL) ×4
ELECTRODE BLDE 4.0 EZ CLN MEGD (MISCELLANEOUS) IMPLANT
ELECTRODE REM PT RTRN 9FT ADLT (ELECTROSURGICAL) ×3 IMPLANT
EVACUATOR SILICONE 100CC (DRAIN) IMPLANT
EXTRACTOR CANIST REVOLVE STRL (CANNISTER) IMPLANT
GLOVE BIO SURGEON STRL SZ 6 (GLOVE) ×8 IMPLANT
GLOVE BIO SURGEON STRL SZ 6.5 (GLOVE) ×4 IMPLANT
GLOVE BIO SURGEON STRL SZ7 (GLOVE) ×8 IMPLANT
GLOVE BIOGEL PI IND STRL 6.5 (GLOVE) ×3 IMPLANT
GLOVE BIOGEL PI IND STRL 7.5 (GLOVE) ×9 IMPLANT
GLOVE BIOGEL PI INDICATOR 6.5 (GLOVE) ×1
GLOVE BIOGEL PI INDICATOR 7.5 (GLOVE) ×3
GOWN STRL REUS W/ TWL LRG LVL3 (GOWN DISPOSABLE) ×3 IMPLANT
GOWN STRL REUS W/ TWL XL LVL3 (GOWN DISPOSABLE) ×6 IMPLANT
GOWN STRL REUS W/TWL LRG LVL3 (GOWN DISPOSABLE) ×1
GOWN STRL REUS W/TWL XL LVL3 (GOWN DISPOSABLE) ×2
IMPL BREAST SALINE 700-750C (Breast) ×3 IMPLANT
IMPLANT BREAST SALINE 700-750C (Breast) ×4 IMPLANT
IV NS 500ML (IV SOLUTION)
IV NS 500ML BAXH (IV SOLUTION) IMPLANT
KIT FILL SYSTEM UNIVERSAL (SET/KITS/TRAYS/PACK) IMPLANT
LINER CANISTER 1000CC FLEX (MISCELLANEOUS) ×4 IMPLANT
MARKER SKIN DUAL TIP RULER LAB (MISCELLANEOUS) IMPLANT
NDL SAFETY ECLIPSE 18X1.5 (NEEDLE) ×3 IMPLANT
NEEDLE FILTER BLUNT 18X 1/2SAF (NEEDLE)
NEEDLE FILTER BLUNT 18X1 1/2 (NEEDLE) IMPLANT
NEEDLE HYPO 18GX1.5 SHARP (NEEDLE) ×1
NEEDLE HYPO 25X1 1.5 SAFETY (NEEDLE) ×4 IMPLANT
NS IRRIG 1000ML POUR BTL (IV SOLUTION) ×4 IMPLANT
PACK BASIN DAY SURGERY FS (CUSTOM PROCEDURE TRAY) ×4 IMPLANT
PAD ALCOHOL SWAB (MISCELLANEOUS) ×8 IMPLANT
PENCIL BUTTON HOLSTER BLD 10FT (ELECTRODE) ×4 IMPLANT
PIN SAFETY STERILE (MISCELLANEOUS) ×4 IMPLANT
SIZER BREAST SGL USE HP 650CC (SIZER) ×4
SIZER BRST SGL USE HP 650CC (SIZER) ×3 IMPLANT
SLEEVE SCD COMPRESS KNEE MED (MISCELLANEOUS) ×4 IMPLANT
SLEEVE SURGEON STRL (DRAPES) ×4 IMPLANT
SPONGE LAP 18X18 RF (DISPOSABLE) ×12 IMPLANT
STAPLER VISISTAT 35W (STAPLE) ×4 IMPLANT
SUT ETHILON 2 0 FS 18 (SUTURE) ×8 IMPLANT
SUT MNCRL AB 4-0 PS2 18 (SUTURE) ×16 IMPLANT
SUT PDS AB 2-0 CT2 27 (SUTURE) IMPLANT
SUT SILK 2 0 SH (SUTURE) IMPLANT
SUT VIC AB 3-0 PS1 18 (SUTURE) ×3
SUT VIC AB 3-0 PS1 18XBRD (SUTURE) ×9 IMPLANT
SUT VIC AB 3-0 SH 27 (SUTURE) ×2
SUT VIC AB 3-0 SH 27X BRD (SUTURE) ×6 IMPLANT
SUT VICRYL 4-0 PS2 18IN ABS (SUTURE) ×8 IMPLANT
SYR 10ML LL (SYRINGE) ×12 IMPLANT
SYR 20ML LL LF (SYRINGE) IMPLANT
SYR 50ML LL SCALE MARK (SYRINGE) ×8 IMPLANT
SYR BULB IRRIGATION 50ML (SYRINGE) ×8 IMPLANT
SYR CONTROL 10ML LL (SYRINGE) ×4 IMPLANT
SYR TB 1ML LL NO SAFETY (SYRINGE) ×4 IMPLANT
SYRINGE TOOMEY DISP (SYRINGE) IMPLANT
TAPE MEASURE VINYL STERILE (MISCELLANEOUS) IMPLANT
TOWEL GREEN STERILE FF (TOWEL DISPOSABLE) ×8 IMPLANT
TRAY FOLEY W/BAG SLVR 14FR LF (SET/KITS/TRAYS/PACK) IMPLANT
TUBE CONNECTING 20X1/4 (TUBING) ×8 IMPLANT
TUBING INFILTRATION IT-10001 (TUBING) ×4 IMPLANT
TUBING SET GRADUATE ASPIR 12FT (MISCELLANEOUS) ×4 IMPLANT
UNDERPAD 30X30 (UNDERPADS AND DIAPERS) ×8 IMPLANT
YANKAUER SUCT BULB TIP NO VENT (SUCTIONS) ×4 IMPLANT

## 2019-04-26 NOTE — Anesthesia Postprocedure Evaluation (Signed)
Anesthesia Post Note  Patient: Jennifer Harrison  Procedure(s) Performed: REMOVAL OF LEFT TISSUE EXPANDER AND PLACEMENT OF IMPLANT (Left Breast) RIGHT BREAST REDUCTION (Right Breast) LIPOFILLING FROM ABDOMEN TO LEFT CHEST (Left Chest)     Patient location during evaluation: PACU Anesthesia Type: General Level of consciousness: awake and alert and oriented Pain management: pain level controlled Vital Signs Assessment: post-procedure vital signs reviewed and stable Respiratory status: spontaneous breathing, nonlabored ventilation and respiratory function stable Cardiovascular status: blood pressure returned to baseline and stable Postop Assessment: no apparent nausea or vomiting Anesthetic complications: no    Last Vitals:  Vitals:   04/26/19 1516 04/26/19 1530  BP: (!) 153/97 (!) 153/92  Pulse: 81 85  Resp: (!) 8 (!) 9  Temp:    SpO2: 100% 100%    Last Pain:  Vitals:   04/26/19 1530  TempSrc:   PainSc: Asleep                 Nilda Keathley A.

## 2019-04-26 NOTE — Op Note (Signed)
Operative Note   DATE OF OPERATION: 8.18.20  LOCATION: [place] DIVISION: Plastic Surgery  PREOPERATIVE DIAGNOSES:  1. History left breast cancer 2. Acquired absence breast   POSTOPERATIVE DIAGNOSES:  same  PROCEDURE:  1. Removal left chest tissue expander and placement of saline implant, liposuction left lateral chest 2. Lipofilling to left chest 100 cm2 3. Right breast reduction with liposuction right lateral chest   SURGEON: [name] MD MBA  ASSISTANT: none  ANESTHESIA:  General.   EBL: 50 ml  COMPLICATIONS: None immediate.   INDICATIONS FOR PROCEDURE:  The patient, Jennifer Harrison, is a 53 y.o. female born on [birth year], is here for staged breast reconstruction following left skin reduction pattern mastectomy with immediate prepectoral expander acellular dermis reconstruction. 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   FINDINGS: Complete incorporation left chest ADM. Natrelle Smooth Round High Profile Saline 700 ml implant placed, fill volume 750 ml. REF [id number]. Right breast reduction 383 g. 100 ml fat infiltrated left chest.  DESCRIPTION OF PROCEDURE:  The patient's operative site was marked with the patient in the preoperative areaincluding chest midline, sternal notch, breast meridians, anterior axillary lines. Over right breast location of nipple position marked symmetric with anticipated most projecting point over left implant. With aid with Wise pattern marker, location for NAC and vertical limbs marked (8 cm).Bilateral flanks marked for liposuction. Additional bilateral lateral chest wall marked for liposuction. Patient received SQ Lovenox.The patientwas taken to the operating room. SCDs were placed and IV antibiotics were given. The patient's operative site was prepped and draped in a sterile fashion. A time out was performed and all information was confirmed to be correct.In supine  position, inframammary fold marked over right and superior medial pedicle designed. NAC marked with 42 mm cookie cutter and pedicle de epithelialized. Pedicle developed to chest wall. Medial and lateral flaps developed. Lower pole breast tissue excised. Additional lateral breast tissue excised.. Cavity irrigated. Hemostasis obtained. Local anesthetic infiltrated. 15 Fr JP placed and secured with 2-0 nylon. Breast tailor tacked closed.  Incision made in left IMF scar and carried through superficial fascia and ADM. Expander removed. Capsulotomy performed superiorly. Sizer placed. Patient brought to upright sitting position and Natrelle High Profile 700 ml implant selected. Patient returned to supine position.  Stab incision made over bilateral abdomen. Tumescent infiltrated bilateral flanks and lateral chest wall, total 560 ml. Power assisted liposuction performed to endpoint symmetric soft tissue thickness, total lipoaspirate abdomen 300 ml. Abdominal fat washed with saline and prepared by gravity. Additional liposuction performed over bilateral lateral chest wall. Prepared abdominal fat infiltrated throughout total left mastectomy flap total 100 ml.   Over right breast, closure completed with 3-0 vicryl in dermis along vertical closure and inframammary fold. NAC inset with 4-0 vicryl in dermis. Skin closure completed with 4-0 monocryl subcuticular closure. Over left chest, cavity irrigated with saline solution containing polymyxin bacitracin. Hemostasis ensured. Cavity irrigated with Betadine. Implant placed in cavity, orientation ensured. Patient filled to 750 ml and assessed in sitting position for symmetry. She was returned to supine position. Fill tubing removed and fill tab closure ensured. Closure completed with 3-0 vicryl for closure ADM and superficial fascia, 4-0 vicryl in dermis, and skin closure 4-0 monocryl subcuticular. Dermabond applied to all chest incisions. Abdominal incisions closed with  simple 4-0 monocryl. Dry dressing, abdominal binder, and breast binder applied.  SPECIMENS: right breast reduction  DRAINS: 15 Fr JP in right breast  [name], MD [facility] [phone], pin [phone]

## 2019-04-26 NOTE — Anesthesia Preprocedure Evaluation (Addendum)
Anesthesia Evaluation  Patient identified by MRN, date of birth, ID band Patient awake    Reviewed: Allergy & Precautions, NPO status , Patient's Chart, lab work & pertinent test results  History of Anesthesia Complications Negative for: history of anesthetic complications  Airway Mallampati: III  TM Distance: >3 FB Neck ROM: Full  Mouth opening: Limited Mouth Opening  Dental no notable dental hx.    Pulmonary former smoker, PE (11/2018)   Pulmonary exam normal        Cardiovascular negative cardio ROS Normal cardiovascular exam     Neuro/Psych negative neurological ROS     GI/Hepatic negative GI ROS, Neg liver ROS,   Endo/Other  Morbid obesity  Renal/GU negative Renal ROS     Musculoskeletal negative musculoskeletal ROS (+)   Abdominal   Peds  Hematology negative hematology ROS (+)   Anesthesia Other Findings Day of surgery medications reviewed with the patient.  Reproductive/Obstetrics                           Anesthesia Physical Anesthesia Plan  ASA: II  Anesthesia Plan: General   Post-op Pain Management:    Induction: Intravenous  PONV Risk Score and Plan: 3 and Treatment may vary due to age or medical condition, Ondansetron, Dexamethasone and Midazolam  Airway Management Planned: Oral ETT  Additional Equipment: None  Intra-op Plan:   Post-operative Plan: Extubation in OR  Informed Consent: I have reviewed the patients History and Physical, chart, labs and discussed the procedure including the risks, benefits and alternatives for the proposed anesthesia with the patient or authorized representative who has indicated his/her understanding and acceptance.     Dental advisory given  Plan Discussed with: CRNA  Anesthesia Plan Comments:        Anesthesia Quick Evaluation

## 2019-04-26 NOTE — Discharge Instructions (Signed)

## 2019-04-26 NOTE — Transfer of Care (Signed)
Immediate Anesthesia Transfer of Care Note  Patient: Jennifer Harrison  Procedure(s) Performed: REMOVAL OF LEFT TISSUE EXPANDER AND PLACEMENT OF IMPLANT (Left Breast) RIGHT BREAST REDUCTION (Right Breast) LIPOFILLING FROM ABDOMEN TO LEFT CHEST (Left Chest)  Patient Location: PACU  Anesthesia Type:General  Level of Consciousness: drowsy  Airway & Oxygen Therapy: Patient Spontanous Breathing and Patient connected to face mask oxygen  Post-op Assessment: Report given to RN and Post -op Vital signs reviewed and stable  Post vital signs: Reviewed and stable  Last Vitals:  Vitals Value Taken Time  BP    Temp    Pulse    Resp    SpO2      Last Pain:  Vitals:   04/26/19 0904  TempSrc: Oral  PainSc: 0-No pain         Complications: No apparent anesthesia complications

## 2019-04-26 NOTE — Interval H&P Note (Signed)
History and Physical Interval Note:  04/26/2019 9:44 AM  Jennifer Harrison  has presented today for surgery, with the diagnosis of History breast cancer, acquired absence breast.  The various methods of treatment have been discussed with the patient and family. After consideration of risks, benefits and other options for treatment, the patient has consented to  Procedure(s): REMOVAL OF LEFT TISSUE EXPANDER AND PLACEMENT OF IMPLANT (Left) RIGHT BREAST REDUCTION (Right) LIPOFILLING FROM ABDOMEN TO LEFT CHEST (Left) as a surgical intervention.  The patient's history has been reviewed, patient examined, no change in status, stable for surgery.  I have reviewed the patient's chart and labs.  Questions were answered to the patient's satisfaction.     Arnoldo Hooker Jakaleb Payer

## 2019-04-26 NOTE — Anesthesia Procedure Notes (Signed)
Procedure Name: Intubation Date/Time: 04/26/2019 10:36 AM Performed by: Lavonia Dana, CRNA Pre-anesthesia Checklist: Patient identified, Emergency Drugs available, Suction available and Patient being monitored Patient Re-evaluated:Patient Re-evaluated prior to induction Oxygen Delivery Method: Circle system utilized Preoxygenation: Pre-oxygenation with 100% oxygen Induction Type: IV induction Ventilation: Mask ventilation without difficulty Laryngoscope Size: Glidescope Grade View: Grade I Tube type: Oral Tube size: 7.0 mm Number of attempts: 1 Airway Equipment and Method: Stylet and Oral airway Placement Confirmation: ETT inserted through vocal cords under direct vision,  positive ETCO2 and breath sounds checked- equal and bilateral Secured at: 21 cm Tube secured with: Tape Dental Injury: Teeth and Oropharynx as per pre-operative assessment

## 2019-04-27 ENCOUNTER — Encounter (HOSPITAL_BASED_OUTPATIENT_CLINIC_OR_DEPARTMENT_OTHER): Payer: Self-pay | Admitting: Plastic Surgery

## 2019-06-03 ENCOUNTER — Encounter: Payer: Self-pay | Admitting: *Deleted

## 2019-06-27 ENCOUNTER — Telehealth: Payer: Self-pay | Admitting: Oncology

## 2019-06-27 NOTE — Telephone Encounter (Signed)
Returned patient's phone call regarding rescheduling an appointment, left a voicemail. 

## 2019-06-28 ENCOUNTER — Inpatient Hospital Stay: Payer: No Typology Code available for payment source

## 2019-06-29 ENCOUNTER — Telehealth: Payer: Self-pay | Admitting: Oncology

## 2019-06-29 NOTE — Telephone Encounter (Signed)
Patient called to reschedule October appointments due to a conflict, per patient's request appointment has moved to December.

## 2019-07-05 ENCOUNTER — Ambulatory Visit: Payer: No Typology Code available for payment source | Admitting: Oncology

## 2019-08-10 ENCOUNTER — Inpatient Hospital Stay: Payer: No Typology Code available for payment source | Attending: Oncology

## 2019-08-10 ENCOUNTER — Other Ambulatory Visit: Payer: Self-pay

## 2019-08-10 DIAGNOSIS — I2699 Other pulmonary embolism without acute cor pulmonale: Secondary | ICD-10-CM

## 2019-08-10 DIAGNOSIS — Z170 Estrogen receptor positive status [ER+]: Secondary | ICD-10-CM | POA: Insufficient documentation

## 2019-08-10 DIAGNOSIS — Z803 Family history of malignant neoplasm of breast: Secondary | ICD-10-CM | POA: Insufficient documentation

## 2019-08-10 DIAGNOSIS — Z86711 Personal history of pulmonary embolism: Secondary | ICD-10-CM | POA: Diagnosis not present

## 2019-08-10 DIAGNOSIS — Z79811 Long term (current) use of aromatase inhibitors: Secondary | ICD-10-CM | POA: Diagnosis not present

## 2019-08-10 DIAGNOSIS — C50412 Malignant neoplasm of upper-outer quadrant of left female breast: Secondary | ICD-10-CM

## 2019-08-10 DIAGNOSIS — Z87891 Personal history of nicotine dependence: Secondary | ICD-10-CM | POA: Diagnosis not present

## 2019-08-10 DIAGNOSIS — Z8249 Family history of ischemic heart disease and other diseases of the circulatory system: Secondary | ICD-10-CM | POA: Diagnosis not present

## 2019-08-10 LAB — COMPREHENSIVE METABOLIC PANEL
ALT: 59 U/L — ABNORMAL HIGH (ref 0–44)
AST: 34 U/L (ref 15–41)
Albumin: 4.4 g/dL (ref 3.5–5.0)
Alkaline Phosphatase: 74 U/L (ref 38–126)
Anion gap: 9 (ref 5–15)
BUN: 13 mg/dL (ref 6–20)
CO2: 28 mmol/L (ref 22–32)
Calcium: 9.4 mg/dL (ref 8.9–10.3)
Chloride: 104 mmol/L (ref 98–111)
Creatinine, Ser: 0.77 mg/dL (ref 0.44–1.00)
GFR calc Af Amer: >60 mL/min (ref >60)
GFR calc non Af Amer: >60 mL/min (ref >60)
Glucose, Bld: 78 mg/dL (ref 70–99)
Potassium: 4.5 mmol/L (ref 3.5–5.1)
Sodium: 141 mmol/L (ref 135–145)
Total Bilirubin: 0.2 mg/dL — ABNORMAL LOW (ref 0.3–1.2)
Total Protein: 7.7 g/dL (ref 6.5–8.1)

## 2019-08-10 LAB — CBC WITH DIFFERENTIAL/PLATELET
Abs Immature Granulocytes: 0.03 10*3/uL (ref 0.00–0.07)
Basophils Absolute: 0 10*3/uL (ref 0.0–0.1)
Basophils Relative: 1 %
Eosinophils Absolute: 0.1 10*3/uL (ref 0.0–0.5)
Eosinophils Relative: 2 %
HCT: 36.2 % (ref 36.0–46.0)
Hemoglobin: 12.1 g/dL (ref 12.0–15.0)
Immature Granulocytes: 0 %
Lymphocytes Relative: 16 %
Lymphs Abs: 1.3 10*3/uL (ref 0.7–4.0)
MCH: 28.9 pg (ref 26.0–34.0)
MCHC: 33.4 g/dL (ref 30.0–36.0)
MCV: 86.6 fL (ref 80.0–100.0)
Monocytes Absolute: 0.6 10*3/uL (ref 0.1–1.0)
Monocytes Relative: 8 %
Neutro Abs: 5.8 10*3/uL (ref 1.7–7.7)
Neutrophils Relative %: 73 %
Platelets: 362 10*3/uL (ref 150–400)
RBC: 4.18 MIL/uL (ref 3.87–5.11)
RDW: 12.9 % (ref 11.5–15.5)
WBC: 7.8 10*3/uL (ref 4.0–10.5)
nRBC: 0 % (ref 0.0–0.2)

## 2019-08-10 LAB — D-DIMER, QUANTITATIVE: D-Dimer, Quant: 0.37 ug/mL-FEU (ref 0.00–0.50)

## 2019-08-11 LAB — FOLLICLE STIMULATING HORMONE: FSH: 40.8 m[IU]/mL

## 2019-08-12 LAB — ESTRADIOL, ULTRA SENS: Estradiol, Sensitive: 20.4 pg/mL

## 2019-08-16 NOTE — Progress Notes (Signed)
[facility]  [phone] [phone]    ID: Jennifer Harrison DOB: May 05, 1966  MR#: [id number]  AFB#:[id number]  Patient Care Team: Patient, [name] as PCP - General (General Practice) Rolm Bookbinder, MD as Consulting Physician (General Surgery) Magrinat, Virgie Dad, MD as Consulting Physician (Oncology) Eppie Gibson, MD as Attending Physician (Radiation Oncology) Rockwell Germany, RN as Oncology Nurse Navigator Mauro Kaufmann, RN as Oncology Nurse Navigator Regina Eck, CNM as Referring Physician (Certified Nurse Midwife) [name], MD as Consulting Physician (Plastic Surgery) OTHER MD:   CHIEF COMPLAINT: Estrogen receptor positive breast cancer  CURRENT TREATMENT: Anastrozole   INTERVAL HISTORY: Jennifer Harrison returns today for follow-up and treatment of her estrogen receptor positive breast cancer.  She was started on anastrozole on 51/09/2018.  She is tolerating this remarkably well.  She has had no problems with hot flashes vaginal dryness, arthralgias or myalgias.  She obtains it under good price.  She stopped apixaban 4 months after her pulmonary embolus/DVT.  She had no complications from the medication.  A repeat D-dimer in October 2020 was in the normal range.  Since her last visit, she underwent left breast implant placement and right breast reduction mammoplasty on 04/26/2019 under Dr. Iran Planas. Pathology from the mammoplasty ([id number]) showed fibrocystic changes.  She is due for repeat mammography in January.   REVIEW OF SYSTEMS: [name] is working full-time, mostly in the office, but still in a very safe situation.  For the Thanksgiving holiday she had her significant other and his parents and she did the cooking.  For Christmas she will have her son, (who for Thanksgivings went to his girlfriend's home).  She is not exercising regularly.  A detailed review of systems today was otherwise stable  HISTORY OF CURRENT  ILLNESS: From the original intake note:  Jennifer Harrison underwent 51-year follow up bilateral diagnostic mammography with tomography for benign right breast calcifications at [facility] on 10/04/2018 showing: Breast Density Category B. There are new calcifications in the upper outer and retroareolar left breast.. Magnification views confirm pleomorphic calcifications spanning approximately 10 cm anterior to posterior diameter, by 8 cm craniocaudal span by approximately 7 cm transverse diameter. While the majority of the calcifications are in the upper-outer quadrant, there are also suspicious calcifications in the central and medial retroareolar left breast, upper inner quadrant. No suspicious mass or distortion is identified in the left breast.   Accordingly on 10/08/2018 she proceeded to biopsies of the left breast areas in question. 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 Prognostic indicators significant for: estrogen receptor, 50% positive with moderate staining intensity and progesterone receptor, 50% positive with moderate-weak staining intensity. Proliferation marker Ki67 at <1%. HER2 negative (1+) by immunohistochemistry.   She underwent an additional left breast biopsy on the same day. The pathology from this retroareolar upper outer quadrant procedure showed ([id number]): ductal carcinoma in situ with calcifications, intermediate grade.  Prognostic panel was not obtained on this tissue.  Then, she underwent an ultrasound of bilateral axillae on 10/14/2018 showing no abnormalities within either axilla. No abnormal lymph nodes are identified.   Magnification views of the right breast show mammographically stable predominately punctate calcifications in the upper inner quadrant, a few of which demonstrate layering. There are additional scattered calcifications in the inner  inner right breast. No suspicious mass or architectural distortion on the right.    Finally, she  underwent a biopsy of the right breast area in question on 10/14/2018. The pathology from this procedure showed ([id number]): atypical ductal hyperplasia with calcifications, lobular neoplasia (atypical lobular hyperplasia), and fibrocystic changes with calcifications.   The patient's subsequent history is as detailed below.   PAST MEDICAL HISTORY: Past Medical History:  Diagnosis Date  . Family history of breast cancer   . Pulmonary embolus ([place]) 2020     PAST SURGICAL HISTORY: Past Surgical History:  Procedure Laterality Date  . BREAST RECONSTRUCTION WITH PLACEMENT OF TISSUE EXPANDER AND ALLODERM Left 11/22/2018   Procedure: LEFT BREAST RECONSTRUCTION WITH TISSUE EXPANDER AND ACELLULAR DERMIS;  Surgeon: [name], MD;  Location: [facility];  Service: Plastics;  Laterality: Left;  . BREAST REDUCTION SURGERY Right 04/26/2019   Procedure: RIGHT BREAST REDUCTION;  Surgeon: [name], MD;  Location: [facility];  Service: Plastics;  Laterality: Right;  . Broken Wrist  1999  . CESAREAN SECTION    . endometrial biopsy  10/15  . LIPOSUCTION WITH LIPOFILLING Left 04/26/2019   Procedure: LIPOFILLING FROM ABDOMEN TO LEFT CHEST;  Surgeon: [name], MD;  Location: [facility];  Service: Plastics;  Laterality: Left;  Marland Kitchen MASTECTOMY W/ SENTINEL NODE BIOPSY Left 11/22/2018   Procedure: LEFT TOTAL MASTECTOMY WITH LEFT AXILLARY SENTINEL LYMPH NODE BIOPSY;  Surgeon: Rolm Bookbinder, MD;  Location: [facility];  Service: General;  Laterality: Left;  . RADIOACTIVE SEED GUIDED EXCISIONAL BREAST BIOPSY Right 11/22/2018   Procedure: RIGHT BREAST RADIOACTIVE SEED GUIDED EXCISIONAL BIOPSY;  Surgeon: Rolm Bookbinder, MD;  Location: [facility];  Service: General;  Laterality: Right;  . REMOVAL OF TISSUE EXPANDER AND  PLACEMENT OF IMPLANT Left 04/26/2019   Procedure: REMOVAL OF LEFT TISSUE EXPANDER AND PLACEMENT OF IMPLANT;  Surgeon: [name], MD;  Location: [facility];  Service: Plastics;  Laterality: Left;     FAMILY HISTORY: Family History  Problem Relation Age of Onset  . Breast cancer Mother 52       died 49; "negative genetic testing"  . Diabetes Father   . Hypertension Father   . Hydrocephalus Brother   . Breast cancer Maternal Grandmother 88       d. 79  . Breast cancer Paternal Grandmother 39       d. 46  . Breast cancer Paternal Aunt 71  . Breast cancer Paternal Aunt 27       "genetic testing negative"  . Other Maternal Grandfather        WWII  . Lymphoma Paternal Grandfather    Raylee's father died from heart complications following a myocardial infarction at age 49. Patients' mother died from metastatic breast cancer at age 40. The patient has 1 brother and 1 sister. Denyce's mother was diagnosed with breast cancer at age 74. Fantasha's paternal grandmother, maternal grandmother, and two paternal aunts were diagnosed with breast cancer. Patient denies anyone in her family having ovarian, prostate, or pancreatic cancer. Korena's paternal grandfather was diagnosed with lymphoma.    GYNECOLOGIC HISTORY:  Patient's last menstrual period was 09/23/2017. 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 She is divorced. She lives alone with her dog, a Engineer, petroleum mix (150 lbs). Her significant other, [name], works for the  [employer] at a water treatment facility. Khady has one child, [name], who is 52, lives in [place], and graduated from [employer] with a degree in [employer]. [name] is currently working a Secretary/administrator.  ADVANCED DIRECTIVES: Not in place. Latiffany was given the appropriate healthcare power of attorney form at the 10/20/2018 visit to fill out and return at her discretion   HEALTH MAINTENANCE: Social History   Tobacco Use  . Smoking status: Former Smoker    Quit date: 09/08/1993    Years since quitting: 25.9  . Smokeless tobacco: Never Used  Substance Use Topics  . Alcohol use: Yes    Comment: 1-2 a month  . Drug use: No    Colonoscopy: no  PAP: yes, 09/10/2018  Bone density: no   Allergies  Allergen Reactions  . Sulfa Antibiotics Other (See Comments)    Pt unsure  . Penicillins Rash    Did it involve swelling of the face/tongue/throat, SOB, or low BP? No Did it involve sudden or severe rash/hives, skin peeling, or any reaction on the inside of your mouth or nose? Yes Did you need to seek medical attention at a hospital or doctor's office? No When did it last happen?unknown If all above answers are "NO", may proceed with cephalosporin use.     Current Outpatient Medications  Medication Sig Dispense Refill  . anastrozole (ARIMIDEX) 1 MG tablet Take 1 tablet (1 mg total) by mouth daily. 90 tablet 4  . Multiple Vitamin (MULTIVITAMIN) tablet Take 1 tablet by mouth daily.      Marland Kitchen VITAMIN D PO Take 1 capsule by mouth daily.      No current facility-administered medications for this visit.      OBJECTIVE: Middle-aged white woman who appears well  Vitals:   08/17/19 1527  BP: (!) 153/97  Pulse: 87  Resp: 17  Temp: 98 F (36.7 C)  SpO2: 98%     Body mass index is 42.6 kg/m.   Wt Readings from Last 3 Encounters:  08/17/19 240 lb 8 oz (109.1 kg)  04/26/19 236 lb 1.8 oz (107.1 kg)  03/23/19 233 lb (105.7 kg)      ECOG FS:1 - Symptomatic but completely ambulatory  Sclerae unicteric, EOMs intact Wearing a mask No cervical or supraclavicular adenopathy Lungs no rales or rhonchi Heart regular rate and rhythm Abd soft, nontender, positive bowel sounds MSK  no focal spinal tenderness, no upper extremity lymphedema Neuro: nonfocal, well oriented, appropriate affect Breasts: The right breast is status post reduction mammoplasty.  It is otherwise unremarkable.  The left breast is status post mastectomy with saline implant reconstruction.  The cosmetic result is good.  There is no evidence of local recurrence.  Both axillae are benign.   LAB RESULTS:  CMP     Component Value Date/Time   NA 141 08/10/2019 1033   NA 141 08/06/2017 0927   K 4.5 08/10/2019 1033   CL 104 08/10/2019 1033   CO2 28 08/10/2019 1033   GLUCOSE 78 08/10/2019 1033   BUN 13 08/10/2019 1033   BUN 9 08/06/2017 0927   CREATININE 0.77 08/10/2019 1033   CREATININE 0.80 10/20/2018 0831   CREATININE 0.74 07/30/2016 1049   CALCIUM 9.4 08/10/2019 1033   PROT 7.7 08/10/2019 1033   PROT 7.1 08/06/2017 0927   ALBUMIN 4.4 08/10/2019 1033   ALBUMIN 4.3 08/06/2017 0927   AST 34 08/10/2019 1033   AST 34 10/20/2018 0831   ALT 59 (H) 08/10/2019 1033  ALT 60 (H) 10/20/2018 0831   ALKPHOS 74 08/10/2019 1033   BILITOT 0.2 (L) 08/10/2019 1033   BILITOT <0.2 (L) 10/20/2018 0831   GFRNONAA >60 08/10/2019 1033   GFRNONAA >60 10/20/2018 0831   GFRAA >60 08/10/2019 1033   GFRAA >60 10/20/2018 0831    No results found for: TOTALPROTELP, ALBUMINELP, A1GS, A2GS, BETS, BETA2SER, GAMS, MSPIKE, SPEI  No results found for: KPAFRELGTCHN, LAMBDASER, KAPLAMBRATIO  Lab Results  Component Value Date   WBC 7.8 08/10/2019   NEUTROABS 5.8 08/10/2019   HGB 12.1 08/10/2019   HCT 36.2 08/10/2019   MCV 86.6 08/10/2019   PLT 362 08/10/2019    '@LASTCHEMISTRY'$ @  No results found for: LABCA2  No components found for: JQBHAL937  No results for input(s): INR in the last 168 hours.  No results found for: LABCA2  No results found for: TKW409  No results found for: BDZ329  No results found for: JME268  No results found for: CA2729  No components found for: HGQUANT  No results found  for: CEA1 / No results found for: CEA1   No results found for: AFPTUMOR  No results found for: CHROMOGRNA  No results found for: PSA1  No visits with results within 3 Day(s) from this visit.  Latest known visit with results is:  Appointment on 08/10/2019  Component Date Value Ref Range Status  . Estradiol, Sensitive 08/10/2019 20.4  pg/mL Final   Comment: (NOTE)           Female:            Follicular:                    30.0 - 100.0            Luteal:                        70.0 - 300.0            Postmenopausal:                      < 15.0 This test was developed and its performance characteristics determined by [facility]. It has not been cleared by the Food and Drug Administration. Methodology: Liquid chromatography tandem mass spectrometry(LC/MS/MS) Performed At: [facility] [place], [place] [id number] Rush Farmer MD NL:[id number]   . [facility] 08/10/2019 40.8  mIU/mL Final   Comment: (NOTE)                    Adult Female:                      Follicular phase      3.5 -  12.5                      Ovulation phase       4.7 -  21.5                      Luteal phase          1.7 -   7.7                      Postmenopausal       25.8 - 134.8 Performed At: [facility] [place], [place] [id number] Rush Farmer MD UD:[id number]   . Sodium 08/10/2019 141  135 - 145  mmol/L Final  . Potassium 08/10/2019 4.5  3.5 - 5.1 mmol/L Final  . Chloride 08/10/2019 104  98 - 111 mmol/L Final  . CO2 08/10/2019 28  22 - 32 mmol/L Final  . Glucose, Bld 08/10/2019 78  70 - 99 mg/dL Final  . BUN 08/10/2019 13  6 - 20 mg/dL Final  . Creatinine, Ser 08/10/2019 0.77  0.44 - 1.00 mg/dL Final  . Calcium 08/10/2019 9.4  8.9 - 10.3 mg/dL Final  . Total Protein 08/10/2019 7.7  6.5 - 8.1 g/dL Final  . Albumin 08/10/2019 4.4  3.5 - 5.0 g/dL Final  . AST 08/10/2019 34  15 - 41 U/L Final  . ALT 08/10/2019 59* 0 - 44 U/L Final  . Alkaline Phosphatase  08/10/2019 74  38 - 126 U/L Final  . Total Bilirubin 08/10/2019 0.2* 0.3 - 1.2 mg/dL Final  . GFR calc non Af Amer 08/10/2019 >60  >60 mL/min Final  . GFR calc Af Amer 08/10/2019 >60  >60 mL/min Final  . Anion gap 08/10/2019 9  5 - 15 Final   Performed at [facility], [place] [address], [place], [place] [address]  . WBC 08/10/2019 7.8  4.0 - 10.5 K/uL Final  . RBC 08/10/2019 4.18  3.87 - 5.11 MIL/uL Final  . Hemoglobin 08/10/2019 12.1  12.0 - 15.0 g/dL Final  . HCT 08/10/2019 36.2  36.0 - 46.0 % Final  . MCV 08/10/2019 86.6  80.0 - 100.0 fL Final  . MCH 08/10/2019 28.9  26.0 - 34.0 pg Final  . MCHC 08/10/2019 33.4  30.0 - 36.0 g/dL Final  . RDW 08/10/2019 12.9  11.5 - 15.5 % Final  . Platelets 08/10/2019 362  150 - 400 K/uL Final  . nRBC 08/10/2019 0.0  0.0 - 0.2 % Final  . Neutrophils Relative % 08/10/2019 73  % Final  . Neutro Abs 08/10/2019 5.8  1.7 - 7.7 K/uL Final  . Lymphocytes Relative 08/10/2019 16  % Final  . Lymphs Abs 08/10/2019 1.3  0.7 - 4.0 K/uL Final  . Monocytes Relative 08/10/2019 8  % Final  . Monocytes Absolute 08/10/2019 0.6  0.1 - 1.0 K/uL Final  . Eosinophils Relative 08/10/2019 2  % Final  . Eosinophils Absolute 08/10/2019 0.1  0.0 - 0.5 K/uL Final  . Basophils Relative 08/10/2019 1  % Final  . Basophils Absolute 08/10/2019 0.0  0.0 - 0.1 K/uL Final  . Immature Granulocytes 08/10/2019 0  % Final  . Abs Immature Granulocytes 08/10/2019 0.03  0.00 - 0.07 K/uL Final   Performed at [facility], [place] [address], [place], [place] [address]  . D-Dimer, Quant 08/10/2019 0.37  0.00 - 0.50 ug/mL-FEU Final   Comment: (NOTE) At the manufacturer cut-off of 0.50 ug/mL FEU, this assay has been documented to exclude PE with a sensitivity and negative predictive value of 97 to 99%.  At this time, this assay has not been approved by the FDA to exclude DVT/VTE. Results should be correlated with clinical presentation. Performed at  [facility], [place] [address], [place], [place] [address]     (this displays the last labs from the last 3 days)  No results found for: TOTALPROTELP, ALBUMINELP, A1GS, A2GS, BETS, BETA2SER, GAMS, MSPIKE, SPEI (this displays SPEP labs)  No results found for: KPAFRELGTCHN, LAMBDASER, KAPLAMBRATIO (kappa/lambda light chains)  No results found for: HGBA, HGBA2QUANT, HGBFQUANT, HGBSQUAN (Hemoglobinopathy evaluation)   No results found for: LDH  Lab Results  Component Value Date   IRON 65 07/30/2016   TIBC 439 07/30/2016   IRONPCTSAT 15 07/30/2016   (Iron and TIBC)  Lab Results  Component Value Date   FERRITIN 33 07/30/2016    Urinalysis    Component Value Date/Time   COLORURINE YELLOW 07/29/2013 1501   APPEARANCEUR CLEAR 07/29/2013 1501   LABSPEC 1.005 07/29/2013 1501   PHURINE 6.5 07/29/2013 1501   GLUCOSEU NEG 07/29/2013 1501   HGBUR NEG 07/29/2013 1501   BILIRUBINUR n 07/30/2016 0840   KETONESUR NEG 07/29/2013 1501   PROTEINUR n 07/30/2016 0840   PROTEINUR NEG 07/29/2013 1501   UROBILINOGEN negative 07/30/2016 0840   UROBILINOGEN 0.2 07/29/2013 1501   NITRITE n 07/30/2016 0840   NITRITE NEG 07/29/2013 1501   LEUKOCYTESUR Negative 07/30/2016 0840     STUDIES:  No results found.   ELIGIBLE FOR AVAILABLE RESEARCH PROTOCOL: no   ASSESSMENT: 53 y.o. [place], [place] status post left breast upper outer quadrant biopsy 10/08/2018 for a clinical TX N0 invasive ductal carcinoma, grade 1, estrogen and progesterone receptor positive, HER-2 nonamplified, with an MIB-1 of less than 1%  (1) status post right breast upper inner quadrant biopsy 10/14/2018 showing atypical ductal hyperplasia and atypical lobular hyperplasia  (2) tamoxifen started neoadjuvantly 10/20/2018, stopped 11/26/2018 (a week prior to surgery) and not resumed postop due to development of DVT/PE  (3) status post left total mastectomy with sentinel lymph node sampling 11/22/2018 for a pT1a  pN0, stage IA invasive ductal carcinoma, grade 1, with negative margins  (a) a total of 3 sentinel and 2 intramammary lymph nodes removed  (b) immediate left expander placement followed by saline implant placement 04/26/2019  (4) status post right lumpectomy 11/22/2018 showing atypical ductal hyperplasia  (5) multifocal acute segmental and subsegmental pulmonary embolism documented by CT angiogram 12/03/2018; no hypotension, negative for right heart strain  (a) IV heparin 12/03/2018 through 12/06/2018  (b) started apixaban 12/06/2018, 10 mg BID x 7 d, then 5 mg BID  (c) D-dimer 03/15/2019 = less than 0.27  (d) stopped apixaban 04/08/2019  (e) repeat d-dimer October 2020 WNL  (6) Genetic testing through [facility] Common Hereditary Cancers Panel completed on 12/03/2017 showed no deleterious mutations in: APC, ATM, AXIN2, BARD1, BMPR1A, BRCA1, BRCA2, BRIP1, CDH1, CDK4, CDKN2A (p14ARF), CDKN2A (p16INK4a), CHEK2, CTNNA1, DICER1, EPCAM*, GREM1*, KIT, MEN1, MLH1, MSH2, MSH3, MSH6, MUTYH, NBN, NF1, PALB2, PDGFRA, PMS2, POLD1, POLE, PTEN, RAD50, RAD51C, RAD51D, SDHB, SDHC, SDHD, SMAD4, SMARCA4, STK11, TP53, TSC1, TSC2, VHL The following genes were evaluated for sequence changes only: HOXB13*, NTHL1*, SDHA.  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  (7) to start anastrozole 04/09/2019  (a) on 03/15/2019 [place] was 32.0 and estradiol 29.3.  (b) on 08/10/2019 FSH was 40.8, estradiol 20.4.  PLAN: Gwyndolyn Saxon is now 9 months out from definitive surgery for her breast cancer, with no evidence of disease recurrence.  This is favorable.  She is tolerating anastrozole well and the plan is to continue that a minimum of 5 years.  I am going to continue to follow her [facility] and estradiol for the time being.  She is clearly not a tamoxifen candidate given her prior clotting history.  She will benefit from a bone density which I will schedule at the same time as her mammogram in  January 2020.  I am going to see her late June.  If everything continues well at that time I will start seeing her on a once a year basis thereafter  She knows to call for any other problems that may develop before the next visit.    Magrinat, Virgie Dad, MD  08/17/19 6:28 PM Medical Oncology and Hematology [facility] [place], [place] [address] Tel. [phone]    Fax. [phone]    I, Wilburn Mylar, am acting as scribe for Dr. Virgie Dad. Magrinat.  I, Lurline Del MD, have reviewed the above documentation for accuracy and completeness, and I agree with the above.

## 2019-08-17 ENCOUNTER — Inpatient Hospital Stay (HOSPITAL_BASED_OUTPATIENT_CLINIC_OR_DEPARTMENT_OTHER): Payer: No Typology Code available for payment source | Admitting: Oncology

## 2019-08-17 ENCOUNTER — Other Ambulatory Visit: Payer: Self-pay

## 2019-08-17 VITALS — BP 153/97 | HR 87 | Temp 98.0°F | Resp 17 | Ht 63.0 in | Wt 240.5 lb

## 2019-08-17 DIAGNOSIS — C50412 Malignant neoplasm of upper-outer quadrant of left female breast: Secondary | ICD-10-CM

## 2019-08-17 DIAGNOSIS — Z170 Estrogen receptor positive status [ER+]: Secondary | ICD-10-CM

## 2019-08-18 ENCOUNTER — Telehealth: Payer: Self-pay | Admitting: Oncology

## 2019-08-18 NOTE — Telephone Encounter (Signed)
I talk with patient regarding schedule  

## 2019-08-18 NOTE — Progress Notes (Signed)
Thank you for the update!

## 2019-09-06 ENCOUNTER — Encounter: Payer: Self-pay | Admitting: *Deleted

## 2019-09-15 ENCOUNTER — Other Ambulatory Visit: Payer: Self-pay

## 2019-09-16 ENCOUNTER — Ambulatory Visit: Payer: No Typology Code available for payment source | Admitting: Certified Nurse Midwife

## 2019-09-16 ENCOUNTER — Other Ambulatory Visit: Payer: Self-pay

## 2019-09-16 ENCOUNTER — Other Ambulatory Visit (HOSPITAL_COMMUNITY)
Admission: RE | Admit: 2019-09-16 | Discharge: 2019-09-16 | Disposition: A | Discharge status: Home / Self Care | Payer: No Typology Code available for payment source | Source: Ambulatory Visit | Attending: Certified Nurse Midwife | Admitting: Certified Nurse Midwife

## 2019-09-16 ENCOUNTER — Encounter: Payer: Self-pay | Admitting: Certified Nurse Midwife

## 2019-09-16 VITALS — BP 124/84 | HR 70 | Temp 97.3°F | Resp 16 | Ht 63.5 in | Wt 237.0 lb

## 2019-09-16 DIAGNOSIS — Z0000 Encounter for general adult medical examination without abnormal findings: Secondary | ICD-10-CM

## 2019-09-16 DIAGNOSIS — Z01419 Encounter for gynecological examination (general) (routine) without abnormal findings: Secondary | ICD-10-CM

## 2019-09-16 DIAGNOSIS — Z1211 Encounter for screening for malignant neoplasm of colon: Secondary | ICD-10-CM

## 2019-09-16 DIAGNOSIS — Z853 Personal history of malignant neoplasm of breast: Secondary | ICD-10-CM

## 2019-09-16 DIAGNOSIS — E559 Vitamin D deficiency, unspecified: Secondary | ICD-10-CM

## 2019-09-16 DIAGNOSIS — Z124 Encounter for screening for malignant neoplasm of cervix: Secondary | ICD-10-CM | POA: Diagnosis not present

## 2019-09-16 NOTE — Progress Notes (Signed)
54 y.o. G45P1001 Divorced  Caucasian Fe here for annual exam. Menopausal had labs with oncology and [place] indicates menopause, amenorrhea since 09/23/2017. Denies vaginal dryness or bleeding.Condom use for prevention. No partner change Occasional hot flash, no issues. Diagnosed with breast cancer in left breast in 2/ 2020, has completed all reconstructive surgery now. Feeling well. Continues on Arimidex with oncology management. Request screening labs today. No other health issues today.  Patient's last menstrual period was 09/23/2017.          Sexually active: Yes.    The current method of family planning is condoms all the time.    Exercising: Yes.    walking, yoga Smoker:  no  Review of Systems  Constitutional: Negative.   HENT: Negative.   Eyes: Negative.   Respiratory: Negative.   Cardiovascular: Negative.   Gastrointestinal: Negative.   Genitourinary: Negative.   Musculoskeletal: Negative.   Skin: Negative.   Neurological: Negative.   Endo/Heme/Allergies: Negative.   Psychiatric/Behavioral: Negative.     Health Maintenance: Pap:  07-30-16 neg,09-10-2018 neg HPV HR neg History of Abnormal Pap: no MMG:  Breast cancer 2020 see reports Self Breast exams: yes Colonoscopy:  Patient plans to schedule with someone she knows, dispensed IFOB BMD:   None TDaP:  2012 Shingles: no Pneumonia: no Hep C and HIV: hep c neg 2016 Labs: yes   reports that she quit smoking about 26 years ago. She has never used smokeless tobacco. She reports previous alcohol use. She reports that she does not use drugs.  Past Medical History:  Diagnosis Date  . Breast cancer ([place])   . Family history of breast cancer   . Pulmonary embolus ([place]) 2020    Past Surgical History:  Procedure Laterality Date  . BREAST RECONSTRUCTION WITH PLACEMENT OF TISSUE EXPANDER AND ALLODERM Left 11/22/2018   Procedure: LEFT BREAST RECONSTRUCTION WITH TISSUE EXPANDER AND ACELLULAR DERMIS;  Surgeon: [name], MD;   Location: [facility];  Service: Plastics;  Laterality: Left;  . BREAST REDUCTION SURGERY Right 04/26/2019   Procedure: RIGHT BREAST REDUCTION;  Surgeon: [name], MD;  Location: [facility];  Service: Plastics;  Laterality: Right;  . Broken Wrist  1999  . CESAREAN SECTION    . endometrial biopsy  10/15  . LIPOSUCTION WITH LIPOFILLING Left 04/26/2019   Procedure: LIPOFILLING FROM ABDOMEN TO LEFT CHEST;  Surgeon: [name], MD;  Location: [facility];  Service: Plastics;  Laterality: Left;  Marland Kitchen MASTECTOMY W/ SENTINEL NODE BIOPSY Left 11/22/2018   Procedure: LEFT TOTAL MASTECTOMY WITH LEFT AXILLARY SENTINEL LYMPH NODE BIOPSY;  Surgeon: Rolm Bookbinder, MD;  Location: [facility];  Service: General;  Laterality: Left;  . RADIOACTIVE SEED GUIDED EXCISIONAL BREAST BIOPSY Right 11/22/2018   Procedure: RIGHT BREAST RADIOACTIVE SEED GUIDED EXCISIONAL BIOPSY;  Surgeon: Rolm Bookbinder, MD;  Location: [facility];  Service: General;  Laterality: Right;  . REMOVAL OF TISSUE EXPANDER AND PLACEMENT OF IMPLANT Left 04/26/2019   Procedure: REMOVAL OF LEFT TISSUE EXPANDER AND PLACEMENT OF IMPLANT;  Surgeon: [name], MD;  Location: [facility];  Service: Plastics;  Laterality: Left;    Current Outpatient Medications  Medication Sig Dispense Refill  . anastrozole (ARIMIDEX) 1 MG tablet Take 1 tablet (1 mg total) by mouth daily. 90 tablet 4  . Multiple Vitamin (MULTIVITAMIN) tablet Take 1 tablet by mouth daily.      Marland Kitchen VITAMIN D PO Take 1 capsule by mouth daily.  No current facility-administered medications for this visit.    Family History  Problem Relation Age of Onset  . Breast cancer Mother 59       died 66; "negative genetic testing"  . Diabetes Father   . Hypertension Father   . Hydrocephalus Brother   . Breast cancer Maternal Grandmother 16       d. 42  . Breast cancer Paternal  Grandmother 29       d. 41  . Breast cancer Paternal Aunt 71  . Breast cancer Paternal Aunt 37       "genetic testing negative"  . Other Maternal Grandfather        WWII  . Lymphoma Paternal Grandfather     ROS:  Pertinent items are noted in HPI.  Otherwise, a comprehensive ROS was negative.  Exam:   BP 124/84   Pulse 70   Temp (!) 97.3 F (36.3 C) (Skin)   Resp 16   Ht 5' 3.5" (1.613 m)   Wt 237 lb (107.5 kg)   LMP 09/23/2017   BMI 41.32 kg/m  Height: 5' 3.5" (161.3 cm) Ht Readings from Last 3 Encounters:  09/16/19 5' 3.5" (1.613 m)  08/17/19 5\' 3"  (1.6 m)  04/26/19 5\' 3"  (1.6 m)    General appearance: alert, cooperative and appears stated age Head: Normocephalic, without obvious abnormality, atraumatic Neck: no adenopathy, supple, symmetrical, trachea midline and thyroid normal to inspection and palpation Lungs: clear to auscultation bilaterally Breasts: normal appearance, no masses or tenderness, No nipple retraction or dimpling, No nipple discharge or bleeding, No axillary or supraclavicular adenopathy, surgical scarring from reduction and saline implant noted in left breast and scarring from breast cancer noted Heart: regular rate and rhythm Abdomen: soft, non-tender; no masses,  no organomegaly Extremities: extremities normal, atraumatic, no cyanosis or edema Skin: Skin color, texture, turgor normal. No rashes or lesions Lymph nodes: Cervical, supraclavicular, and axillary nodes normal. No abnormal inguinal nodes palpated Neurologic: Grossly normal   Pelvic: External genitalia:  no lesions              Urethra:  normal appearing urethra with no masses, tenderness or lesions              Bartholin's and Skene's: normal                 Vagina: normal appearing vagina with normal color and discharge, no lesions              Cervix: no bleeding following Pap, no cervical motion tenderness and no lesions              Pap taken: Yes.   Bimanual Exam:  Uterus:  normal  size, contour, position, consistency, mobility, non-tender and anteverted              Adnexa: normal adnexa and no mass, fullness, tenderness               Rectovaginal: Confirms               Anus:  normal sphincter tone, no lesions  Chaperone present: yes  A:  Well Woman with normal exam  Menopausal   History of left breast cancer on Arimidex with oncology management  Colonoscopy due  Screening labs  P:   Reviewed health and wellness pertinent to exam  Aware of need to advise if vaginal bleeding  Continue follow up with Oncology as indicated  Discussed importance of colonoscopy, will call to advise  who she wants to schedule with.   Lab: IFOB dispensed  Labs: TSH, Lipid panel, Vitamin D  Pap smear: yes   counseled on breast self exam, mammography screening, feminine hygiene, menopause, adequate intake of calcium and vitamin D, diet and exercise  return annually or prn  An After Visit Summary was printed and given to the patient.

## 2019-09-16 NOTE — Patient Instructions (Signed)
EXERCISE AND DIET:  We recommended that you start or continue a regular exercise program for good health. Regular exercise means any activity that makes your heart beat faster and makes you sweat.  We recommend exercising at least 30 minutes per day at least 3 days a week, preferably 4 or 5.  We also recommend a diet low in fat and sugar.  Inactivity, poor dietary choices and obesity can cause diabetes, heart attack, stroke, and kidney damage, among others.    ALCOHOL AND SMOKING:  Women should limit their alcohol intake to no more than 7 drinks/beers/glasses of wine (combined, not each!) per week. Moderation of alcohol intake to this level decreases your risk of breast cancer and liver damage. And of course, no recreational drugs are part of a healthy lifestyle.  And absolutely no smoking or even second hand smoke. Most people know smoking can cause heart and lung diseases, but did you know it also contributes to weakening of your bones? Aging of your skin?  Yellowing of your teeth and nails?  CALCIUM AND VITAMIN D:  Adequate intake of calcium and Vitamin D are recommended.  The recommendations for exact amounts of these supplements seem to change often, but generally speaking 600 mg of calcium (either carbonate or citrate) and 800 units of Vitamin D per day seems prudent. Certain women may benefit from higher intake of Vitamin D.  If you are among these women, your doctor will have told you during your visit.    PAP SMEARS:  Pap smears, to check for cervical cancer or precancers,  have traditionally been done yearly, although recent scientific advances have shown that most women can have pap smears less often.  However, every woman still should have a physical exam from her gynecologist every year. It will include a breast check, inspection of the vulva and vagina to check for abnormal growths or skin changes, a visual exam of the cervix, and then an exam to evaluate the size and shape of the uterus and  ovaries.  And after 52 years of age, a rectal exam is indicated to check for rectal cancers. We will also provide age appropriate advice regarding health maintenance, like when you should have certain vaccines, screening for sexually transmitted diseases, bone density testing, colonoscopy, mammograms, etc.   MAMMOGRAMS:  All women over 40 years old should have a yearly mammogram. Many facilities now offer a "3D" mammogram, which may cost around $50 extra out of pocket. If possible,  we recommend you accept the option to have the 3D mammogram performed.  It both reduces the number of women who will be called back for extra views which then turn out to be normal, and it is better than the routine mammogram at detecting truly abnormal areas.    COLONOSCOPY:  Colonoscopy to screen for colon cancer is recommended for all women at age 50.  We know, you hate the idea of the prep.  We agree, BUT, having colon cancer and not knowing it is worse!!  Colon cancer so often starts as a polyp that can be seen and removed at colonscopy, which can quite literally save your life!  And if your first colonoscopy is normal and you have no family history of colon cancer, most women don't have to have it again for 10 years.  Once every ten years, you can do something that may end up saving your life, right?  We will be happy to help you get it scheduled when you are ready.    Be sure to check your insurance coverage so you understand how much it will cost.  It may be covered as a preventative service at no cost, but you should check your particular policy.      Menopause Menopause is the normal time of life when menstrual periods stop completely. It is usually confirmed by 12 months without a menstrual period. The transition to menopause (perimenopause) most often happens between the ages of 29 and 78. During perimenopause, hormone levels change in your body, which can cause symptoms and affect your health. Menopause may increase  your risk for:  Loss of bone (osteoporosis), which causes bone breaks (fractures).  Depression.  Hardening and narrowing of the arteries (atherosclerosis), which can cause heart attacks and strokes. What are the causes? This condition is usually caused by a natural change in hormone levels that happens as you get older. The condition may also be caused by surgery to remove both ovaries (bilateral oophorectomy). What increases the risk? This condition is more likely to start at an earlier age if you have certain medical conditions or treatments, including:  A tumor of the pituitary gland in the brain.  A disease that affects the ovaries and hormone production.  Radiation treatment for cancer.  Certain cancer treatments, such as chemotherapy or hormone (anti-estrogen) therapy.  Heavy smoking and excessive alcohol use.  Family history of early menopause. This condition is also more likely to develop earlier in women who are very thin. What are the signs or symptoms? Symptoms of this condition include:  Hot flashes.  Irregular menstrual periods.  Night sweats.  Changes in feelings about sex. This could be a decrease in sex drive or an increased comfort around your sexuality.  Vaginal dryness and thinning of the vaginal walls. This may cause painful intercourse.  Dryness of the skin and development of wrinkles.  Headaches.  Problems sleeping (insomnia).  Mood swings or irritability.  Memory problems.  Weight gain.  Hair growth on the face and chest.  Bladder infections or problems with urinating. How is this diagnosed? This condition is diagnosed based on your medical history, a physical exam, your age, your menstrual history, and your symptoms. Hormone tests may also be done. How is this treated? In some cases, no treatment is needed. You and your health care provider should make a decision together about whether treatment is necessary. Treatment will be based on  your individual condition and preferences. Treatment for this condition focuses on managing symptoms. Treatment may include:  Menopausal hormone therapy (MHT).  Medicines to treat specific symptoms or complications.  Acupuncture.  Vitamin or herbal supplements. Before starting treatment, make sure to let your health care provider know if you have a personal or family history of:  Heart disease.  Breast cancer.  Blood clots.  Diabetes.  Osteoporosis. Follow these instructions at home: Lifestyle  Do not use any products that contain nicotine or tobacco, such as cigarettes and e-cigarettes. If you need help quitting, ask your health care provider.  Get at least 30 minutes of physical activity on 5 or more days each week.  Avoid alcoholic and caffeinated beverages, as well as spicy foods. This may help prevent hot flashes.  Get 7-8 hours of sleep each night.  If you have hot flashes, try: ? Dressing in layers. ? Avoiding things that may trigger hot flashes, such as spicy food, warm places, or stress. ? Taking slow, deep breaths when a hot flash starts. ? Keeping a fan in your home and  office.  Find ways to manage stress, such as deep breathing, meditation, or journaling.  Consider going to group therapy with other women who are having menopause symptoms. Ask your health care provider about recommended group therapy meetings. Eating and drinking  Eat a healthy, balanced diet that contains whole grains, lean protein, low-fat dairy, and plenty of fruits and vegetables.  Your health care provider may recommend adding more soy to your diet. Foods that contain soy include tofu, tempeh, and soy milk.  Eat plenty of foods that contain calcium and vitamin D for bone health. Items that are rich in calcium include low-fat milk, yogurt, beans, almonds, sardines, broccoli, and kale. Medicines  Take over-the-counter and prescription medicines only as told by your health care  provider.  Talk with your health care provider before starting any herbal supplements. If prescribed, take vitamins and supplements as told by your health care provider. These may include: ? Calcium. Women age 84 and older should get 1,200 mg (milligrams) of calcium every day. ? Vitamin D. Women need 600-800 International Units of vitamin D each day. ? Vitamins B12 and B6. Aim for 50 micrograms of B12 and 1.5 mg of B6 each day. General instructions  Keep track of your menstrual periods, including: ? When they occur. ? How heavy they are and how long they last. ? How much time passes between periods.  Keep track of your symptoms, noting when they start, how often you have them, and how long they last.  Use vaginal lubricants or moisturizers to help with vaginal dryness and improve comfort during sex.  Keep all follow-up visits as told by your health care provider. This is important. This includes any group therapy or counseling. Contact a health care provider if:  You are still having menstrual periods after age 19.  You have pain during sex.  You have not had a period for 12 months and you develop vaginal bleeding. Get help right away if:  You have: ? Severe depression. ? Excessive vaginal bleeding. ? Pain when you urinate. ? A fast or irregular heart beat (palpitations). ? Severe headaches. ? Abdomen (abdominal) pain or severe indigestion.  You fell and you think you have a broken bone.  You develop leg or chest pain.  You develop vision problems.  You feel a lump in your breast. Summary  Menopause is the normal time of life when menstrual periods stop completely. It is usually confirmed by 12 months without a menstrual period.  The transition to menopause (perimenopause) most often happens between the ages of 52 and 53.  Symptoms can be managed through medicines, lifestyle changes, and complementary therapies such as acupuncture.  Eat a balanced diet that is rich  in nutrients to promote bone health and heart health and to manage symptoms during menopause. This information is not intended to replace advice given to you by your health care provider. Make sure you discuss any questions you have with your health care provider. Document Revised: 08/07/2017 Document Reviewed: 09/27/2016 Elsevier Patient Education  2020 [employer].

## 2019-09-17 LAB — LIPID PANEL
Chol/HDL Ratio: 4 ratio (ref 0.0–4.4)
Cholesterol, Total: 194 mg/dL (ref 100–199)
HDL: 49 mg/dL (ref >39)
LDL Chol Calc (NIH): 128 mg/dL — ABNORMAL HIGH (ref 0–99)
Triglycerides: 94 mg/dL (ref 0–149)
VLDL Cholesterol Cal: 17 mg/dL (ref 5–40)

## 2019-09-17 LAB — VITAMIN D 25 HYDROXY (VIT D DEFICIENCY, FRACTURES): Vit D, 25-Hydroxy: 25.3 ng/mL — ABNORMAL LOW (ref 30.0–100.0)

## 2019-09-17 LAB — TSH: TSH: 1.51 u[IU]/mL (ref 0.450–4.500)

## 2019-09-19 LAB — CYTOLOGY - PAP: Diagnosis: NEGATIVE

## 2019-09-23 LAB — FECAL OCCULT BLOOD, IMMUNOCHEMICAL: Fecal Occult Bld: NEGATIVE

## 2019-09-23 LAB — SPECIMEN STATUS REPORT

## 2019-10-03 ENCOUNTER — Encounter: Payer: No Typology Code available for payment source | Admitting: Adult Health

## 2019-10-06 ENCOUNTER — Other Ambulatory Visit: Payer: Self-pay

## 2019-10-06 ENCOUNTER — Ambulatory Visit
Admission: RE | Admit: 2019-10-06 | Discharge: 2019-10-06 | Disposition: A | Discharge status: Home / Self Care | Payer: No Typology Code available for payment source | Source: Ambulatory Visit | Attending: Oncology | Admitting: Oncology

## 2019-10-06 ENCOUNTER — Other Ambulatory Visit: Payer: Self-pay | Admitting: Oncology

## 2019-10-06 DIAGNOSIS — Z170 Estrogen receptor positive status [ER+]: Secondary | ICD-10-CM

## 2019-10-06 DIAGNOSIS — C50412 Malignant neoplasm of upper-outer quadrant of left female breast: Secondary | ICD-10-CM

## 2019-10-06 NOTE — Progress Notes (Signed)
Mammogram negative, Density C Repeat yearly

## 2019-11-16 ENCOUNTER — Encounter: Payer: Self-pay | Admitting: *Deleted

## 2019-11-16 DIAGNOSIS — Z170 Estrogen receptor positive status [ER+]: Secondary | ICD-10-CM

## 2019-11-16 DIAGNOSIS — C50412 Malignant neoplasm of upper-outer quadrant of left female breast: Secondary | ICD-10-CM

## 2019-11-17 ENCOUNTER — Other Ambulatory Visit: Payer: Self-pay

## 2019-11-17 ENCOUNTER — Encounter: Payer: Self-pay | Admitting: Physical Therapy

## 2019-11-17 ENCOUNTER — Ambulatory Visit: Payer: No Typology Code available for payment source | Attending: Oncology | Admitting: Physical Therapy

## 2019-11-17 DIAGNOSIS — Z170 Estrogen receptor positive status [ER+]: Secondary | ICD-10-CM | POA: Insufficient documentation

## 2019-11-17 DIAGNOSIS — C50412 Malignant neoplasm of upper-outer quadrant of left female breast: Secondary | ICD-10-CM | POA: Diagnosis present

## 2019-11-17 DIAGNOSIS — M25612 Stiffness of left shoulder, not elsewhere classified: Secondary | ICD-10-CM | POA: Insufficient documentation

## 2019-11-17 DIAGNOSIS — R293 Abnormal posture: Secondary | ICD-10-CM

## 2019-11-17 DIAGNOSIS — Z483 Aftercare following surgery for neoplasm: Secondary | ICD-10-CM | POA: Insufficient documentation

## 2019-11-17 DIAGNOSIS — I972 Postmastectomy lymphedema syndrome: Secondary | ICD-10-CM | POA: Diagnosis present

## 2019-11-17 NOTE — Therapy (Signed)
[place], [place], [address] Phone: [phone]   Fax:  [phone]  Physical Therapy Evaluation  Patient Details  Name: Jennifer Harrison MRN: [id number] Date of Birth: 08-11-1966 Referring Provider (PT): Dr. Lurline Del   Encounter Date: 11/17/2019  PT End of Session - 11/17/19 1222    Visit Number  1    Number of Visits  8    Date for PT Re-Evaluation  12/15/19    PT Start Time  0900    PT Stop Time  0958    PT Time Calculation (min)  58 min    Activity Tolerance  Patient tolerated treatment well    Behavior During Therapy  [facility] for tasks assessed/performed       Past Medical History:  Diagnosis Date  . Breast cancer ([place])   . Family history of breast cancer   . Pulmonary embolus ([place]) 2020    Past Surgical History:  Procedure Laterality Date  . BREAST BIOPSY Right 11/22/2018   atypical , negative breast cancer  . BREAST RECONSTRUCTION WITH PLACEMENT OF TISSUE EXPANDER AND ALLODERM Left 11/22/2018   Procedure: LEFT BREAST RECONSTRUCTION WITH TISSUE EXPANDER AND ACELLULAR DERMIS;  Surgeon: [name], MD;  Location: [place];  Service: Plastics;  Laterality: Left;  . BREAST REDUCTION SURGERY Right 04/26/2019   Procedure: RIGHT BREAST REDUCTION;  Surgeon: [name], MD;  Location: [place];  Service: Plastics;  Laterality: Right;  . Broken Wrist  1999  . CESAREAN SECTION    . endometrial biopsy  10/15  . LIPOSUCTION WITH LIPOFILLING Left 04/26/2019   Procedure: LIPOFILLING FROM ABDOMEN TO LEFT CHEST;  Surgeon: [name], MD;  Location: [place];  Service: Plastics;  Laterality: Left;  Marland Kitchen MASTECTOMY Left 11/22/2018   total/complete with tissue expander and implant   . MASTECTOMY W/ SENTINEL NODE BIOPSY Left 11/22/2018   Procedure: LEFT TOTAL MASTECTOMY WITH LEFT AXILLARY SENTINEL LYMPH NODE BIOPSY;  Surgeon:  Rolm Bookbinder, MD;  Location: [place];  Service: General;  Laterality: Left;  . RADIOACTIVE SEED GUIDED EXCISIONAL BREAST BIOPSY Right 11/22/2018   Procedure: RIGHT BREAST RADIOACTIVE SEED GUIDED EXCISIONAL BIOPSY;  Surgeon: Rolm Bookbinder, MD;  Location: [place];  Service: General;  Laterality: Right;  . REDUCTION MAMMAPLASTY Right 04/26/2019  . REMOVAL OF TISSUE EXPANDER AND PLACEMENT OF IMPLANT Left 04/26/2019   Procedure: REMOVAL OF LEFT TISSUE EXPANDER AND PLACEMENT OF IMPLANT;  Surgeon: [name], MD;  Location: [place];  Service: Plastics;  Laterality: Left;    There were no vitals filed for this visit.   Subjective Assessment - 11/17/19 0908    Subjective  Patient reports she began feeling some heaviness in her left arm recently and is concerned about an onset of lymphedema. She reports weight fluctuation over the past year but joined [employer] and has lost 11 pounds since 08/2019. Weight at time of diagnosis was about 10 pounds less than it is currently.    Pertinent History  Left mastectomy and sentinel node biopsy (5 negative nodes removed) on 11/22/2018. Pulmonary embolus 12/03/2018 with 5 day hospitalization stay. Reconstruction finalized on 04/26/2019 with implant placed.    Patient Stated Goals  See if my arm is doing ok    Currently in Pain?  Yes    Pain Score  4     Pain Location  Arm    Pain Orientation  Left    Pain  Descriptors / Indicators  Heaviness    Pain Type  Chronic pain    Pain Onset  More than a month ago    Pain Frequency  Intermittent    Aggravating Factors   Lifting arm    Pain Relieving Factors  Rest         [facility] PT Assessment - 11/17/19 0001      Assessment   Medical Diagnosis  Left arm edema    Referring Provider (PT)  Dr. Sarajane Jews Magrinat    Onset Date/Surgical Date  09/09/19   11/07/2018 mastectomy but heavy feeling 09/2019   Hand Dominance  Right    Prior Therapy  None recent;  previously seen in PT for post op breast CA      Precautions   Precautions  Other (comment)    Precaution Comments  left arm lymphedema risk      Restrictions   Weight Bearing Restrictions  No      Balance Screen   Has the patient fallen in the past 6 months  No    Has the patient had a decrease in activity level because of a fear of falling?   No    Is the patient reluctant to leave their home because of a fear of falling?   No      Home Film/video editor residence    Living Arrangements  Spouse/significant other    Available Help at Discharge  --   boyfriend     Prior Function   Level of Independence  Independent    Vocation  Full time employment    Engineer, agricultural office work    Leisure  She does yoga 3x/week and walks 15 minutes daily limited by plantar fasciitis      Cognition   Overall Cognitive Status  Within Functional Limits for tasks assessed      Observation/Other Assessments   Observations  Visible edema present left upper lateral back; see photo; L-Dex score -5.1      Posture/Postural Control   Posture/Postural Control  Postural limitations    Postural Limitations  Rounded Shoulders;Forward head      ROM / Strength   AROM / PROM / Strength  AROM;Strength      AROM   AROM Assessment Site  Shoulder    Right/Left Shoulder  Right;Left    Right Shoulder Extension  47 Degrees    Right Shoulder Flexion  155 Degrees    Right Shoulder ABduction  160 Degrees    Right Shoulder Internal Rotation  61 Degrees    Right Shoulder External Rotation  80 Degrees    Left Shoulder Extension  41 Degrees    Left Shoulder Flexion  140 Degrees    Left Shoulder ABduction  159 Degrees    Left Shoulder Internal Rotation  69 Degrees    Left Shoulder External Rotation  80 Degrees      Special Tests    Special Tests  Rotator Cuff Impingement    Rotator Cuff Impingment tests  Empty Can test      Empty Can test   Findings  Negative     Side  Left        LYMPHEDEMA/ONCOLOGY QUESTIONNAIRE - 11/17/19 0922      Type   Cancer Type  Left breast      Surgeries   Mastectomy Date  11/22/18    Sentinel Lymph Node Biopsy Date  11/22/18    Number Lymph Nodes  Removed  5      Treatment   Active Chemotherapy Treatment  No    Past Chemotherapy Treatment  No    Active Radiation Treatment  No    Past Radiation Treatment  No    Current Hormone Treatment  Yes    Drug Name  Anastrozole    Past Hormone Therapy  No      What other symptoms do you have   Are you Having Heaviness or Tightness  Yes    Are you having Pain  Yes    Are you having pitting edema  No    Is it Hard or Difficult finding clothes that fit  No    Do you have infections  No    Is there Decreased scar mobility  No    Stemmer Sign  No      Lymphedema Assessments   Lymphedema Assessments  Upper extremities      Right Upper Extremity Lymphedema   10 cm Proximal to Olecranon Process  36.2 cm    Olecranon Process  29.5 cm    10 cm Proximal to Ulnar Styloid Process  25.6 cm    Just Proximal to Ulnar Styloid Process  18.1 cm    Across Hand at [employer]  21.1 cm    At [place] of 2nd Digit  7 cm      Left Upper Extremity Lymphedema   10 cm Proximal to Olecranon Process  36.8 cm    Olecranon Process  28.9 cm    10 cm Proximal to Ulnar Styloid Process  26 cm    Just Proximal to Ulnar Styloid Process  17.4 cm    Across Hand at [employer]  20.4 cm    At [place] of 2nd Digit  7 cm             Objective measurements completed on examination: See above findings.              PT Education - 11/17/19 1221    Education Details  Educated pt on purchasing a compression bra to provide compression to left upper back / lat region.    Person(s) Educated  Patient    Methods  Explanation;Verbal cues    Comprehension  Verbalized understanding          PT Long Term Goals - 11/17/19 1227      PT LONG TERM GOAL #1   Title  Patient will be  educated on lymphedema risk reduction practices    Baseline  .    Time  4    Period  Weeks    Status  New    Target Date  12/15/19      PT LONG TERM GOAL #2   Title  Patient will report >/= 50% improvement in feeling of heaviness in her left upper extremity.    Time  4    Period  Weeks    Target Date  12/15/19      PT LONG TERM GOAL #3   Title  Patient or a family member will be educated on performing self manual lymph drainage or use of a lymphatic pump to redirect and promote lymphatic circulation    Time  4    Period  Weeks    Target Date  12/15/19             Plan - 11/17/19 1222    Clinical Impression Statement  Patient underwent a left mastectomy and sentinel node  biopsy 11/22/2018. She completed reconstruction 04/2019. She reports over the past few months, her left upper arm has began to feel heavy.When assessed, there was visible edema present in her left upper back (see photo). She will benefit from manual lymph drainage and use of a compression bra. Her edema may be contributing to the feeling of heaviness in her arm.    Stability/Clinical Decision Making  Stable/Uncomplicated    Clinical Decision Making  Low    Rehab Potential  Excellent    PT Frequency  2x / week    PT Duration  4 weeks    PT Treatment/Interventions  ADLs/Self Care Home Management;Manual techniques;Manual lymph drainage;Therapeutic exercise;Patient/family education;DME Instruction    PT Next Visit Plan  Begin manual lymph drainage for left arm focusing on left upper back    Consulted and Agree with Plan of Care  Patient       Patient will benefit from skilled therapeutic intervention in order to improve the following deficits and impairments:  Postural dysfunction, Impaired UE functional use, Pain, Increased edema, Decreased knowledge of use of DME  Visit Diagnosis: Abnormal posture - Plan: PT plan of care cert/re-cert  Malignant neoplasm of upper-outer quadrant of left breast in female,  estrogen receptor positive ([place]) - Plan: PT plan of care cert/re-cert  Postmastectomy lymphedema - Plan: PT plan of care cert/re-cert     Problem List Patient Active Problem List   Diagnosis Date Noted  . Acute respiratory failure with hypoxia ([place]) 12/03/2018  . Acute pulmonary embolus ([place]) 12/03/2018  . Acquired absence of left breast 11/29/2018  . Class 3 severe obesity due to excess calories without serious comorbidity with body mass index (BMI) of 40.0 to 44.9 in adult ([place]) 10/26/2018  . Malignant neoplasm of upper-outer quadrant of left breast in female, estrogen receptor positive ([place]) 10/14/2018  . Genetic testing 12/15/2017  . Family history of breast cancer   . Family history of diabetes mellitus 07/29/2013   Annia Friendly, PT 11/17/19 12:31 PM  [place], [place], [address] Phone: [phone]   Fax:  [phone]  Name: Jennifer Harrison MRN: [id number] Date of Birth: [birth year]

## 2019-11-22 ENCOUNTER — Other Ambulatory Visit: Payer: Self-pay

## 2019-11-22 ENCOUNTER — Ambulatory Visit: Payer: No Typology Code available for payment source

## 2019-11-22 DIAGNOSIS — R293 Abnormal posture: Secondary | ICD-10-CM | POA: Diagnosis not present

## 2019-11-22 DIAGNOSIS — M25612 Stiffness of left shoulder, not elsewhere classified: Secondary | ICD-10-CM

## 2019-11-22 DIAGNOSIS — Z483 Aftercare following surgery for neoplasm: Secondary | ICD-10-CM

## 2019-11-22 DIAGNOSIS — I972 Postmastectomy lymphedema syndrome: Secondary | ICD-10-CM

## 2019-11-22 DIAGNOSIS — Z170 Estrogen receptor positive status [ER+]: Secondary | ICD-10-CM

## 2019-11-22 DIAGNOSIS — C50412 Malignant neoplasm of upper-outer quadrant of left female breast: Secondary | ICD-10-CM

## 2019-11-22 NOTE — Therapy (Signed)
[employer], [place], [address] Phone: [phone]   Fax:  [phone]  Physical Therapy Treatment  Patient Details  Name: Jennifer Harrison MRN: [id number] Date of Birth: Oct 11, 1965 Referring Provider (PT): Dr. Lurline Del   Encounter Date: 11/22/2019  PT End of Session - 11/22/19 1507    Visit Number  2    Number of Visits  8    Date for PT Re-Evaluation  12/15/19    PT Start Time  1500    PT Stop Time  1554    PT Time Calculation (min)  54 min    Activity Tolerance  Patient tolerated treatment well    Behavior During Therapy  [facility] for tasks assessed/performed       Past Medical History:  Diagnosis Date  . Breast cancer ([place])   . Family history of breast cancer   . Pulmonary embolus ([place]) 2020    Past Surgical History:  Procedure Laterality Date  . BREAST BIOPSY Right 11/22/2018   atypical , negative breast cancer  . BREAST RECONSTRUCTION WITH PLACEMENT OF TISSUE EXPANDER AND ALLODERM Left 11/22/2018   Procedure: LEFT BREAST RECONSTRUCTION WITH TISSUE EXPANDER AND ACELLULAR DERMIS;  Surgeon: [name], MD;  Location: [place];  Service: Plastics;  Laterality: Left;  . BREAST REDUCTION SURGERY Right 04/26/2019   Procedure: RIGHT BREAST REDUCTION;  Surgeon: [name], MD;  Location: [place];  Service: Plastics;  Laterality: Right;  . Broken Wrist  1999  . CESAREAN SECTION    . endometrial biopsy  10/15  . LIPOSUCTION WITH LIPOFILLING Left 04/26/2019   Procedure: LIPOFILLING FROM ABDOMEN TO LEFT CHEST;  Surgeon: [name], MD;  Location: [place];  Service: Plastics;  Laterality: Left;  Marland Kitchen MASTECTOMY Left 11/22/2018   total/complete with tissue expander and implant   . MASTECTOMY W/ SENTINEL NODE BIOPSY Left 11/22/2018   Procedure: LEFT TOTAL MASTECTOMY WITH LEFT AXILLARY SENTINEL LYMPH NODE BIOPSY;  Surgeon:  Rolm Bookbinder, MD;  Location: [place];  Service: General;  Laterality: Left;  . RADIOACTIVE SEED GUIDED EXCISIONAL BREAST BIOPSY Right 11/22/2018   Procedure: RIGHT BREAST RADIOACTIVE SEED GUIDED EXCISIONAL BIOPSY;  Surgeon: Rolm Bookbinder, MD;  Location: [place];  Service: General;  Laterality: Right;  . REDUCTION MAMMAPLASTY Right 04/26/2019  . REMOVAL OF TISSUE EXPANDER AND PLACEMENT OF IMPLANT Left 04/26/2019   Procedure: REMOVAL OF LEFT TISSUE EXPANDER AND PLACEMENT OF IMPLANT;  Surgeon: [name], MD;  Location: [place];  Service: Plastics;  Laterality: Left;    There were no vitals filed for this visit.  Subjective Assessment - 11/22/19 1507    Subjective  Pt states that she feels heaviness and tightness in her LUE. When she lifts her arm it feels like someone is pushing it back down.    Pertinent History  Left mastectomy and sentinel node biopsy (5 negative nodes removed) on 11/22/2018. Pulmonary embolus 12/03/2018 with 5 day hospitalization stay. Reconstruction finalized on 04/26/2019 with implant placed.    Patient Stated Goals  See if my arm is doing ok    Currently in Pain?  Yes    Pain Score  2     Pain Location  Arm    Pain Orientation  Left    Pain Descriptors / Indicators  Heaviness    Pain Type  Chronic pain    Pain Onset  More than a month ago    Pain  Frequency  Intermittent    Aggravating Factors   lifting arm    Pain Relieving Factors  rest                       [facility] Adult PT Treatment/Exercise - 11/22/19 0001      Manual Therapy   Manual Therapy  Manual Lymphatic Drainage (MLD);Joint mobilization    Joint Mobilization  AC joint mobilization 8x8 due to pt reports pain at her [facility] joint with shoulder flexion/abduction    Manual Lymphatic Drainage (MLD)  Pt was taught self MLD in supine: 5 diaphragmatic breathes, bil axillary and L inguinal nodes, anterior inter-axillary anastomosis, L  axillo-inguinal anastomosis, lateral brachium, medial to lateral brachium, re-worked anastmosis, all surfaces of the brachium and dorsum of the hand followed by re-working all surfaces. Then physical therapist performed MLD in supine: short neck, swimming in the terminus, bil shoulders, bil axillary nodes, L inguinal nodes, anterior inter axillary and L axillo-inguinal anastomosis, lateral brachium, medial to lateral brachium, all surfaces of the antebrachium, dorsum of the hand, re-worked anastomosis, in side-lying posterior inter-axillary anastomosis and L axillo-inguinal anastomosis, back in supine re-worked anastomosis and deep abdominals.              PT Education - 11/22/19 1524    Education Details  Pt will begin trying self MLD at home. She will wear her sports bras as tolerated until she is able to get a compression bra.    Person(s) Educated  Patient    Methods  Explanation;Demonstration;Tactile cues;Verbal cues;Handout    Comprehension  Verbalized understanding;Returned demonstration          PT Long Term Goals - 11/17/19 1227      PT LONG TERM GOAL #1   Title  Patient will be educated on lymphedema risk reduction practices    Baseline  .    Time  4    Period  Weeks    Status  New    Target Date  12/15/19      PT LONG TERM GOAL #2   Title  Patient will report >/= 50% improvement in feeling of heaviness in her left upper extremity.    Time  4    Period  Weeks    Target Date  12/15/19      PT LONG TERM GOAL #3   Title  Patient or a family member will be educated on performing self manual lymph drainage or use of a lymphatic pump to redirect and promote lymphatic circulation    Time  4    Period  Weeks    Target Date  12/15/19            Plan - 11/22/19 1506    Clinical Impression Statement  Pt was taught self MLD this session with hand over hand cueing, VC and demonstration with an emphasis on skin stretch, direction and pressure .Pt had good return  demonstration performing entire sequence following physical therapist education and intermittent instruction. MLD was performed by the physical therapist for the LUE with extra time spent at the posterior inter-axillary anastomosis. Joint mobs at the L [facility] joint due to pt reporting pain with any L shoulder movement; improved pain'free ROM following AC joint mobs. Pt reports that her arm feels better at end of session. Pt will benefit from continued POC.    Rehab Potential  Excellent    PT Frequency  2x / week    PT Duration  4 weeks  PT Treatment/Interventions  ADLs/Self Care Home Management;Manual techniques;Manual lymph drainage;Therapeutic exercise;Patient/family education;DME Instruction    PT Next Visit Plan  continue MLD ask if pt has questions regarding self MLD, manual lymph drainage for left arm focusing on left upper back, Shoulder ROM/exercises    Consulted and Agree with Plan of Care  Patient       Patient will benefit from skilled therapeutic intervention in order to improve the following deficits and impairments:  Postural dysfunction, Impaired UE functional use, Pain, Increased edema, Decreased knowledge of use of DME  Visit Diagnosis: Abnormal posture  Malignant neoplasm of upper-outer quadrant of left breast in female, estrogen receptor positive ([place])  Postmastectomy lymphedema  Aftercare following surgery for neoplasm  Stiffness of left shoulder, not elsewhere classified     Problem List Patient Active Problem List   Diagnosis Date Noted  . Acute respiratory failure with hypoxia ([place]) 12/03/2018  . Acute pulmonary embolus ([place]) 12/03/2018  . Acquired absence of left breast 11/29/2018  . Class 3 severe obesity due to excess calories without serious comorbidity with body mass index (BMI) of 40.0 to 44.9 in adult ([place]) 10/26/2018  . Malignant neoplasm of upper-outer quadrant of left breast in female, estrogen receptor positive ([place]) 10/14/2018  . Genetic testing  12/15/2017  . Family history of breast cancer   . Family history of diabetes mellitus 07/29/2013    Ander Purpura, PT 11/22/2019, 4:02 PM  [place], [place], [address] Phone: [phone]   Fax:  [phone]  Name: Jennifer Harrison MRN: [id number] Date of Birth: August 11, 1966

## 2019-11-22 NOTE — Patient Instructions (Signed)
Deep Effective Breath   Standing, sitting, or laying down, place both hands on the belly. Take a deep breath IN, expanding the belly; then breath OUT, contracting the belly. Repeat __5__ times. Do __2-3__ sessions per day and before your self massage.  http://gt2.exer.us/866   Copyright  VHI. All rights reserved.  Axilla to Axilla - Sweep   On both sides make 5 circles in the armpit, then pump _5__ times from involved armpit across chest to uninvolved armpit, making a pathway. Do _1__ time per day.  Copyright  VHI. All rights reserved.  Axilla to Inguinal Nodes - Sweep   On involved side, make 5 circles at groin at panty line, then pump _5__ times from armpit along side of trunk to outer hip, making your other pathway. Do __1_ time per day.  Copyright  VHI. All rights reserved.  Arm Posterior: Elbow to Shoulder - Sweep   Pump _5__ times from back of elbow to top of shoulder. Then inner to outer upper arm _5_ times, then outer arm again _5_ times. Then back to the pathways _2-3_ times. Do _1__ time per day.  Copyright  VHI. All rights reserved.  ARM: [place] Wrist to Elbow - Sweep   Pump or stationary circles _5__ times from wrist to elbow making sure to do both sides of the forearm. Then retrace your steps to the outer arm, and the pathways _2-3_ times each. Do _1__ time per day.  Copyright  VHI. All rights reserved.  ARM: Dorsum of Hand to Shoulder - Sweep   Pump or stationary circles _5__ times on back of hand including knuckle spaces and individual fingers if needed working up towards the wrist, then retrace all your steps working back up the forearm, doing both sides; upper outer arm and back to your pathways _2-3_ times each. Then do 5 circles again at uninvolved armpit and involved groin where you started! Good job!! Do __1_ time per day.  Copyright  VHI. All rights reserved.

## 2019-11-23 ENCOUNTER — Ambulatory Visit
Admission: RE | Admit: 2019-11-23 | Discharge: 2019-11-23 | Disposition: A | Discharge status: Home / Self Care | Payer: No Typology Code available for payment source | Source: Ambulatory Visit | Attending: Oncology | Admitting: Oncology

## 2019-11-23 DIAGNOSIS — Z170 Estrogen receptor positive status [ER+]: Secondary | ICD-10-CM

## 2019-11-23 DIAGNOSIS — C50412 Malignant neoplasm of upper-outer quadrant of left female breast: Secondary | ICD-10-CM

## 2019-11-28 ENCOUNTER — Encounter: Payer: Self-pay | Admitting: Certified Nurse Midwife

## 2019-11-29 ENCOUNTER — Ambulatory Visit: Payer: No Typology Code available for payment source

## 2019-11-29 ENCOUNTER — Other Ambulatory Visit: Payer: Self-pay

## 2019-11-29 DIAGNOSIS — R293 Abnormal posture: Secondary | ICD-10-CM

## 2019-11-29 DIAGNOSIS — C50412 Malignant neoplasm of upper-outer quadrant of left female breast: Secondary | ICD-10-CM

## 2019-11-29 DIAGNOSIS — Z483 Aftercare following surgery for neoplasm: Secondary | ICD-10-CM

## 2019-11-29 DIAGNOSIS — Z170 Estrogen receptor positive status [ER+]: Secondary | ICD-10-CM

## 2019-11-29 DIAGNOSIS — I972 Postmastectomy lymphedema syndrome: Secondary | ICD-10-CM

## 2019-11-29 DIAGNOSIS — M25612 Stiffness of left shoulder, not elsewhere classified: Secondary | ICD-10-CM

## 2019-11-29 NOTE — Therapy (Addendum)
[place], [place], [address] Phone: [phone]   Fax:  [phone]  Physical Therapy Treatment  Patient Details  Name: Jennifer Harrison MRN: [id number] Date of Birth: [birth year] Referring Provider (PT): Dr. Lurline Del   Encounter Date: 11/29/2019  PT End of Session - 11/29/19 1515    Visit Number  3    Number of Visits  8    Date for PT Re-Evaluation  12/15/19    PT Start Time  6962    PT Stop Time  1608    PT Time Calculation (min)  53 min    Activity Tolerance  Patient tolerated treatment well    Behavior During Therapy  [facility] for tasks assessed/performed       Past Medical History:  Diagnosis Date  . Breast cancer ([place])   . Family history of breast cancer   . Pulmonary embolus ([place]) 2020    Past Surgical History:  Procedure Laterality Date  . BREAST BIOPSY Right 11/22/2018   atypical , negative breast cancer  . BREAST RECONSTRUCTION WITH PLACEMENT OF TISSUE EXPANDER AND ALLODERM Left 11/22/2018   Procedure: LEFT BREAST RECONSTRUCTION WITH TISSUE EXPANDER AND ACELLULAR DERMIS;  Surgeon: [name], MD;  Location: [place];  Service: Plastics;  Laterality: Left;  . BREAST REDUCTION SURGERY Right 04/26/2019   Procedure: RIGHT BREAST REDUCTION;  Surgeon: [name], MD;  Location: [place];  Service: Plastics;  Laterality: Right;  . Broken Wrist  1999  . CESAREAN SECTION    . endometrial biopsy  10/15  . LIPOSUCTION WITH LIPOFILLING Left 04/26/2019   Procedure: LIPOFILLING FROM ABDOMEN TO LEFT CHEST;  Surgeon: [name], MD;  Location: [place];  Service: Plastics;  Laterality: Left;  Marland Kitchen MASTECTOMY Left 11/22/2018   total/complete with tissue expander and implant   . MASTECTOMY W/ SENTINEL NODE BIOPSY Left 11/22/2018   Procedure: LEFT TOTAL MASTECTOMY WITH LEFT AXILLARY SENTINEL LYMPH NODE BIOPSY;  Surgeon:  Rolm Bookbinder, MD;  Location: [place];  Service: General;  Laterality: Left;  . RADIOACTIVE SEED GUIDED EXCISIONAL BREAST BIOPSY Right 11/22/2018   Procedure: RIGHT BREAST RADIOACTIVE SEED GUIDED EXCISIONAL BIOPSY;  Surgeon: Rolm Bookbinder, MD;  Location: [place];  Service: General;  Laterality: Right;  . REDUCTION MAMMAPLASTY Right 04/26/2019  . REMOVAL OF TISSUE EXPANDER AND PLACEMENT OF IMPLANT Left 04/26/2019   Procedure: REMOVAL OF LEFT TISSUE EXPANDER AND PLACEMENT OF IMPLANT;  Surgeon: [name], MD;  Location: [place];  Service: Plastics;  Laterality: Left;    There were no vitals filed for this visit.  Subjective Assessment - 11/29/19 1515    Subjective  Pt states that she has been performing her MLD at home and feels confident performing it every night. She reports that she is feeling better after her last session.    Pertinent History  Left mastectomy and sentinel node biopsy (5 negative nodes removed) on 11/22/2018. Pulmonary embolus 12/03/2018 with 5 day hospitalization stay. Reconstruction finalized on 04/26/2019 with implant placed.    Patient Stated Goals  See if my arm is doing ok    Currently in Pain?  Yes    Pain Score  1     Pain Location  Arm    Pain Orientation  Left    Pain Descriptors / Indicators  Aching    Pain Onset  More than a month ago    Pain Frequency  Intermittent  Aggravating Factors   lifting arm    Pain Relieving Factors  rest                       [facility] Adult PT Treatment/Exercise - 11/29/19 0001      Modalities   Modalities  Moist Heat      Moist Heat Therapy   Number Minutes Moist Heat  10 Minutes   during lymphatic massage   Moist Heat Location  Other (comment)   L axilla/breat and medial brachium     Manual Therapy   Manual Therapy  Manual Lymphatic Drainage (MLD);Joint mobilization;Myofascial release;Soft tissue mobilization    Joint Mobilization  AC  joint mobilization 8x8 due to pt reports pain at her [facility] joint with shoulder flexion/abduction    Soft tissue mobilization  TpR/light STM to the wing of the L latissimus dorsi, middle serratus anterior; slight decrease in tightness/tenderness following STM.     Myofascial Release  along the L lateral trunk wall to axilla, along superior breast to the anterior deltoid and alont the axilla to the distal medial brachium, cording noted in the trunk; pt reports decreased pain with abduction following myofascial release.     Manual Lymphatic Drainage (MLD)  In supine: short neck, swimming in the terminus, bil shoulders, bil axillary nodes, L inguinal nodes, anterior inter axillary and L axillo-inguinal anastomosis, lateral brachium, medial to lateral brachium, all surfaces of the antebrachium, dorsum of the hand, re-worked anastomosis, in side-lying posterior inter-axillary anastomosis and L axillo-inguinal anastomosis, back in supine re-worked anastomosis and deep abdominals.              PT Education - 11/29/19 1726    Education Details  Pt will continue with self MLD at home she will go to [facility] and get a compression bra. She will try to do easy myofascial release at home. If she uses heat she will only use very mild heat including towel from the dryer or bathwater temperature wet cloth.    Person(s) Educated  Patient    Methods  Explanation    Comprehension  Verbalized understanding          PT Long Term Goals - 11/17/19 1227      PT LONG TERM GOAL #1   Title  Patient will be educated on lymphedema risk reduction practices    Baseline  .    Time  4    Period  Weeks    Status  New    Target Date  12/15/19      PT LONG TERM GOAL #2   Title  Patient will report >/= 50% improvement in feeling of heaviness in her left upper extremity.    Time  4    Period  Weeks    Target Date  12/15/19      PT LONG TERM GOAL #3   Title  Patient or a family member will be educated on performing self  manual lymph drainage or use of a lymphatic pump to redirect and promote lymphatic circulation    Time  4    Period  Weeks    Target Date  12/15/19            Plan - 11/29/19 1514    Clinical Impression Statement  MLD was performed on patient in supine prior to STM. Palpable tightness/tenderness noted at the L latissimus dorsi and serratus; decreased mildy following manual therapy. Joint mobilizations were done Grade III to the [facility] joint posterior  with the acromion on the clavicle in order to decrease stiffness. Myofascial relelase was done along the L lateral trunk wall, axilla and anterior chest wall with good result including improved pain-free movement following physical therapy session. Pt will continue with MLD at home and apply mild heat as discussed in education section at home. She will continue to perform MLD and get a compression bra. Pt will work at home and will return to physical therapy if she feels that she is unable to make progress.    Rehab Potential  Excellent    PT Frequency  2x / week    PT Duration  4 weeks    PT Treatment/Interventions  ADLs/Self Care Home Management;Manual techniques;Manual lymph drainage;Therapeutic exercise;Patient/family education;DME Instruction    PT Next Visit Plan  continue MLD ask if pt has questions regarding self MLD, manual lymph drainage for left arm focusing on left upper back, Shoulder ROM/exercises    Consulted and Agree with Plan of Care  Patient       Patient will benefit from skilled therapeutic intervention in order to improve the following deficits and impairments:  Postural dysfunction, Impaired UE functional use, Pain, Increased edema, Decreased knowledge of use of DME  Visit Diagnosis: Abnormal posture  Malignant neoplasm of upper-outer quadrant of left breast in female, estrogen receptor positive ([place])  Postmastectomy lymphedema  Aftercare following surgery for neoplasm  Stiffness of left shoulder, not elsewhere  classified     Problem List Patient Active Problem List   Diagnosis Date Noted  . Acute respiratory failure with hypoxia ([place]) 12/03/2018  . Acute pulmonary embolus ([place]) 12/03/2018  . Acquired absence of left breast 11/29/2018  . Class 3 severe obesity due to excess calories without serious comorbidity with body mass index (BMI) of 40.0 to 44.9 in adult ([place]) 10/26/2018  . Malignant neoplasm of upper-outer quadrant of left breast in female, estrogen receptor positive ([place]) 10/14/2018  . Genetic testing 12/15/2017  . Family history of breast cancer   . Family history of diabetes mellitus 07/29/2013   PHYSICAL THERAPY DISCHARGE SUMMARY    Plan:                                                    Patient goals were not met. Patient is being discharged due to not returning since the last visit.  ?????       Ander Purpura, PT 11/29/2019, 5:30 PM  [place], [place], [address] Phone: [phone]   Fax:  [phone]  Name: Kaley Jutras MRN: [id number] Date of Birth: 1966/03/16

## 2019-12-22 ENCOUNTER — Ambulatory Visit: Payer: No Typology Code available for payment source | Admitting: Dermatology

## 2019-12-22 ENCOUNTER — Other Ambulatory Visit: Payer: Self-pay

## 2019-12-22 DIAGNOSIS — C4491 Basal cell carcinoma of skin, unspecified: Secondary | ICD-10-CM

## 2019-12-22 DIAGNOSIS — C44311 Basal cell carcinoma of skin of nose: Secondary | ICD-10-CM | POA: Diagnosis not present

## 2019-12-22 DIAGNOSIS — D492 Neoplasm of unspecified behavior of bone, soft tissue, and skin: Secondary | ICD-10-CM

## 2019-12-22 DIAGNOSIS — L578 Other skin changes due to chronic exposure to nonionizing radiation: Secondary | ICD-10-CM | POA: Diagnosis not present

## 2019-12-22 HISTORY — DX: Basal cell carcinoma of skin, unspecified: C44.91

## 2019-12-22 NOTE — Progress Notes (Signed)
   Follow-Up Visit   Subjective  Jennifer Harrison is a 54 y.o. female who presents for the following: lesion (spot on the L side ) for 1 month, bleeding, not going away.  There is significant history of sun damage changes.  The following portions of the chart were reviewed this encounter and updated as appropriate: Tobacco  Allergies  Meds  Problems  Med Hx  Surg Hx  Fam Hx      Review of Systems: No other skin or systemic complaints.  Objective  Well appearing patient in no apparent distress; mood and affect are within normal limits.  A focused examination was performed including face,nose . Relevant physical exam findings are noted in the Assessment and Plan.  Objective  L ant nasal ala crease: 0.6 cm Pearly papule   Assessment & Plan    Actinic Damage - diffuse scaly erythematous macules with underlying dyspigmentation - Recommend daily broad spectrum sunscreen SPF 30+ to sun-exposed areas, reapply every 2 hours as needed.  - Call for new or changing lesions.  Neoplasm of skin L ant nasal ala crease  Epidermal / dermal shaving  Lesion length (cm):  0.6 Lesion width (cm):  0.6 Margin per side (cm):  0.2 Total excision diameter (cm):  1 Informed consent: discussed and consent obtained   Timeout: patient name, date of birth, surgical site, and procedure verified   Procedure prep:  Patient was prepped and draped in usual sterile fashion Prep type:  Isopropyl alcohol Anesthesia: the lesion was anesthetized in a standard fashion   Anesthetic:  1% lidocaine w/ epinephrine 1-100,000 buffered w/ 8.4% NaHCO3 Hemostasis achieved with: pressure, aluminum chloride and electrodesiccation   Outcome: patient tolerated procedure well   Post-procedure details: sterile dressing applied and wound care instructions given   Dressing type: bandage and petrolatum    Destruction of lesion Complexity: extensive   Destruction method: electrodesiccation and curettage     Informed consent: discussed and consent obtained   Timeout:  patient name, date of birth, surgical site, and procedure verified Procedure prep:  Patient was prepped and draped in usual sterile fashion Prep type:  Isopropyl alcohol Anesthesia: the lesion was anesthetized in a standard fashion   Anesthetic:  1% lidocaine w/ epinephrine 1-100,000 buffered w/ 8.4% NaHCO3 Curettage performed in three different directions: Yes   Electrodesiccation performed over the curetted area: Yes   Lesion length (cm):  0.6 Lesion width (cm):  0.6 Margin per side (cm):  0.2 Final wound size (cm):  1 Hemostasis achieved with:  pressure, aluminum chloride and electrodesiccation Outcome: patient tolerated procedure well with no complications   Post-procedure details: sterile dressing applied and wound care instructions given   Dressing type: bandage and petrolatum    Specimen 1 - Surgical pathology Differential Diagnosis: R/O BCC  Check Margins: No 0.6 cm Pearly papule  Return in about 3 months (around 03/22/2020) for TBSE .  IMarye Round, CMA, am acting as scribe for Sarina Ser, MD .  Documentation: I have reviewed the above documentation for accuracy and completeness, and I agree with the above.  Sarina Ser, MD

## 2019-12-22 NOTE — Patient Instructions (Signed)

## 2019-12-26 ENCOUNTER — Encounter: Payer: Self-pay | Admitting: Dermatology

## 2019-12-27 ENCOUNTER — Telehealth: Payer: Self-pay

## 2019-12-27 NOTE — Telephone Encounter (Signed)
Discussed biopsy results with pt  °

## 2019-12-27 NOTE — Telephone Encounter (Signed)
error 

## 2019-12-27 NOTE — Telephone Encounter (Signed)
Left message please call here.

## 2020-01-16 ENCOUNTER — Other Ambulatory Visit: Payer: Self-pay | Admitting: Plastic Surgery

## 2020-01-16 DIAGNOSIS — N6320 Unspecified lump in the left breast, unspecified quadrant: Secondary | ICD-10-CM

## 2020-01-24 ENCOUNTER — Other Ambulatory Visit: Payer: Self-pay

## 2020-01-24 ENCOUNTER — Ambulatory Visit
Admission: RE | Admit: 2020-01-24 | Discharge: 2020-01-24 | Disposition: A | Discharge status: Home / Self Care | Payer: No Typology Code available for payment source | Source: Ambulatory Visit | Attending: Plastic Surgery | Admitting: Plastic Surgery

## 2020-01-24 DIAGNOSIS — N6320 Unspecified lump in the left breast, unspecified quadrant: Secondary | ICD-10-CM

## 2020-02-20 ENCOUNTER — Inpatient Hospital Stay: Payer: No Typology Code available for payment source | Attending: Oncology

## 2020-02-20 ENCOUNTER — Ambulatory Visit: Payer: Self-pay | Admitting: Physical Therapy

## 2020-02-20 ENCOUNTER — Other Ambulatory Visit: Payer: Self-pay

## 2020-02-20 DIAGNOSIS — Z9012 Acquired absence of left breast and nipple: Secondary | ICD-10-CM | POA: Insufficient documentation

## 2020-02-20 DIAGNOSIS — Z807 Family history of other malignant neoplasms of lymphoid, hematopoietic and related tissues: Secondary | ICD-10-CM | POA: Diagnosis not present

## 2020-02-20 DIAGNOSIS — Z87891 Personal history of nicotine dependence: Secondary | ICD-10-CM | POA: Diagnosis not present

## 2020-02-20 DIAGNOSIS — I2699 Other pulmonary embolism without acute cor pulmonale: Secondary | ICD-10-CM

## 2020-02-20 DIAGNOSIS — Z8249 Family history of ischemic heart disease and other diseases of the circulatory system: Secondary | ICD-10-CM | POA: Diagnosis not present

## 2020-02-20 DIAGNOSIS — C50412 Malignant neoplasm of upper-outer quadrant of left female breast: Secondary | ICD-10-CM | POA: Diagnosis not present

## 2020-02-20 DIAGNOSIS — Z79811 Long term (current) use of aromatase inhibitors: Secondary | ICD-10-CM | POA: Insufficient documentation

## 2020-02-20 DIAGNOSIS — Z170 Estrogen receptor positive status [ER+]: Secondary | ICD-10-CM | POA: Diagnosis not present

## 2020-02-20 DIAGNOSIS — Z833 Family history of diabetes mellitus: Secondary | ICD-10-CM | POA: Diagnosis not present

## 2020-02-20 DIAGNOSIS — Z86711 Personal history of pulmonary embolism: Secondary | ICD-10-CM | POA: Diagnosis not present

## 2020-02-20 DIAGNOSIS — Z803 Family history of malignant neoplasm of breast: Secondary | ICD-10-CM | POA: Diagnosis not present

## 2020-02-20 DIAGNOSIS — Z79899 Other long term (current) drug therapy: Secondary | ICD-10-CM | POA: Insufficient documentation

## 2020-02-20 LAB — CBC WITH DIFFERENTIAL/PLATELET
Abs Immature Granulocytes: 0.03 10*3/uL (ref 0.00–0.07)
Basophils Absolute: 0.1 10*3/uL (ref 0.0–0.1)
Basophils Relative: 1 %
Eosinophils Absolute: 0.2 10*3/uL (ref 0.0–0.5)
Eosinophils Relative: 2 %
HCT: 35.4 % — ABNORMAL LOW (ref 36.0–46.0)
Hemoglobin: 11.8 g/dL — ABNORMAL LOW (ref 12.0–15.0)
Immature Granulocytes: 0 %
Lymphocytes Relative: 19 %
Lymphs Abs: 1.5 10*3/uL (ref 0.7–4.0)
MCH: 29.3 pg (ref 26.0–34.0)
MCHC: 33.3 g/dL (ref 30.0–36.0)
MCV: 87.8 fL (ref 80.0–100.0)
Monocytes Absolute: 0.7 10*3/uL (ref 0.1–1.0)
Monocytes Relative: 9 %
Neutro Abs: 5.8 10*3/uL (ref 1.7–7.7)
Neutrophils Relative %: 69 %
Platelets: 333 10*3/uL (ref 150–400)
RBC: 4.03 MIL/uL (ref 3.87–5.11)
RDW: 11.9 % (ref 11.5–15.5)
WBC: 8.3 10*3/uL (ref 4.0–10.5)
nRBC: 0 % (ref 0.0–0.2)

## 2020-02-20 LAB — COMPREHENSIVE METABOLIC PANEL
ALT: 46 U/L — ABNORMAL HIGH (ref 0–44)
AST: 25 U/L (ref 15–41)
Albumin: 4.3 g/dL (ref 3.5–5.0)
Alkaline Phosphatase: 78 U/L (ref 38–126)
Anion gap: 10 (ref 5–15)
BUN: 12 mg/dL (ref 6–20)
CO2: 30 mmol/L (ref 22–32)
Calcium: 9.8 mg/dL (ref 8.9–10.3)
Chloride: 104 mmol/L (ref 98–111)
Creatinine, Ser: 0.77 mg/dL (ref 0.44–1.00)
GFR calc Af Amer: >60 mL/min (ref >60)
GFR calc non Af Amer: >60 mL/min (ref >60)
Glucose, Bld: 90 mg/dL (ref 70–99)
Potassium: 4.4 mmol/L (ref 3.5–5.1)
Sodium: 144 mmol/L (ref 135–145)
Total Bilirubin: 0.2 mg/dL — ABNORMAL LOW (ref 0.3–1.2)
Total Protein: 7.4 g/dL (ref 6.5–8.1)

## 2020-02-21 LAB — FOLLICLE STIMULATING HORMONE: FSH: 76.6 m[IU]/mL

## 2020-02-26 NOTE — Progress Notes (Signed)
[place]  [phone] [phone]) [phone]    ID: Jennifer Harrison DOB: [birth year]  MR#: [id number]  QMG#:[id number]  Patient Care Team: Patient, [name] as PCP - General (General Practice) Rolm Bookbinder, MD as Consulting Physician (General Surgery) Zenith Lamphier, Virgie Dad, MD as Consulting Physician (Oncology) Eppie Gibson, MD as Attending Physician (Radiation Oncology) Rockwell Germany, RN as Oncology Nurse Navigator Mauro Kaufmann, RN as Oncology Nurse Navigator Regina Eck, CNM as Referring Physician (Certified Nurse Midwife) [name], MD as Consulting Physician (Plastic Surgery) OTHER MD:   CHIEF COMPLAINT: Estrogen receptor positive breast cancer (s/p left mastectomy)  CURRENT TREATMENT: Anastrozole   INTERVAL HISTORY: Jennifer Harrison returns today for follow-up of her estrogen receptor positive breast cancer.  She was started on anastrozole on 04/09/2019.  She is tolerating this remarkably well.  She has had no problems with hot flashes vaginal dryness, arthralgias or myalgias.  She obtains it under good price.  Since her last visit, she underwent right diagnostic mammography with tomography at [facility] on 10/06/2019 showing: breast density category C; no evidence of malignancy.  She also underwent bone density screening on 11/23/2019 showing a T-score of +0.6, which is considered normal.  She presented to Dr. Iran Planas with a new palpable upper-outer left breast lump. She proceeded to left breast ultrasound on 01/24/2020 showing: benign cysts/oil cysts in upper-outer left breast, the largest of which corresponds to patient's palpable abnormality.   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  She deals with stress by going outside--[place] farm, grows flowers, and greatly enjoys taking walks.  She has pain in her knees, and she was taking quite a bit of Bayer aspirin for  that.  She wonders if that is the reason her liver functions are slightly off.  She also wonders if the anastrozole could be affecting the knees.  She tells me one of her aunts was on anastrozole and had severe fatigue and joint pain problems.  Aside from these issues a detailed review of systems today was stable   HISTORY OF CURRENT ILLNESS: From the original intake note:  Jennifer Harrison underwent one-year follow up bilateral diagnostic mammography with tomography for benign right breast calcifications at [facility] on 10/04/2018 showing: Breast Density Category B. There are new calcifications in the upper outer and retroareolar left breast.. Magnification views confirm pleomorphic calcifications spanning approximately 10 cm anterior to posterior diameter, by 8 cm craniocaudal span by approximately 7 cm transverse diameter. While the majority of the calcifications are in the upper-outer quadrant, there are also suspicious calcifications in the central and medial retroareolar left breast, upper inner quadrant. No suspicious mass or distortion is identified in the left breast.   Accordingly on 10/08/2018 she proceeded to biopsies of the left breast areas in question. 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 Prognostic indicators significant for: estrogen receptor, 50% positive with moderate staining intensity and progesterone receptor, 50% positive with moderate-weak staining intensity. Proliferation marker Ki67 at <1%. HER2 negative (1+) by immunohistochemistry.   She underwent an additional left breast biopsy on the same day. The pathology from this retroareolar upper outer quadrant procedure showed ([id number]): ductal carcinoma in situ with calcifications, intermediate grade.  Prognostic panel was not obtained on this tissue.  Then, she underwent an ultrasound of bilateral  axillae on 10/14/2018 showing no  abnormalities within either axilla. No abnormal lymph nodes are identified.   Magnification views of the right breast show mammographically stable predominately punctate calcifications in the upper inner quadrant, a few of which demonstrate layering. There are additional scattered calcifications in the inner inner right breast. No suspicious mass or architectural distortion on the right.    Finally, she underwent a biopsy of the right breast area in question on 10/14/2018. The pathology from this procedure showed ([id number]): atypical ductal hyperplasia with calcifications, lobular neoplasia (atypical lobular hyperplasia), and fibrocystic changes with calcifications.   The patient's subsequent history is as detailed below.   PAST MEDICAL HISTORY: Past Medical History:  Diagnosis Date  . Breast cancer ([place])   . Family history of breast cancer   . Pulmonary embolus ([place]) 2020    PAST SURGICAL HISTORY: Past Surgical History:  Procedure Laterality Date  . AUGMENTATION MAMMAPLASTY Left 04/2019  . BREAST BIOPSY Right 11/22/2018   atypical , negative breast cancer  . BREAST RECONSTRUCTION WITH PLACEMENT OF TISSUE EXPANDER AND ALLODERM Left 11/22/2018   Procedure: LEFT BREAST RECONSTRUCTION WITH TISSUE EXPANDER AND ACELLULAR DERMIS;  Surgeon: [name], MD;  Location: [facility];  Service: Plastics;  Laterality: Left;  . BREAST REDUCTION SURGERY Right 04/26/2019   Procedure: RIGHT BREAST REDUCTION;  Surgeon: [name], MD;  Location: [facility];  Service: Plastics;  Laterality: Right;  . Broken Wrist  1999  . CESAREAN SECTION    . endometrial biopsy  10/15  . LIPOSUCTION WITH LIPOFILLING Left 04/26/2019   Procedure: LIPOFILLING FROM ABDOMEN TO LEFT CHEST;  Surgeon: [name], MD;  Location: [facility];  Service: Plastics;  Laterality: Left;  Marland Kitchen MASTECTOMY Left 11/22/2018   total/complete with  tissue expander and implant   . MASTECTOMY W/ SENTINEL NODE BIOPSY Left 11/22/2018   Procedure: LEFT TOTAL MASTECTOMY WITH LEFT AXILLARY SENTINEL LYMPH NODE BIOPSY;  Surgeon: Rolm Bookbinder, MD;  Location: [facility];  Service: General;  Laterality: Left;  . RADIOACTIVE SEED GUIDED EXCISIONAL BREAST BIOPSY Right 11/22/2018   Procedure: RIGHT BREAST RADIOACTIVE SEED GUIDED EXCISIONAL BIOPSY;  Surgeon: Rolm Bookbinder, MD;  Location: [facility];  Service: General;  Laterality: Right;  . REDUCTION MAMMAPLASTY Right 04/26/2019  . REMOVAL OF TISSUE EXPANDER AND PLACEMENT OF IMPLANT Left 04/26/2019   Procedure: REMOVAL OF LEFT TISSUE EXPANDER AND PLACEMENT OF IMPLANT;  Surgeon: [name], MD;  Location: [facility];  Service: Plastics;  Laterality: Left;     FAMILY HISTORY: Family History  Problem Relation Age of Onset  . Breast cancer Mother 22       died 50; "negative genetic testing"  . Diabetes Father   . Hypertension Father   . Hydrocephalus Brother   . Breast cancer Maternal Grandmother 63       d. 63  . Breast cancer Paternal Grandmother 71       d. 51  . Breast cancer Paternal Aunt 71  . Breast cancer Paternal Aunt 88       "genetic testing negative"  . Other Maternal Grandfather        WWII  . Lymphoma Paternal Grandfather    Dhiya's father died from heart complications following a myocardial infarction at age 67. Patients' mother died from metastatic breast cancer at age 12. The patient has 1 brother and 1 sister. Shirrell's mother was diagnosed with breast cancer at age 62. Nirali's paternal grandmother, maternal grandmother, and two paternal aunts were diagnosed  with breast cancer. Patient denies anyone in her family having ovarian, prostate, or pancreatic cancer. Genita's paternal grandfather was diagnosed with lymphoma.    GYNECOLOGIC HISTORY:  Patient's last menstrual period was 09/23/2017. 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 She is divorced. She lives alone with her dog, a Engineer, petroleum mix (150 lbs). Her significant other, [name], works for the [employer] at a water treatment facility. Aja has one child, [name], who is 26, lives in [place], and graduated from [employer] with a degree in [employer]. [name] is currently working a Insurance claims handler.  ADVANCED DIRECTIVES: Not in place. Retal was given the appropriate healthcare power of attorney form at the 10/20/2018 visit to fill out and return at her discretion   HEALTH MAINTENANCE: Social History   Tobacco Use  . Smoking status: Former Smoker    Quit date: 09/08/1993    Years since quitting: 26.4  . Smokeless tobacco: Never Used  Substance Use Topics  . Alcohol use: Not Currently  . Drug use: No    Colonoscopy: no  PAP: yes, 09/10/2018  Bone density: no   Allergies  Allergen Reactions  . Sulfa Antibiotics Other (See Comments)    Pt unsure  . Penicillins Rash    Did it involve swelling of the face/tongue/throat, SOB, or low BP? No Did it involve sudden or severe rash/hives, skin peeling, or any reaction on the inside of your mouth or nose? Yes Did you need to seek medical attention at a hospital or doctor's office? No When did it last happen?unknown If all above answers are "NO", may proceed with cephalosporin use.     Current Outpatient Medications  Medication Sig Dispense Refill  . anastrozole (ARIMIDEX) 1 MG tablet Take 1 tablet (1 mg total) by mouth daily. 90 tablet 4  . Multiple Vitamin (MULTIVITAMIN) tablet Take 1 tablet by mouth daily.      Marland Kitchen VITAMIN D PO Take 1 capsule by mouth daily.      No current facility-administered medications for this visit.      OBJECTIVE: white woman who appears younger than stated age  9:   02/27/20 1606  BP: 131/70  Pulse: 84  Resp: 17  Temp: 98.5 F (36.9 C)  SpO2: 98%     Body mass index is 40.49 kg/m.   Wt Readings from Last 3 Encounters:  02/27/20 232 lb 3.2 oz (105.3 kg)  09/16/19 237 lb (107.5 kg)  08/17/19 240 lb 8 oz (109.1 kg)      ECOG FS:1 - Symptomatic but completely ambulatory  Sclerae unicteric, EOMs intact Wearing a mask No cervical or supraclavicular adenopathy Lungs no rales or rhonchi Heart regular rate and rhythm Abd soft, obese, nontender, positive bowel sounds MSK no focal spinal tenderness, no upper extremity lymphedema Neuro: nonfocal, well oriented, appropriate affect Breasts: The right breast is status post reduction mammoplasty.  The left breast is status post mastectomy with saline implant reconstruction.  There is no evidence of local recurrence.  Both axillae are benign.   LAB RESULTS:  CMP     Component Value Date/Time   NA 144 02/20/2020 1431   NA 141 08/06/2017 0927   K 4.4 02/20/2020 1431   CL 104 02/20/2020 1431   CO2  30 02/20/2020 1431   GLUCOSE 90 02/20/2020 1431   BUN 12 02/20/2020 1431   BUN 9 08/06/2017 0927   CREATININE 0.77 02/20/2020 1431   CREATININE 0.80 10/20/2018 0831   CREATININE 0.74 07/30/2016 1049   CALCIUM 9.8 02/20/2020 1431   PROT 7.4 02/20/2020 1431   PROT 7.1 08/06/2017 0927   ALBUMIN 4.3 02/20/2020 1431   ALBUMIN 4.3 08/06/2017 0927   AST 25 02/20/2020 1431   AST 34 10/20/2018 0831   ALT 46 (H) 02/20/2020 1431   ALT 60 (H) 10/20/2018 0831   ALKPHOS 78 02/20/2020 1431   BILITOT 0.2 (L) 02/20/2020 1431   BILITOT <0.2 (L) 10/20/2018 0831   GFRNONAA >60 02/20/2020 1431   GFRNONAA >60 10/20/2018 0831   GFRAA >60 02/20/2020 1431   GFRAA >60 10/20/2018 0831    No results found for: TOTALPROTELP, ALBUMINELP, A1GS, A2GS, BETS, BETA2SER, GAMS, MSPIKE, SPEI  No results found for: KPAFRELGTCHN, LAMBDASER,  KAPLAMBRATIO  Lab Results  Component Value Date   WBC 8.3 02/20/2020   NEUTROABS 5.8 02/20/2020   HGB 11.8 (L) 02/20/2020   HCT 35.4 (L) 02/20/2020   MCV 87.8 02/20/2020   PLT 333 02/20/2020   No results found for: LABCA2  No components found for: XBDZHG992  No results for input(s): INR in the last 168 hours.  No results found for: LABCA2  No results found for: EQA834  No results found for: HDQ222  No results found for: LNL892  No results found for: CA2729  No components found for: HGQUANT  No results found for: CEA1 / No results found for: CEA1   No results found for: AFPTUMOR  No results found for: CHROMOGRNA  No results found for: HGBA, HGBA2QUANT, HGBFQUANT, HGBSQUAN (Hemoglobinopathy evaluation)   No results found for: LDH  Lab Results  Component Value Date   IRON 65 07/30/2016   TIBC 439 07/30/2016   IRONPCTSAT 15 07/30/2016   (Iron and TIBC)  Lab Results  Component Value Date   FERRITIN 33 07/30/2016    Urinalysis    Component Value Date/Time   COLORURINE YELLOW 07/29/2013 1501   APPEARANCEUR CLEAR 07/29/2013 1501   LABSPEC 1.005 07/29/2013 1501   PHURINE 6.5 07/29/2013 1501   GLUCOSEU NEG 07/29/2013 1501   HGBUR NEG 07/29/2013 1501   BILIRUBINUR n 07/30/2016 0840   KETONESUR NEG 07/29/2013 1501   PROTEINUR n 07/30/2016 0840   PROTEINUR NEG 07/29/2013 1501   UROBILINOGEN negative 07/30/2016 0840   UROBILINOGEN 0.2 07/29/2013 1501   NITRITE n 07/30/2016 0840   NITRITE NEG 07/29/2013 1501   LEUKOCYTESUR Negative 07/30/2016 0840    STUDIES:  No results found.   ELIGIBLE FOR AVAILABLE RESEARCH PROTOCOL: no   ASSESSMENT: 54 y.o. [place], [place] status post left breast upper outer quadrant biopsy 10/08/2018 for a clinical [place] N0 invasive ductal carcinoma, grade 1, estrogen and progesterone receptor positive, HER-2 nonamplified, with an MIB-1 of less than 1%  (1) status post right breast upper inner quadrant biopsy 10/14/2018 showing atypical  ductal hyperplasia and atypical lobular hyperplasia  (2) tamoxifen started neoadjuvantly 10/20/2018, stopped 11/26/2018 (a week prior to surgery) and not resumed postop due to development of DVT/PE  (3) status post left total mastectomy with sentinel lymph node sampling 11/22/2018 for a pT1a pN0, stage IA invasive ductal carcinoma, grade 1, with negative margins  (a) a total of 3 sentinel and 2 intramammary lymph nodes removed  (b) immediate left expander placement followed by saline implant placement 04/26/2019  (4) status post right  lumpectomy 11/22/2018 showing atypical ductal hyperplasia  (5) multifocal acute segmental and subsegmental pulmonary embolism documented by CT angiogram 12/03/2018; no hypotension, negative for right heart strain  (a) IV heparin 12/03/2018 through 12/06/2018  (b) started apixaban 12/06/2018, 10 mg BID x 7 d, then 5 mg BID  (c) D-dimer 03/15/2019 = less than 0.27  (d) stopped apixaban 04/08/2019  (e) repeat d-dimer October 2020 WNL  (6) Genetic testing through [facility] Common Hereditary Cancers Panel completed on 12/03/2017 showed no deleterious mutations in: APC, ATM, AXIN2, BARD1, BMPR1A, BRCA1, BRCA2, BRIP1, CDH1, CDK4, CDKN2A (p14ARF), CDKN2A (p16INK4a), CHEK2, CTNNA1, DICER1, EPCAM*, GREM1*, KIT, MEN1, MLH1, MSH2, MSH3, MSH6, MUTYH, NBN, NF1, PALB2, PDGFRA, PMS2, POLD1, POLE, PTEN, RAD50, RAD51C, RAD51D, SDHB, SDHC, SDHD, SMAD4, SMARCA4, STK11, TP53, TSC1, TSC2, VHL The following genes were evaluated for sequence changes only: HOXB13*, NTHL1*, SDHA.  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  (7) started anastrozole 04/09/2019  (a) on 03/15/2019 [place] was 32.0 and estradiol 29.3.  (b) on 08/10/2019 FSH was 40.8, estradiol 20.4.  (c) on 02/20/2020 FSH was 76.6.   PLAN: Gwyndolyn Saxon is now a little over a year out from definitive surgery for her breast cancer with no evidence of disease recurrence.  This is  favorable.  She is tolerating tamoxifen generally well.  The plan will be to continue that for a total of 5 years.  She is now clearly postmenopausal and I do not think we need to continue to follow her [place] and estradiol levels.  I reassured her that the knee problem is not due to anastrozole.  It is better so right now she does not need to do anything about it but if it recurs or worsens I recommend that she be evaluated by an orthopedist.  The is very minimal elevation in her ALT is likely to be due to to fatty liver.  We discussed that at length.  The treatment for that is low-carb diet and exercise.  She will give that a try  Otherwise she will have her next mammogram in January and see me in February 2022.  She knows to call for any other issue that may develop before then  Total encounter time 30 minutes.*  Mechel Haggard, Virgie Dad, MD  02/27/20 6:11 PM Medical Oncology and Hematology [facility] [place], [place] [address] Tel. [phone]    Fax. [phone]    I, Wilburn Mylar, am acting as scribe for Dr. Virgie Dad. Beverly Suriano.  I, Lurline Del MD, have reviewed the above documentation for accuracy and completeness, and I agree with the above.   *Total Encounter Time as defined by the Centers for Medicare and Medicaid Services includes, in addition to the face-to-face time of a patient visit (documented in the note above) non-face-to-face time: obtaining and reviewing outside history, ordering and reviewing medications, tests or procedures, care coordination (communications with other health care professionals or caregivers) and documentation in the medical record.

## 2020-02-27 ENCOUNTER — Other Ambulatory Visit: Payer: Self-pay

## 2020-02-27 ENCOUNTER — Inpatient Hospital Stay (HOSPITAL_BASED_OUTPATIENT_CLINIC_OR_DEPARTMENT_OTHER): Payer: No Typology Code available for payment source | Admitting: Oncology

## 2020-02-27 VITALS — BP 131/70 | HR 84 | Temp 98.5°F | Resp 17 | Ht 63.5 in | Wt 232.2 lb

## 2020-02-27 DIAGNOSIS — C50412 Malignant neoplasm of upper-outer quadrant of left female breast: Secondary | ICD-10-CM

## 2020-02-27 DIAGNOSIS — Z170 Estrogen receptor positive status [ER+]: Secondary | ICD-10-CM | POA: Diagnosis not present

## 2020-02-27 LAB — ESTRADIOL, ULTRA SENS: Estradiol, Sensitive: 3 pg/mL

## 2020-02-28 ENCOUNTER — Telehealth: Payer: Self-pay | Admitting: Oncology

## 2020-02-28 NOTE — Telephone Encounter (Signed)
Scheduled appts per 6/21 los. Left voicemail with appt date and time.

## 2020-03-29 ENCOUNTER — Other Ambulatory Visit: Payer: Self-pay

## 2020-03-29 ENCOUNTER — Other Ambulatory Visit: Payer: Self-pay | Admitting: Oncology

## 2020-03-29 ENCOUNTER — Ambulatory Visit (INDEPENDENT_AMBULATORY_CARE_PROVIDER_SITE_OTHER): Payer: No Typology Code available for payment source | Admitting: Dermatology

## 2020-03-29 DIAGNOSIS — Z85828 Personal history of other malignant neoplasm of skin: Secondary | ICD-10-CM

## 2020-03-29 DIAGNOSIS — Z1231 Encounter for screening mammogram for malignant neoplasm of breast: Secondary | ICD-10-CM

## 2020-03-29 DIAGNOSIS — L820 Inflamed seborrheic keratosis: Secondary | ICD-10-CM

## 2020-03-29 DIAGNOSIS — L708 Other acne: Secondary | ICD-10-CM | POA: Diagnosis not present

## 2020-03-29 DIAGNOSIS — L814 Other melanin hyperpigmentation: Secondary | ICD-10-CM

## 2020-03-29 DIAGNOSIS — L821 Other seborrheic keratosis: Secondary | ICD-10-CM

## 2020-03-29 DIAGNOSIS — Z853 Personal history of malignant neoplasm of breast: Secondary | ICD-10-CM

## 2020-03-29 DIAGNOSIS — D229 Melanocytic nevi, unspecified: Secondary | ICD-10-CM

## 2020-03-29 DIAGNOSIS — Z1283 Encounter for screening for malignant neoplasm of skin: Secondary | ICD-10-CM | POA: Diagnosis not present

## 2020-03-29 DIAGNOSIS — L578 Other skin changes due to chronic exposure to nonionizing radiation: Secondary | ICD-10-CM

## 2020-03-29 DIAGNOSIS — D1800 Hemangioma unspecified site: Secondary | ICD-10-CM

## 2020-03-29 NOTE — Progress Notes (Signed)
   Follow-Up Visit   Subjective  Jennifer Harrison is a 54 y.o. female who presents for the following: Annual Exam (History of BCC - TBSE today). The patient presents for Total-Body Skin Exam (TBSE) for skin cancer screening and mole check.  The following portions of the chart were reviewed this encounter and updated as appropriate:  Tobacco  Allergies  Meds  Problems  Med Hx  Surg Hx  Fam Hx     Review of Systems:  No other skin or systemic complaints except as noted in HPI or Assessment and Plan.  Objective  Well appearing patient in no apparent distress; mood and affect are within normal limits.  A full examination was performed including scalp, head, eyes, ears, nose, lips, neck, chest, axillae, abdomen, back, buttocks, bilateral upper extremities, bilateral lower extremities, hands, feet, fingers, toes, fingernails, and toenails. All findings within normal limits unless otherwise noted below.  Objective  Left ant nasal alar crease: Well healed scar with no evidence of recurrence.   Objective  Left Upper Eyelid: Erythematous keratotic or waxy stuck-on papule or plaque.   Objective  Right post ear: Pink papule.  Objective  Left Breast: Scarring.   Assessment & Plan    Lentigines - Scattered tan macules - Discussed due to sun exposure - Benign, observe - Call for any changes  Seborrheic Keratoses - Stuck-on, waxy, tan-brown papules and plaques  - Discussed benign etiology and prognosis. - Observe - Call for any changes  Melanocytic Nevi - Tan-brown and/or pink-flesh-colored symmetric macules and papules - Benign appearing on exam today - Observation - Call clinic for new or changing moles - Recommend daily use of broad spectrum spf 30+ sunscreen to sun-exposed areas.   Hemangiomas - Red papules - Discussed benign nature - Observe - Call for any changes  Actinic Damage - diffuse scaly erythematous macules with underlying dyspigmentation -  Recommend daily broad spectrum sunscreen SPF 30+ to sun-exposed areas, reapply every 2 hours as needed.  - Call for new or changing lesions.  Skin cancer screening performed today.  History of basal cell carcinoma (BCC) Left ant nasal alar crease  Clear today. Discussed very low risk of recurrence. Observe   Inflamed seborrheic keratosis Left Upper Eyelid  Destruction of lesion - Left Upper Eyelid Complexity: simple   Destruction method: cryotherapy   Informed consent: discussed and consent obtained   Timeout:  patient name, date of birth, surgical site, and procedure verified Lesion destroyed using liquid nitrogen: Yes   Region frozen until ice ball extended beyond lesion: Yes   Outcome: patient tolerated procedure well with no complications   Post-procedure details: wound care instructions given    Other acne Right post ear  Epiduo Forte qd - sample given.  History of breast cancer Left Breast  History of mastectomy of left breast followed by reconstruction and lumpectomy of right breast followed reduction. No lymphadenopathy.  Skin cancer screening  Return in about 1 year (around 03/29/2021) for TBSE.  I, Ashok Cordia, CMA, am acting as scribe for Sarina Ser, MD .  Documentation: I have reviewed the above documentation for accuracy and completeness, and I agree with the above.  Sarina Ser, MD

## 2020-03-29 NOTE — Patient Instructions (Signed)

## 2020-03-30 ENCOUNTER — Other Ambulatory Visit: Payer: Self-pay | Admitting: Oncology

## 2020-04-01 ENCOUNTER — Encounter: Payer: Self-pay | Admitting: Dermatology

## 2020-06-14 ENCOUNTER — Telehealth: Payer: Self-pay | Admitting: *Deleted

## 2020-06-14 NOTE — Telephone Encounter (Signed)
Received vm call from pt with concern that her lab & MD appt are same day & she usually has lab week before.  She also has a question about a supplement she is considering.  Returned call & informed that if she prefers to come 1 wk before for lab she could. Informed that Dr [name] may not have all her labs resulted during visit but we can call her with any concerns.  She decided to leave appt as is.  She is considering a supplement called Vitathority-a collagen drug that she heard helps promote joint health but she has also read that it promotes tumor growth.  Informed that we will discuss with Dr [name] & possibly our pharmacist.  Messages to both.

## 2020-07-03 ENCOUNTER — Telehealth: Payer: Self-pay | Admitting: *Deleted

## 2020-07-03 NOTE — Telephone Encounter (Signed)
----- Message from Gardenia Phlegm, NP sent at 07/02/2020  8:32 AM EDT ----- Regarding: FW: Vitathority supplement Here is the answer to your question.    My opinion would be to avoid it, since there is no data to support or refute it.   ----- Message ----- From: Tora Kindred, [facility] Sent: 06/28/2020  11:49 AM EDT To: Theodoro Parma, Student-PharmD, # Subject: RE: Vitathority supplement                     I did a little bit more digging and found an article stating that in mice, macrophages supply residual cancer cells with collagen that they need to grow.  There is thought that collagen promotes tumor growth because recurrent tumors have high levels of collagen and breast cancer patients w/ high amounts of collagen in their tumors often have worse outcomes.   This may become a target of interest for treatment development - if a treatment can stop the supply of residual tumor cells w/ collagen, recurrence may be prevented to some degree.  Thanks, GT ----- Message ----- From: Gardenia Phlegm, NP Sent: 06/27/2020   3:04 PM EDT To: Tora Kindred, RPH, # Subject: RE: Vitathority supplement                     Thanks Carolann Littler.  I see you guys as pharmacy providers :)    When looking at info surrounding the patients question, it is noted that increased collagen is found in women with increased breast density, which is loosely associated with higher breast cancer risk.    While collagen supplements are good for skin, bones/joints, etc, is there any data that it is safe, or a causal link that you know of that makes it increase breast density or breast cancer risk?  I haven't seen this--but you guys often have more up to date info, and a deeper understanding of some of the supplements more so than I do.  Please let me know if I am missing anything.    Thanks for your expertise and help :)  Once I hear from you I will send to our nursing staff to answer.   ----- Message  ----- From: Margaret Pyle, [facility] Sent: 06/27/2020   2:41 PM EDT To: Tora Kindred, RPH, # Subject: RE: Vitathority supplement                     This is a provider question regarding density.  The medication effectiveness or lack of it should not have anything to do with breast density. ----- Message ----- From: Gardenia Phlegm, NP Sent: 06/26/2020   4:35 PM EDT To: Tora Kindred, [place], # Subject: RE: Kerr, can you please give some guidance on this?  ----- Message ----- From: Jesse Fall, RN Sent: 06/26/2020   9:40 AM EDT To: Tora Kindred, RPH, # Subject: RE: Vitathority supplement                     I notified pt of no interactions but she wants to know if this is safe for a breast cancer pt to take with dense breast.  Can anyone address this? ----- Message ----- From: Benn Moulder, [facility] Sent: 06/15/2020   9:53 AM EDT To: Tora Kindred, RPH, #  Subject: RE: Vitathority supplement                     Hello All!It does not appear that there is a drug interaction between the Vitathority/collagen products and anastrozole. Thank you! - Lilia Pro  ----- Message ----- From: Tora Kindred, [facility] Sent: 06/14/2020   5:50 PM EDT To: Theodoro Parma, Student-PharmD, # Subject: Pike,  Would you please review pts current med list & treatment plans to see if there are interactions w/Vitathority-a collagen drug that she heard helps promote joint health. Please respond all w/ your findings.  Thanks, [employer]

## 2020-07-03 NOTE — Telephone Encounter (Signed)
Notified pt of Lindsey's message & pt was appreciative of this message & will not take Vitathority supplement.

## 2020-09-21 ENCOUNTER — Ambulatory Visit: Payer: No Typology Code available for payment source | Admitting: Certified Nurse Midwife

## 2020-10-08 ENCOUNTER — Other Ambulatory Visit: Payer: Self-pay

## 2020-10-08 ENCOUNTER — Ambulatory Visit
Admission: RE | Admit: 2020-10-08 | Discharge: 2020-10-08 | Disposition: A | Discharge status: Home / Self Care | Payer: No Typology Code available for payment source | Source: Ambulatory Visit | Attending: Oncology | Admitting: Oncology

## 2020-10-08 DIAGNOSIS — Z1231 Encounter for screening mammogram for malignant neoplasm of breast: Secondary | ICD-10-CM

## 2020-10-09 ENCOUNTER — Other Ambulatory Visit: Payer: Self-pay | Admitting: Oncology

## 2020-10-09 DIAGNOSIS — R928 Other abnormal and inconclusive findings on diagnostic imaging of breast: Secondary | ICD-10-CM

## 2020-10-11 ENCOUNTER — Ambulatory Visit
Admission: RE | Admit: 2020-10-11 | Discharge: 2020-10-11 | Disposition: A | Discharge status: Home / Self Care | Payer: No Typology Code available for payment source | Source: Ambulatory Visit | Attending: Oncology | Admitting: Oncology

## 2020-10-11 ENCOUNTER — Other Ambulatory Visit: Payer: Self-pay

## 2020-10-11 ENCOUNTER — Other Ambulatory Visit: Payer: Self-pay | Admitting: Oncology

## 2020-10-11 DIAGNOSIS — R928 Other abnormal and inconclusive findings on diagnostic imaging of breast: Secondary | ICD-10-CM

## 2020-10-11 DIAGNOSIS — R921 Mammographic calcification found on diagnostic imaging of breast: Secondary | ICD-10-CM

## 2020-10-12 ENCOUNTER — Other Ambulatory Visit (HOSPITAL_COMMUNITY): Payer: Self-pay | Admitting: Diagnostic Radiology

## 2020-10-12 ENCOUNTER — Ambulatory Visit
Admission: RE | Admit: 2020-10-12 | Discharge: 2020-10-12 | Disposition: A | Discharge status: Home / Self Care | Payer: No Typology Code available for payment source | Source: Ambulatory Visit | Attending: Oncology | Admitting: Oncology

## 2020-10-12 DIAGNOSIS — R921 Mammographic calcification found on diagnostic imaging of breast: Secondary | ICD-10-CM

## 2020-10-12 HISTORY — PX: BREAST BIOPSY: SHX20

## 2020-10-15 NOTE — Progress Notes (Signed)
Jennifer  [phone] [phone]    ID: Jennifer Harrison DOB: 31-Dec-1965  MR#: [id number]  EUM#:[id number]  Patient Care Team: Patient, [name] as PCP - General (General Practice) Rolm Bookbinder, MD as Consulting Physician (General Surgery) Jenisis Harmsen, Virgie Dad, MD as Consulting Physician (Oncology) Eppie Gibson, MD as Attending Physician (Radiation Oncology) Rockwell Germany, RN as Oncology Nurse Navigator Mauro Kaufmann, RN as Oncology Nurse Navigator Regina Eck, CNM as Referring Physician (Certified Nurse Midwife) [name], MD as Consulting Physician (Plastic Surgery) OTHER MD:   CHIEF COMPLAINT: Estrogen receptor positive breast cancer (s/p left mastectomy)  CURRENT TREATMENT: Anastrozole   INTERVAL HISTORY: Jennifer Harrison returns today for follow-up of her estrogen receptor positive breast cancer.  She was started on anastrozole on 04/09/2019.  She is tolerating this remarkably well.  Hot flashes and vaginal dryness are not major issues at this point  Since her last visit, she had routine right screening mammography on 10/08/2020 showing possible calcifications in right breast. She underwent right diagnostic mammogram on 10/08/2020 at [facility] showing: breast density category C; indeterminate 7 mm calcifications within upper-inner right breast, anterior to recent surgical excision site.  She proceeded to biopsy of the right breast calcifications on 10/12/2020. Pathology from the procedure ([id number]) showed: lobular neoplasia (atypical lobular hyperplasia); columnar cell hyperplasia with calcifications.   REVIEW OF SYSTEMS: Jennifer Harrison is still hurting some from her recent surgeries and other recent biopsy.  She "wants the spot removed" by which she means the biopsied area with atypical lobular hyperplasia just mentioned above.  She usually exercises by walking, tries to walk 30 to 45 minutes a day, and tries to get  10,000 steps a day which she manages to do most weekends.  She is also starting a yoga program because she thinks her legs are weak and when she is on the ground it is very difficult for her to stand up.  A detailed review of systems was otherwise noncontributory   COVID 19 VACCINATION STATUS: unvaccinated; had COVID OCT 2020   HISTORY OF CURRENT ILLNESS: From the original intake note:  Jennifer Harrison underwent one-year follow up bilateral diagnostic mammography with tomography for benign right breast calcifications at [facility] on 10/04/2018 showing: Breast Density Category B. There are new calcifications in the upper outer and retroareolar left breast.. Magnification views confirm pleomorphic calcifications spanning approximately 10 cm anterior to posterior diameter, by 8 cm craniocaudal span by approximately 7 cm transverse diameter. While the majority of the calcifications are in the upper-outer quadrant, there are also suspicious calcifications in the central and medial retroareolar left breast, upper inner quadrant. No suspicious mass or distortion is identified in the left breast.   Accordingly on 10/08/2018 she proceeded to biopsies of the left breast areas in question. 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 Prognostic indicators significant for: estrogen receptor, 50% positive with moderate staining intensity and progesterone receptor, 50% positive with moderate-weak staining intensity. Proliferation marker Ki67 at <1%. HER2 negative (1+) by immunohistochemistry.   She underwent an additional left breast biopsy on the same day. The pathology from this retroareolar upper outer quadrant procedure showed ([id number]): ductal carcinoma in situ with calcifications, intermediate grade.  Prognostic panel was not obtained on this tissue.  Then, she underwent an ultrasound of  bilateral axillae on 10/14/2018 showing no abnormalities within either axilla. No abnormal lymph nodes  are identified.   Magnification views of the right breast show mammographically stable predominately punctate calcifications in the upper inner quadrant, a few of which demonstrate layering. There are additional scattered calcifications in the inner inner right breast. No suspicious mass or architectural distortion on the right.    Finally, she underwent a biopsy of the right breast area in question on 10/14/2018. The pathology from this procedure showed ([id number]): atypical ductal hyperplasia with calcifications, lobular neoplasia (atypical lobular hyperplasia), and fibrocystic changes with calcifications.   The patient's subsequent history is as detailed below.   PAST MEDICAL HISTORY: Past Medical History:  Diagnosis Date  . Basal cell carcinoma 12/22/2019   left ant nasal ala crease  . Breast cancer ([place])   . Family history of breast cancer   . Pulmonary embolus ([place]) 2020    PAST SURGICAL HISTORY: Past Surgical History:  Procedure Laterality Date  . AUGMENTATION MAMMAPLASTY Left 04/2019  . BREAST BIOPSY Right 11/22/2018   atypical , negative breast cancer  . BREAST RECONSTRUCTION WITH PLACEMENT OF TISSUE EXPANDER AND ALLODERM Left 11/22/2018   Procedure: LEFT BREAST RECONSTRUCTION WITH TISSUE EXPANDER AND ACELLULAR DERMIS;  Surgeon: [name], MD;  Location: [place];  Service: Plastics;  Laterality: Left;  . BREAST REDUCTION SURGERY Right 04/26/2019   Procedure: RIGHT BREAST REDUCTION;  Surgeon: [name], MD;  Location: [place];  Service: Plastics;  Laterality: Right;  . Broken Wrist  1999  . CESAREAN SECTION    . endometrial biopsy  10/15  . LIPOSUCTION WITH LIPOFILLING Left 04/26/2019   Procedure: LIPOFILLING FROM ABDOMEN TO LEFT CHEST;  Surgeon: [name], MD;  Location: [place];  Service: Plastics;   Laterality: Left;  Marland Kitchen MASTECTOMY Left 11/22/2018   total/complete with tissue expander and implant   . MASTECTOMY W/ SENTINEL NODE BIOPSY Left 11/22/2018   Procedure: LEFT TOTAL MASTECTOMY WITH LEFT AXILLARY SENTINEL LYMPH NODE BIOPSY;  Surgeon: Rolm Bookbinder, MD;  Location: [place];  Service: General;  Laterality: Left;  . RADIOACTIVE SEED GUIDED EXCISIONAL BREAST BIOPSY Right 11/22/2018   Procedure: RIGHT BREAST RADIOACTIVE SEED GUIDED EXCISIONAL BIOPSY;  Surgeon: Rolm Bookbinder, MD;  Location: [place];  Service: General;  Laterality: Right;  . REDUCTION MAMMAPLASTY Right 04/26/2019  . REMOVAL OF TISSUE EXPANDER AND PLACEMENT OF IMPLANT Left 04/26/2019   Procedure: REMOVAL OF LEFT TISSUE EXPANDER AND PLACEMENT OF IMPLANT;  Surgeon: [name], MD;  Location: [place];  Service: Plastics;  Laterality: Left;    FAMILY HISTORY: Family History  Problem Relation Age of Onset  . Breast cancer Mother 69       died 52; "negative genetic testing"  . Diabetes Father   . Hypertension Father   . Hydrocephalus Brother   . Breast cancer Maternal Grandmother 73       d. 80  . Breast cancer Paternal Grandmother 15       d. 73  . Breast cancer Paternal Aunt 71  . Breast cancer Paternal Aunt 29       "genetic testing negative"  . Other Maternal Grandfather        WWII  . Lymphoma Paternal Grandfather   Jennifer Harrison's father died from heart complications following a myocardial infarction at age 40. Patients' mother died from metastatic breast cancer at age 41. The patient has 1 brother and 1 sister. Jennifer Harrison's mother was diagnosed with breast cancer at age 70. Jennifer Harrison's paternal grandmother, maternal grandmother, and two paternal  aunts were diagnosed with breast cancer. Patient denies anyone in her family having ovarian, prostate, or pancreatic cancer. Joannah's paternal grandfather was diagnosed with lymphoma.    GYNECOLOGIC HISTORY:   Patient's last menstrual period was 09/23/2017. 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 She is divorced. She lives alone with her dog, a Engineer, petroleum mix (150 lbs). Her significant other, [name], works for the [employer] at a water treatment facility. Tramaine has one child, [name], who is 52, lives in [place], and graduated from [employer] with a degree in [employer]. [name] is currently working a Insurance claims handler.  ADVANCED DIRECTIVES: Not in place. Jennifer Harrison was given the appropriate healthcare power of attorney form at the 10/20/2018 visit to fill out and return at her discretion   HEALTH MAINTENANCE: Social History   Tobacco Use  . Smoking status: Former Smoker    Quit date: 09/08/1993    Years since quitting: 27.1  . Smokeless tobacco: Never Used  Substance Use Topics  . Alcohol use: Not Currently  . Drug use: No    Colonoscopy: no  PAP: yes, 09/10/2018  Bone density: no   Allergies  Allergen Reactions  . Sulfa Antibiotics Other (See Comments)    Pt unsure  . Penicillins Rash    Did it involve swelling of the face/tongue/throat, SOB, or low BP? No Did it involve sudden or severe rash/hives, skin peeling, or any reaction on the inside of your mouth or nose? Yes Did you need to seek medical attention at a hospital or doctor's office? No When did it last happen?unknown If all above answers are "NO", may proceed with cephalosporin use.     Current Outpatient Medications  Medication Sig Dispense Refill  . anastrozole (ARIMIDEX) 1 MG tablet TAKE 1 TABLET BY MOUTH EVERY DAY 30 tablet 14  . Multiple Vitamin (MULTIVITAMIN) tablet Take 1 tablet by mouth daily.      Marland Kitchen VITAMIN D PO Take 1 capsule by mouth daily.       No current facility-administered medications for this visit.    OBJECTIVE: white woman in no acute distress  Vitals:   10/16/20 1437  BP: (!) 150/91  Pulse: 80  Resp: 18  Temp: 98.1 F (36.7 C)  SpO2: 100%     Body mass index is 41.59 kg/m.   Wt Readings from Last 3 Encounters:  10/16/20 238 lb 8 oz (108.2 kg)  02/27/20 232 lb 3.2 oz (105.3 kg)  09/16/19 237 lb (107.5 kg)      ECOG FS:1 - Symptomatic but completely ambulatory  Sclerae unicteric, EOMs intact Wearing a mask No cervical or supraclavicular adenopathy Lungs no rales or rhonchi Heart regular rate and rhythm Abd soft, nontender, positive bowel sounds MSK no focal spinal tenderness, no upper extremity lymphedema Neuro: nonfocal, well oriented, appropriate affect Breasts: The right breast is status post reduction mammoplasty.  The cosmetic result is good.  There is minimal erythema.  The left breast is status post mastectomy with saline implant reconstruction.  There is no evidence of local recurrence.  Both axillae are benign.   LAB RESULTS:  CMP     Component Value Date/Time   NA 142 10/16/2020 1418   NA 141 08/06/2017 0927   K 4.5 10/16/2020 1418  CL 102 10/16/2020 1418   CO2 32 10/16/2020 1418   GLUCOSE 84 10/16/2020 1418   BUN 11 10/16/2020 1418   BUN 9 08/06/2017 0927   CREATININE 0.76 10/16/2020 1418   CREATININE 0.80 10/20/2018 0831   CREATININE 0.74 07/30/2016 1049   CALCIUM 10.2 10/16/2020 1418   PROT 8.1 10/16/2020 1418   PROT 7.1 08/06/2017 0927   ALBUMIN 4.6 10/16/2020 1418   ALBUMIN 4.3 08/06/2017 0927   AST 31 10/16/2020 1418   AST 34 10/20/2018 0831   ALT 60 (H) 10/16/2020 1418   ALT 60 (H) 10/20/2018 0831   ALKPHOS 84 10/16/2020 1418   BILITOT 0.2 (L) 10/16/2020 1418   BILITOT <0.2 (L) 10/20/2018 0831   GFRNONAA >60 10/16/2020 1418   GFRNONAA >60 10/20/2018 0831   GFRAA >60 02/20/2020 1431   GFRAA >60 10/20/2018 0831    No results found for: TOTALPROTELP, ALBUMINELP,  A1GS, A2GS, BETS, BETA2SER, GAMS, MSPIKE, SPEI  No results found for: KPAFRELGTCHN, LAMBDASER, KAPLAMBRATIO  Lab Results  Component Value Date   WBC 9.5 10/16/2020   NEUTROABS 6.8 10/16/2020   HGB 12.5 10/16/2020   HCT 38.3 10/16/2020   MCV 89.1 10/16/2020   PLT 351 10/16/2020   No results found for: LABCA2  No components found for: [id number]  No results for input(s): INR in the last 168 hours.  No results found for: LABCA2  No results found for: EPP295  No results found for: JOA416  No results found for: SAY301  No results found for: CA2729  No components found for: HGQUANT  No results found for: CEA1 / No results found for: CEA1   No results found for: AFPTUMOR  No results found for: CHROMOGRNA  No results found for: HGBA, HGBA2QUANT, HGBFQUANT, HGBSQUAN (Hemoglobinopathy evaluation)   No results found for: LDH  Lab Results  Component Value Date   IRON 65 07/30/2016   TIBC 439 07/30/2016   IRONPCTSAT 15 07/30/2016   (Iron and TIBC)  Lab Results  Component Value Date   FERRITIN 33 07/30/2016    Urinalysis    Component Value Date/Time   COLORURINE YELLOW 07/29/2013 1501   APPEARANCEUR CLEAR 07/29/2013 1501   LABSPEC 1.005 07/29/2013 1501   PHURINE 6.5 07/29/2013 1501   GLUCOSEU NEG 07/29/2013 1501   HGBUR NEG 07/29/2013 1501   BILIRUBINUR n 07/30/2016 0840   KETONESUR NEG 07/29/2013 1501   PROTEINUR n 07/30/2016 0840   PROTEINUR NEG 07/29/2013 1501   UROBILINOGEN negative 07/30/2016 0840   UROBILINOGEN 0.2 07/29/2013 1501   NITRITE n 07/30/2016 0840   NITRITE NEG 07/29/2013 1501   LEUKOCYTESUR Negative 07/30/2016 0840    STUDIES:  MM Digital Diagnostic Unilat R  Result Date: 10/11/2020 CLINICAL DATA:  Patient recalled from screening for right breast calcifications. Patient had recent excision of the right breast demonstrating ADH. History of left mastectomy. EXAM: DIGITAL DIAGNOSTIC RIGHT MAMMOGRAM WITH CAD TECHNIQUE: Right digital  diagnostic mammography was performed. Mammographic images were processed with CAD. COMPARISON:  Previous exam(s). ACR Breast Density Category c: The breast tissue is heterogeneously dense, which may obscure small masses. FINDINGS: Magnification views of the right breast were obtained. There is a 7 mm group of punctate calcifications within the upper inner right breast anterior to the recent surgical excision site. These were further evaluated with magnification views. IMPRESSION: Indeterminate calcifications within the upper inner right breast anterior to the recent surgical excision site. RECOMMENDATION: Stereotactic guided core needle biopsy right breast calcifications. I have discussed the findings and recommendations  with the patient. If applicable, a reminder letter will be sent to the patient regarding the next appointment. BI-RADS CATEGORY  4: Suspicious. Electronically Signed   By: Lovey Newcomer M.D.   On: 10/11/2020 12:24   MM 3D SCREEN BREAST UNI RIGHT  Result Date: 10/08/2020 CLINICAL DATA:  Screening. LEFT mastectomy. RIGHT breast calcifications demonstrated atypical ductal hyperplasia, status post lumpectomy. EXAM: DIGITAL SCREENING UNILATERAL RIGHT MAMMOGRAM WITH CAD AND TOMO COMPARISON:  Previous exam(s). ACR Breast Density Category c: The breast tissue is heterogeneously dense, which may obscure small masses. FINDINGS: In the right breast, calcifications warrant further evaluation with magnified views. Status post LEFT mastectomy. The images were evaluated with computer-aided detection. IMPRESSION: Further evaluation is suggested for calcifications in the right breast. RECOMMENDATION: Diagnostic mammogram of the right breast. (Code:[id number]) The patient will be contacted regarding the findings, and additional imaging will be scheduled. BI-RADS CATEGORY  0: Incomplete. Need additional imaging evaluation and/or prior mammograms for comparison. Electronically Signed   By: Valentino Saxon MD   On:  10/08/2020 13:07   MM CLIP PLACEMENT RIGHT  Result Date: 10/12/2020 CLINICAL DATA:  Status post stereotactic guided core biopsy of RIGHT breast calcifications. EXAM: DIAGNOSTIC RIGHT MAMMOGRAM POST STEREOTACTIC BIOPSY COMPARISON:  Previous exam(s). FINDINGS: Mammographic images were obtained following stereotactic guided biopsy of calcifications in the UPPER INNER QUADRANT of the RIGHT breast and placement of a coil shaped clip. The biopsy marking clip is in expected position at the site of biopsy. IMPRESSION: Appropriate positioning of the coil shaped biopsy marking clip at the site of biopsy in the [place] of the RIGHT breast. Final Assessment: Post Procedure Mammograms for Marker Placement Electronically Signed   By: Nolon Nations M.D.   On: 10/12/2020 11:38   MM RT BREAST BX W LOC DEV 1ST LESION IMAGE BX SPEC STEREO GUIDE  Addendum Date: 10/15/2020   ADDENDUM REPORT: 10/15/2020 13:41 ADDENDUM: Pathology revealed LOBULAR NEOPLASIA (ATYPICAL LOBULAR HYPERPLASIA), COLUMNAR CELL HYPERPLASIA WITH CALCIFICATIONS OF THE RIGHT BREAST, upper inner quadrant, (coil clip). This was found to be concordant by Dr. Nolon Nations, with surgical consultation for consideration of excision recommended. Pathology results were discussed with the patient by telephone. The patient reported doing well after the biopsy with tenderness at the site. Post biopsy instructions and care were reviewed and questions were answered. The patient was encouraged to call [facility] for any additional concerns. My direct phone number was provided. Surgical consultation has been arranged with Dr. Rolm Bookbinder at [facility] on November 14, 2020. Consideration for high risk screening MRI given breast density, personal history of LEFT breast cancer and RIGHT breast atypical ductal hyperplasia and atypical lobular hyperplasia. Pathology results reported by Terie Purser, RN on 10/15/2020.  Electronically Signed   By: Nolon Nations M.D.   On: 10/15/2020 13:41   Result Date: 10/15/2020 CLINICAL DATA:  Patient presents for stereotactic biopsy of the right breast. EXAM: RIGHT BREAST STEREOTACTIC CORE NEEDLE BIOPSY COMPARISON:  Previous exams. FINDINGS: The patient and I discussed the procedure of stereotactic-guided biopsy including benefits and alternatives. We discussed the high likelihood of a successful procedure. We discussed the risks of the procedure including infection, bleeding, tissue injury, clip migration, and inadequate sampling. Informed written consent was given. The usual time out protocol was performed immediately prior to the procedure. Using sterile technique and 1% Lidocaine as local anesthetic, under stereotactic guidance, a 9 gauge vacuum assisted device was used to perform core needle biopsy of calcifications  in the upper inner quadrant of the right breast using a craniocaudal approach. Specimen radiograph was performed showing calcifications in the tissue samples. Specimens with calcifications are identified for pathology. Lesion quadrant: Upper inner quadrant right At the conclusion of the procedure, coil shaped tissue marker clip was deployed into the biopsy cavity. Follow-up 2-view mammogram was performed and dictated separately. IMPRESSION: Stereotactic-guided biopsy of right breast. No apparent complications. Electronically Signed: By: Nolon Nations M.D. On: 10/12/2020 11:37     ELIGIBLE FOR AVAILABLE RESEARCH PROTOCOL: no   ASSESSMENT: 55 y.o. [place], [place] status post left breast upper outer quadrant biopsy 10/08/2018 for a clinical [place] N0 invasive ductal carcinoma, grade 1, estrogen and progesterone receptor positive, HER-2 nonamplified, with an MIB-1 of less than 1%  (1) status post right breast upper inner quadrant biopsy 10/14/2018 showing atypical ductal hyperplasia and atypical lobular hyperplasia  (2) tamoxifen started neoadjuvantly 10/20/2018, stopped  11/26/2018 (a week prior to surgery) and not resumed postop due to development of DVT/PE  (3) status post left total mastectomy with sentinel lymph node sampling 11/22/2018 for a pT1a pN0, stage IA invasive ductal carcinoma, grade 1, with negative margins  (a) a total of 3 sentinel and 2 intramammary lymph nodes removed  (b) immediate left expander placement followed by saline implant placement 04/26/2019  (4) status post right lumpectomy 11/22/2018 showing atypical ductal hyperplasia  (5) multifocal acute segmental and subsegmental pulmonary embolism documented by CT angiogram 12/03/2018; no hypotension, negative for right heart strain  (a) IV heparin 12/03/2018 through 12/06/2018  (b) started apixaban 12/06/2018, 10 mg BID x 7 d, then 5 mg BID  (c) D-dimer 03/15/2019 = less than 0.27  (d) stopped apixaban 04/08/2019  (e) repeat d-dimer October 2020 WNL  (6) Genetic testing through [facility] Common Hereditary Cancers Panel completed on 12/03/2017 showed no deleterious mutations in: APC, ATM, AXIN2, BARD1, BMPR1A, BRCA1, BRCA2, BRIP1, CDH1, CDK4, CDKN2A (p14ARF), CDKN2A (p16INK4a), CHEK2, CTNNA1, DICER1, EPCAM*, GREM1*, KIT, MEN1, MLH1, MSH2, MSH3, MSH6, MUTYH, NBN, NF1, PALB2, PDGFRA, PMS2, POLD1, POLE, PTEN, RAD50, RAD51C, RAD51D, SDHB, SDHC, SDHD, SMAD4, SMARCA4, STK11, TP53, TSC1, TSC2, VHL The following genes were evaluated for sequence changes only: HOXB13*, NTHL1*, SDHA.  (a)  a Variant of Uncertain Significance, c.133G>A (p.Ala45Thr), was identified in [place].  (7) started anastrozole 04/09/2019  (a) repeated [place] measurements confirm postmenopausal status  (b) bone density 11/23/2019 shows a T score of 0.6   PLAN: Jennifer Harrison will soon be 2 years out from definitive surgery for her breast cancer with no evidence of disease recurrence.  This is very favorable.  She is very concerned about the area of atypical lobular hyperplasia.  We discussed what this means.  She understands this is  frequently a field defect meaning that if we did several biopsies in both breasts we might find similar changes in some of those other biopsies.  Accordingly removing this 1 spot really will not solve the problem for her and in fact might complicate things matter by perhaps causing fat necrosis around the new scars which then would require further biopsies.  I think what she could consider is intensified screening.  She certainly has a greater than 20% chance of developing breast cancer in her lifetime.  She qualifies for yearly MRI as a result.  She understands the possible concerns regarding cost and false positives but is very interested in this approach.  Accordingly the plan will be for her to have a breast MRI in July, 6 months from her mammography, and  she will see me in August to follow-up on those results.  Total encounter time 30 minutes.*  Eddis Pingleton, Virgie Dad, MD  10/16/20 6:05 PM Medical Oncology and Hematology [facility] [place], [place] [address] Tel. [phone]    Fax. [phone]    I, Wilburn Mylar, am acting as scribe for Dr. Virgie Dad. Valla Pacey.  I, Lurline Del MD, have reviewed the above documentation for accuracy and completeness, and I agree with the above.   *Total Encounter Time as defined by the Centers for Medicare and Medicaid Services includes, in addition to the face-to-face time of a patient visit (documented in the note above) non-face-to-face time: obtaining and reviewing outside history, ordering and reviewing medications, tests or procedures, care coordination (communications with other health care professionals or caregivers) and documentation in the medical record.

## 2020-10-16 ENCOUNTER — Inpatient Hospital Stay: Payer: No Typology Code available for payment source

## 2020-10-16 ENCOUNTER — Inpatient Hospital Stay: Payer: No Typology Code available for payment source | Attending: Oncology | Admitting: Oncology

## 2020-10-16 ENCOUNTER — Other Ambulatory Visit: Payer: Self-pay

## 2020-10-16 VITALS — BP 150/91 | HR 80 | Temp 98.1°F | Resp 18 | Ht 63.5 in | Wt 238.5 lb

## 2020-10-16 DIAGNOSIS — Z807 Family history of other malignant neoplasms of lymphoid, hematopoietic and related tissues: Secondary | ICD-10-CM | POA: Diagnosis not present

## 2020-10-16 DIAGNOSIS — Z8249 Family history of ischemic heart disease and other diseases of the circulatory system: Secondary | ICD-10-CM | POA: Diagnosis not present

## 2020-10-16 DIAGNOSIS — Z803 Family history of malignant neoplasm of breast: Secondary | ICD-10-CM | POA: Diagnosis not present

## 2020-10-16 DIAGNOSIS — I2699 Other pulmonary embolism without acute cor pulmonale: Secondary | ICD-10-CM

## 2020-10-16 DIAGNOSIS — Z833 Family history of diabetes mellitus: Secondary | ICD-10-CM | POA: Insufficient documentation

## 2020-10-16 DIAGNOSIS — Z170 Estrogen receptor positive status [ER+]: Secondary | ICD-10-CM

## 2020-10-16 DIAGNOSIS — Z9189 Other specified personal risk factors, not elsewhere classified: Secondary | ICD-10-CM

## 2020-10-16 DIAGNOSIS — N6091 Unspecified benign mammary dysplasia of right breast: Secondary | ICD-10-CM | POA: Diagnosis not present

## 2020-10-16 DIAGNOSIS — Z79899 Other long term (current) drug therapy: Secondary | ICD-10-CM | POA: Insufficient documentation

## 2020-10-16 DIAGNOSIS — Z86711 Personal history of pulmonary embolism: Secondary | ICD-10-CM | POA: Insufficient documentation

## 2020-10-16 DIAGNOSIS — C50412 Malignant neoplasm of upper-outer quadrant of left female breast: Secondary | ICD-10-CM

## 2020-10-16 DIAGNOSIS — Z79811 Long term (current) use of aromatase inhibitors: Secondary | ICD-10-CM | POA: Diagnosis not present

## 2020-10-16 DIAGNOSIS — Z9012 Acquired absence of left breast and nipple: Secondary | ICD-10-CM | POA: Diagnosis not present

## 2020-10-16 DIAGNOSIS — Z87891 Personal history of nicotine dependence: Secondary | ICD-10-CM | POA: Insufficient documentation

## 2020-10-16 LAB — CBC WITH DIFFERENTIAL/PLATELET
Abs Immature Granulocytes: 0.03 10*3/uL (ref 0.00–0.07)
Basophils Absolute: 0.1 10*3/uL (ref 0.0–0.1)
Basophils Relative: 1 %
Eosinophils Absolute: 0.2 10*3/uL (ref 0.0–0.5)
Eosinophils Relative: 2 %
HCT: 38.3 % (ref 36.0–46.0)
Hemoglobin: 12.5 g/dL (ref 12.0–15.0)
Immature Granulocytes: 0 %
Lymphocytes Relative: 17 %
Lymphs Abs: 1.6 10*3/uL (ref 0.7–4.0)
MCH: 29.1 pg (ref 26.0–34.0)
MCHC: 32.6 g/dL (ref 30.0–36.0)
MCV: 89.1 fL (ref 80.0–100.0)
Monocytes Absolute: 0.9 10*3/uL (ref 0.1–1.0)
Monocytes Relative: 9 %
Neutro Abs: 6.8 10*3/uL (ref 1.7–7.7)
Neutrophils Relative %: 71 %
Platelets: 351 10*3/uL (ref 150–400)
RBC: 4.3 MIL/uL (ref 3.87–5.11)
RDW: 12 % (ref 11.5–15.5)
WBC: 9.5 10*3/uL (ref 4.0–10.5)
nRBC: 0 % (ref 0.0–0.2)

## 2020-10-16 LAB — COMPREHENSIVE METABOLIC PANEL
ALT: 60 U/L — ABNORMAL HIGH (ref 0–44)
AST: 31 U/L (ref 15–41)
Albumin: 4.6 g/dL (ref 3.5–5.0)
Alkaline Phosphatase: 84 U/L (ref 38–126)
Anion gap: 8 (ref 5–15)
BUN: 11 mg/dL (ref 6–20)
CO2: 32 mmol/L (ref 22–32)
Calcium: 10.2 mg/dL (ref 8.9–10.3)
Chloride: 102 mmol/L (ref 98–111)
Creatinine, Ser: 0.76 mg/dL (ref 0.44–1.00)
GFR, Estimated: >60 mL/min (ref >60)
Glucose, Bld: 84 mg/dL (ref 70–99)
Potassium: 4.5 mmol/L (ref 3.5–5.1)
Sodium: 142 mmol/L (ref 135–145)
Total Bilirubin: 0.2 mg/dL — ABNORMAL LOW (ref 0.3–1.2)
Total Protein: 8.1 g/dL (ref 6.5–8.1)

## 2020-10-16 MED ORDER — ANASTROZOLE 1 MG PO TABS: 1.0000 mg | ORAL_TABLET | Freq: Every day | ORAL | 4 refills | Status: DC

## 2020-10-17 ENCOUNTER — Telehealth: Payer: Self-pay | Admitting: Oncology

## 2020-10-17 LAB — FOLLICLE STIMULATING HORMONE: FSH: 81.6 m[IU]/mL

## 2020-10-17 NOTE — Telephone Encounter (Signed)
Scheduled appts per 2/8 los. Pt confirmed appt date and time.

## 2020-10-23 LAB — ESTRADIOL, ULTRA SENS: Estradiol, Sensitive: <2.5 pg/mL

## 2020-11-18 IMAGING — [facility] BREAST LOC DEV 1ST LESION INC*R*
8 series · 8 of 8 positions shown · non-contrast
Comparison: Previous exam(s).

CLINICAL DATA: Radioactive seed localization of the right breast
prior to lumpectomy.

EXAM:
MAMMOGRAPHIC GUIDED RADIOACTIVE SEED LOCALIZATION OF THE RIGHT
BREAST

[R ML (1 of 3)]
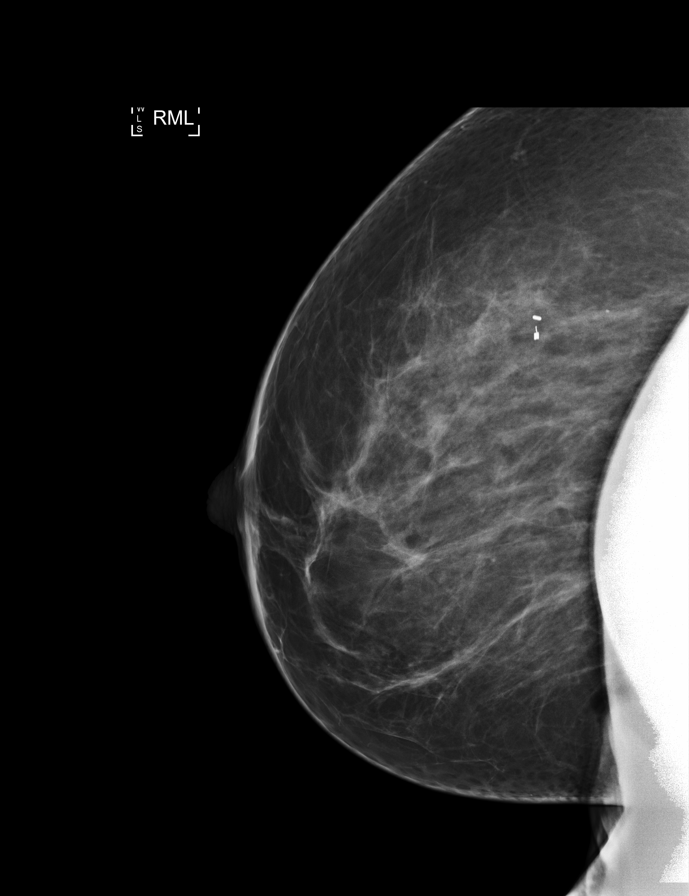

[R ML (2 of 3)]
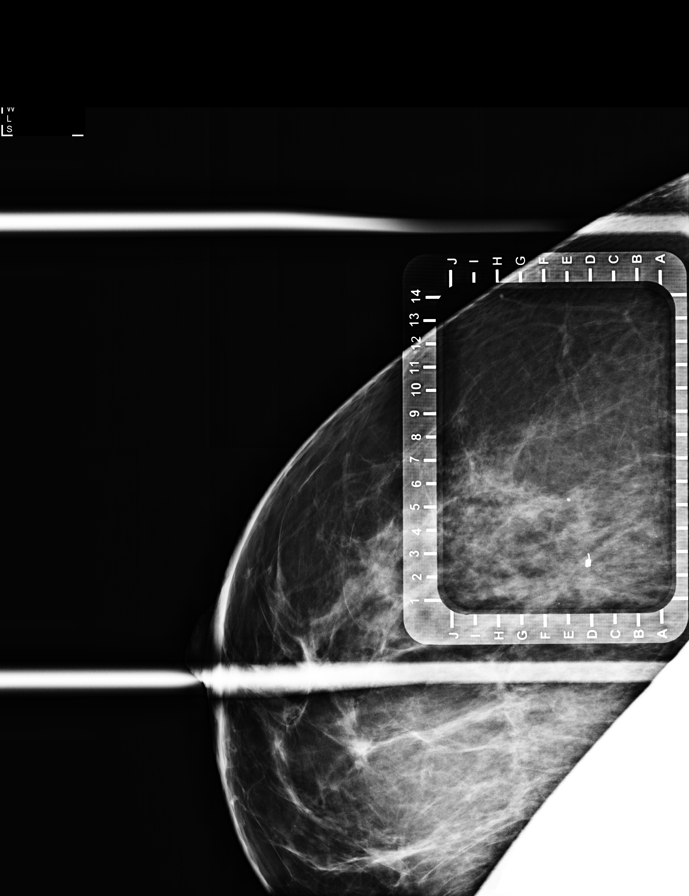

[R CC (1 of 5)]
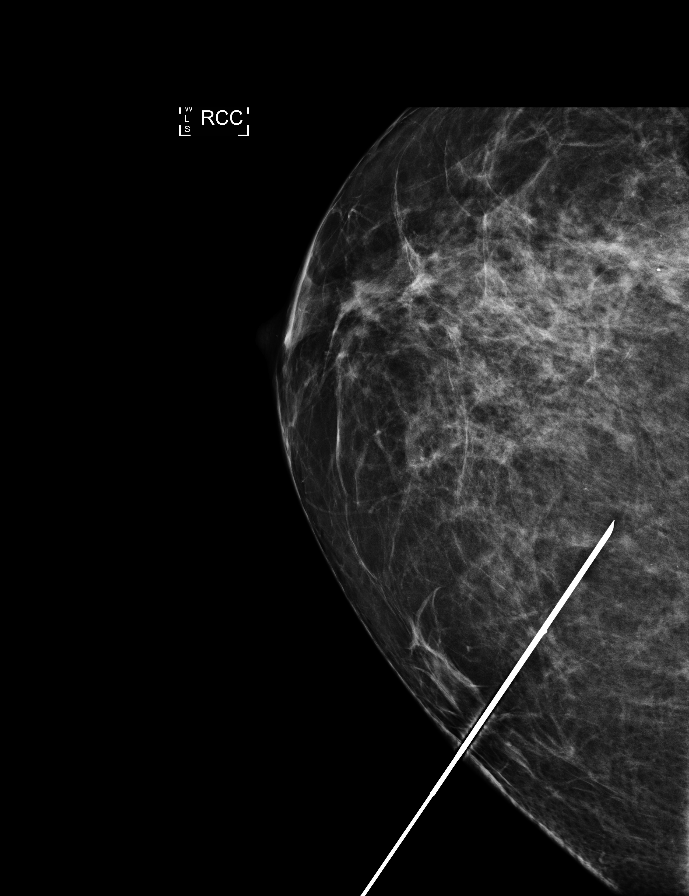

[R CC (2 of 5)]
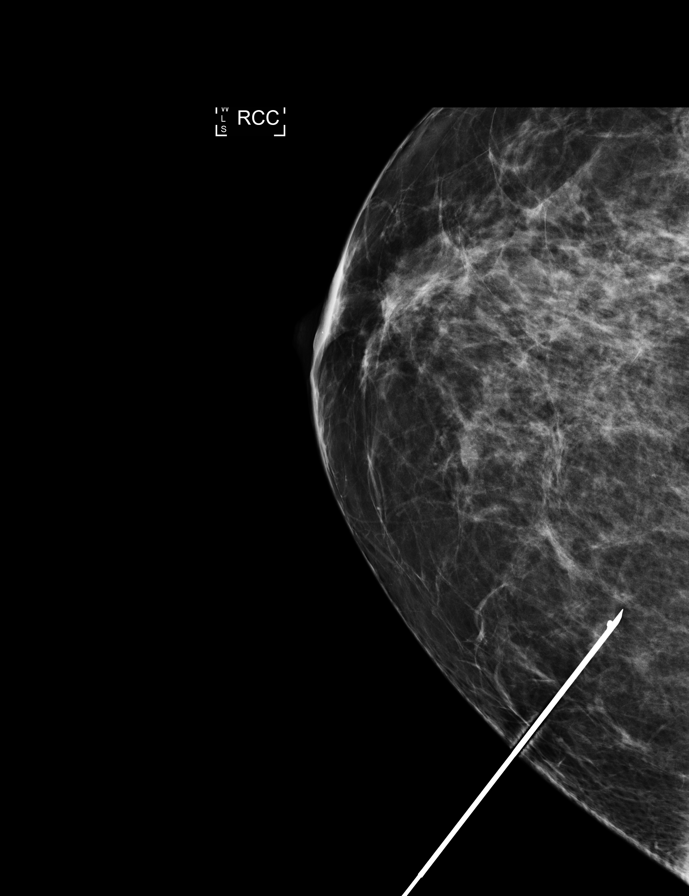

[R CC (3 of 5)]
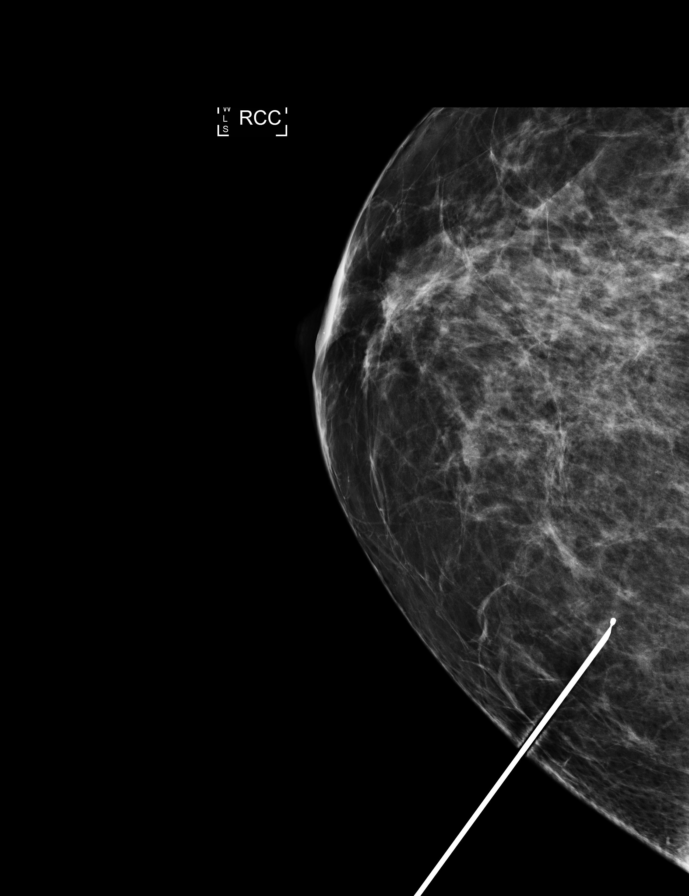

[R CC (4 of 5)]
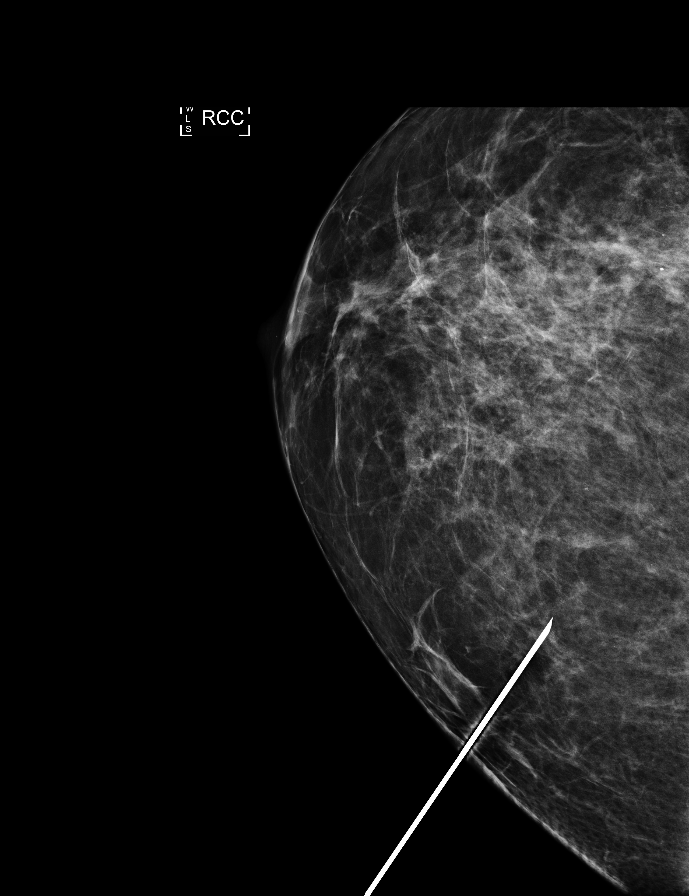

[R ML (3 of 3)]
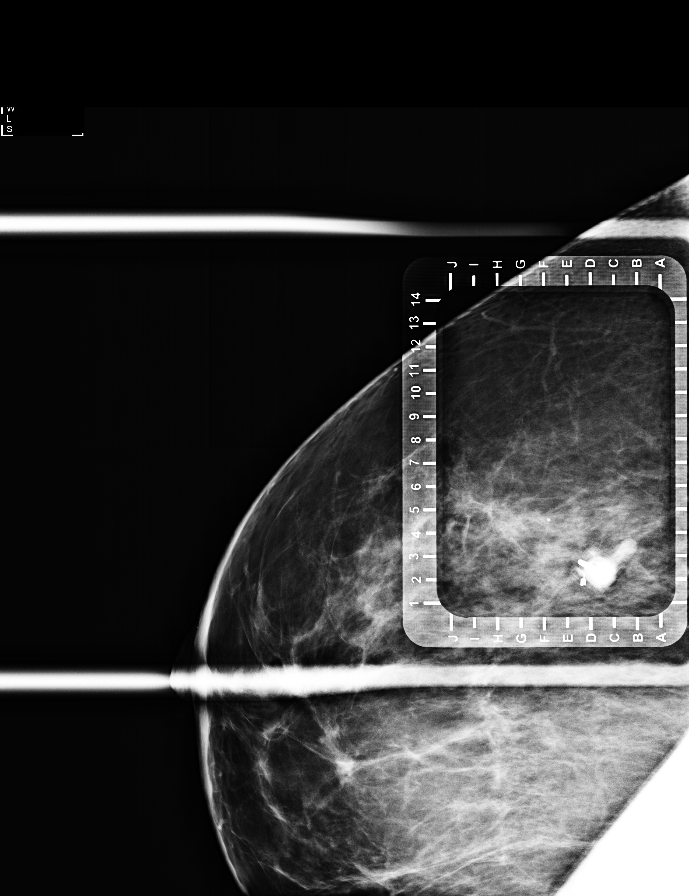

[R CC (5 of 5)]
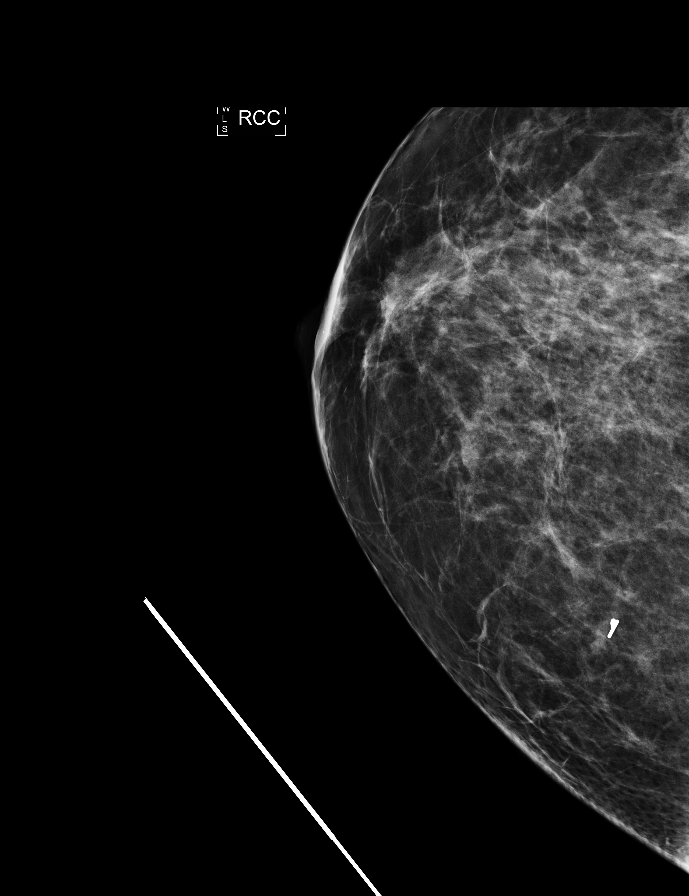

[8 of 8 positions shown; findings below may reference images not displayed]

FINDINGS: Patient presents for radioactive seed localization prior to right
breast lumpectomy. I met with the patient and we discussed the
procedure of seed localization including benefits and alternatives.
We discussed the high likelihood of a successful procedure. We
discussed the risks of the procedure including infection, bleeding,
tissue injury and further surgery. We discussed the low dose of
radioactivity involved in the procedure. Informed, written consent
was given.

The usual time-out protocol was performed immediately prior to the
procedure.

Using mammographic guidance, sterile technique, 1% lidocaine and an
G-1E0 radioactive seed, the biopsy marking clip in the upper inner
right breast was localized using a medial approach. The follow-up
mammogram images confirm the seed in the expected location and were
marked for Dr. Tore-Jan.

Follow-up survey of the patient confirms presence of the radioactive
seed.

Order number of G-1E0 seed:  [id number].

Total activity:  0.253 millicuries reference Date: 11/09/2018

The patient tolerated the procedure well and was released from the
[REDACTED]. She was given instructions regarding seed removal.
IMPRESSION: Radioactive seed localization right breast. No apparent
complications.

## 2020-12-02 IMAGING — CT CT ANGIOGRAPHY CHEST
2 of 6 series · 17 of 46 positions shown · IV contrast (Isovue)
Comparison: None.

CLINICAL DATA: Shortness of breath. Right-sided lumpectomy and left
mastectomy 11/22/2018.

EXAM:
CT ANGIOGRAPHY CHEST WITH CONTRAST
TECHNIQUE: Multidetector CT imaging of the chest was performed using the
standard protocol during bolus administration of intravenous
contrast. Multiplanar CT image reconstructions and MIPs were
obtained to evaluate the vascular anatomy.
CONTRAST:  100mL OMNIPAQUE IOHEXOL 350 MG/ML SOLN

[Series 5: thins · axial · 0.70mm/px · z∈[+1361,+1518]mm · 14 of 173 slices shown]
[im 8/173  lung]
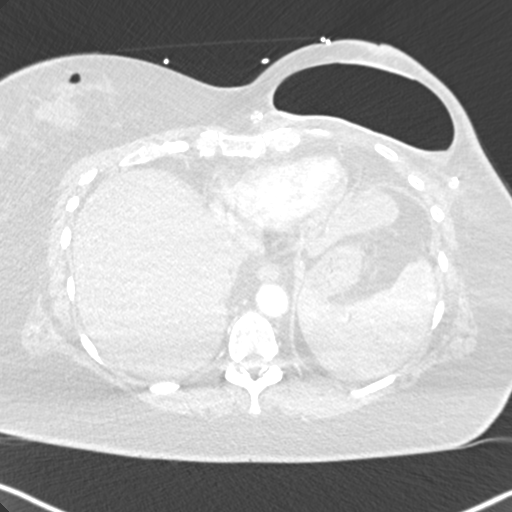
[im 23/173  soft-tissue]
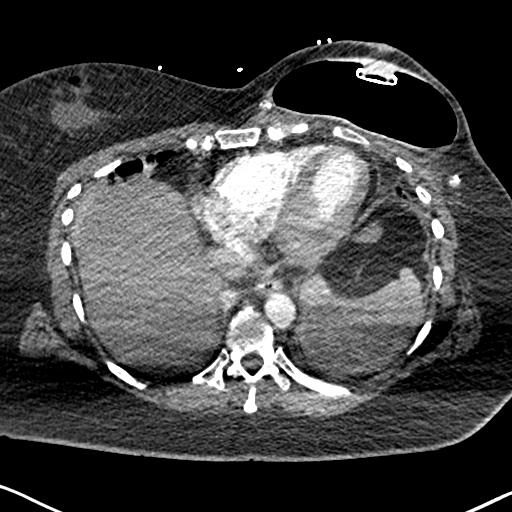
[im 30/173  lung]
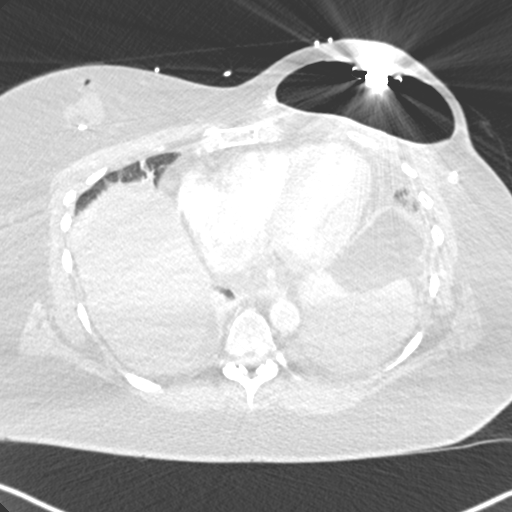
[im 45/173  soft-tissue]
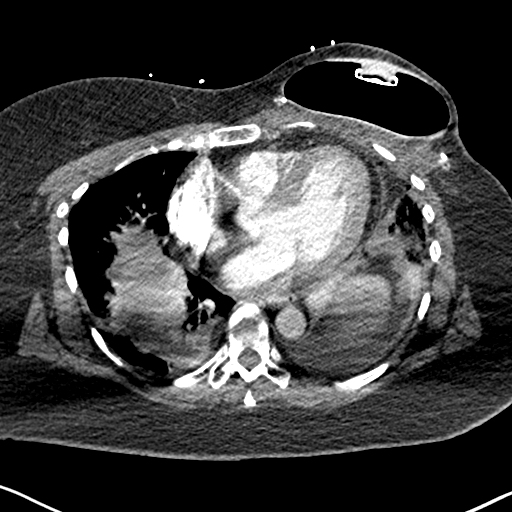
[im 60/173  lung]
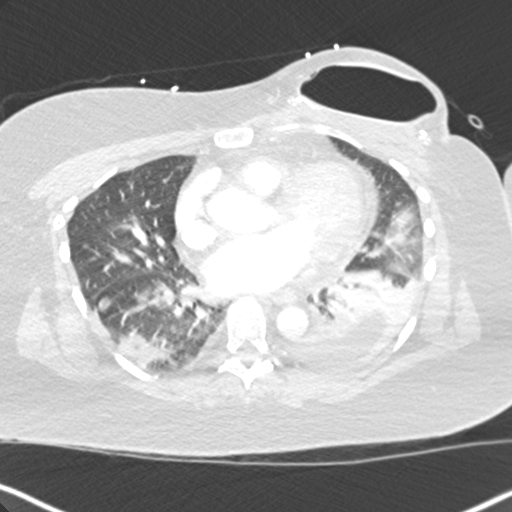
[im 68/173  soft-tissue]
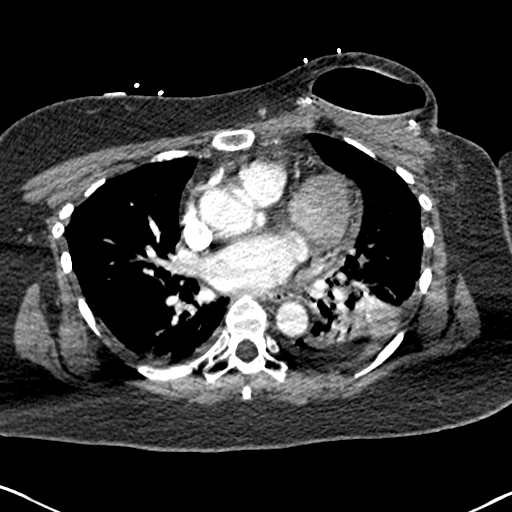
[im 83/173  lung]
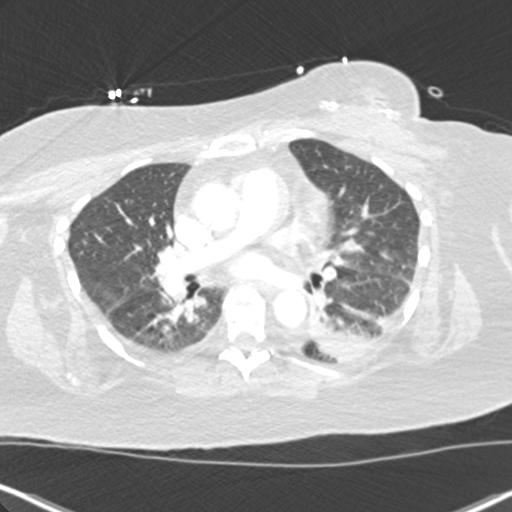
[im 90/173  soft-tissue]
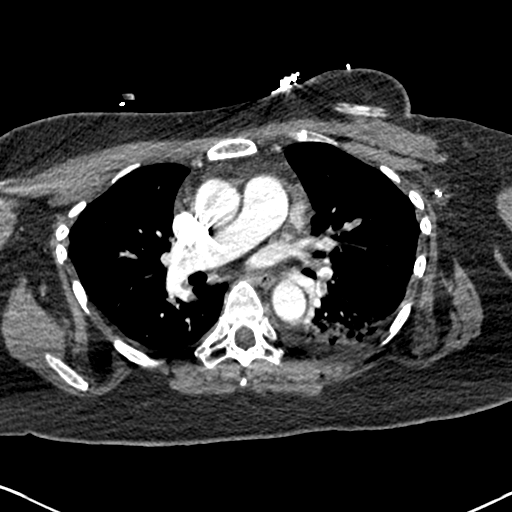
[im 105/173  lung]
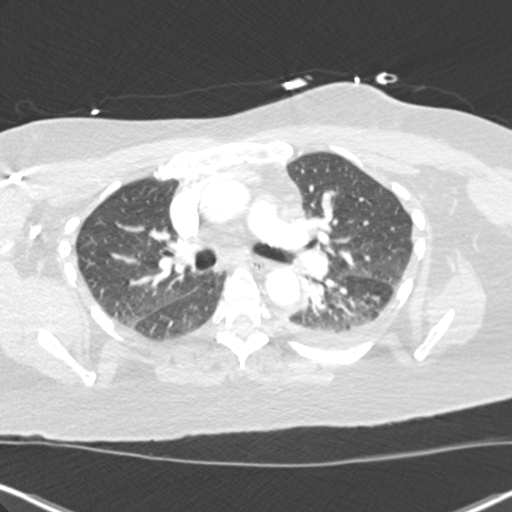
[im 113/173  soft-tissue]
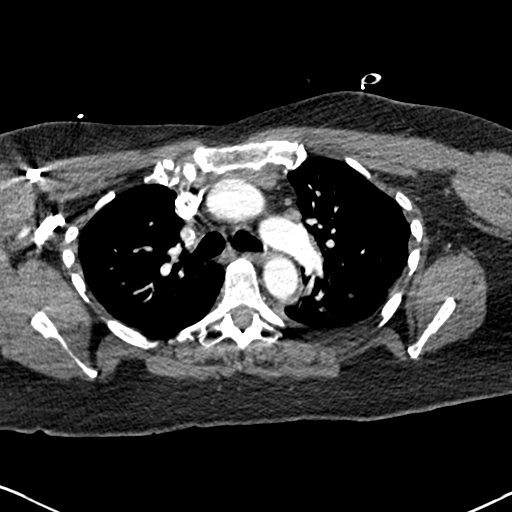
[im 128/173  lung]
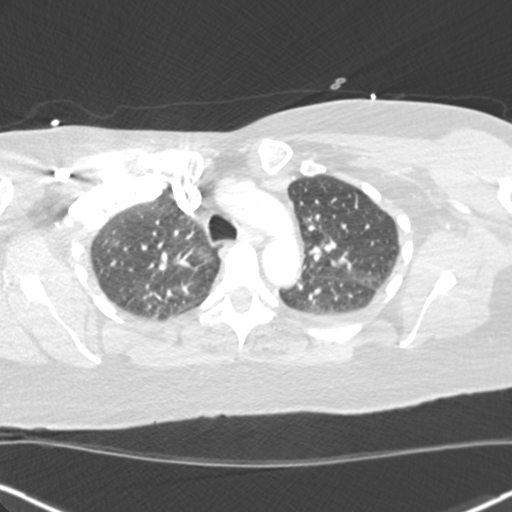
[im 143/173  soft-tissue]
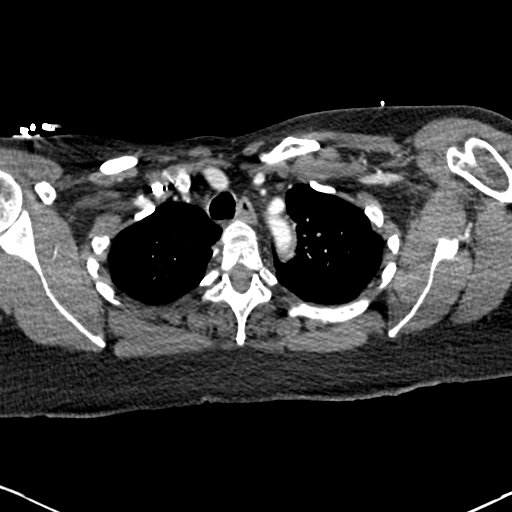
[im 150/173  lung]
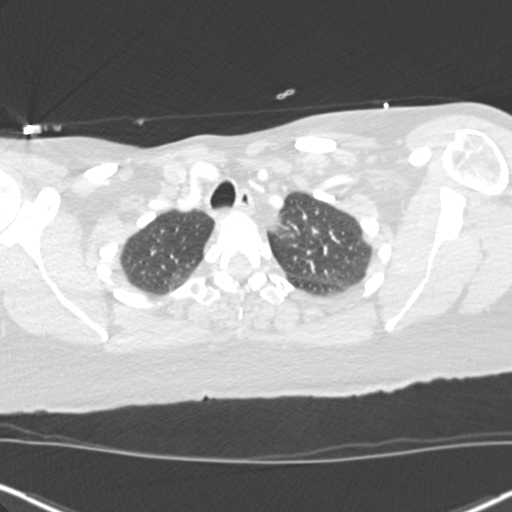
[im 165/173  soft-tissue]
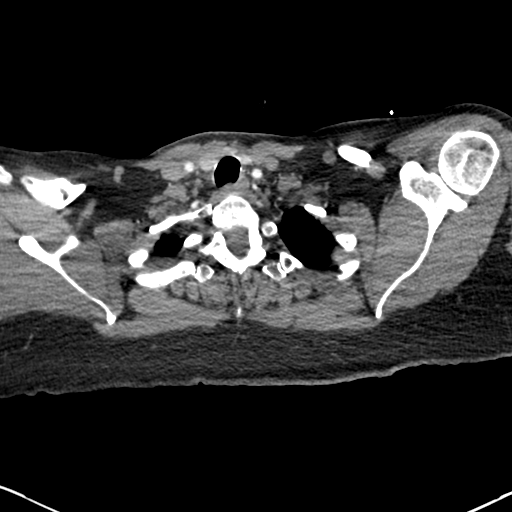

[Series 7: coronal mpr · coronal · 0.36mm/px · 3 of 151 slices shown]
[im 38/151  soft-tissue]
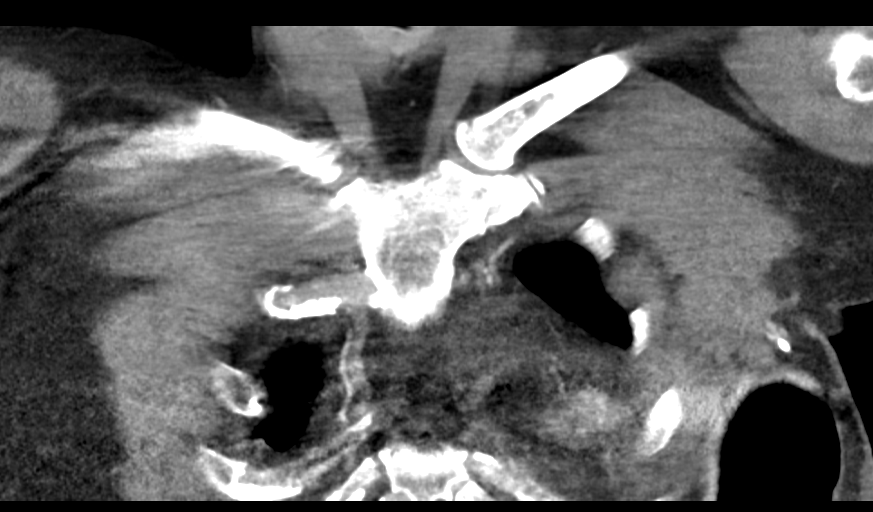
[im 76/151  soft-tissue]
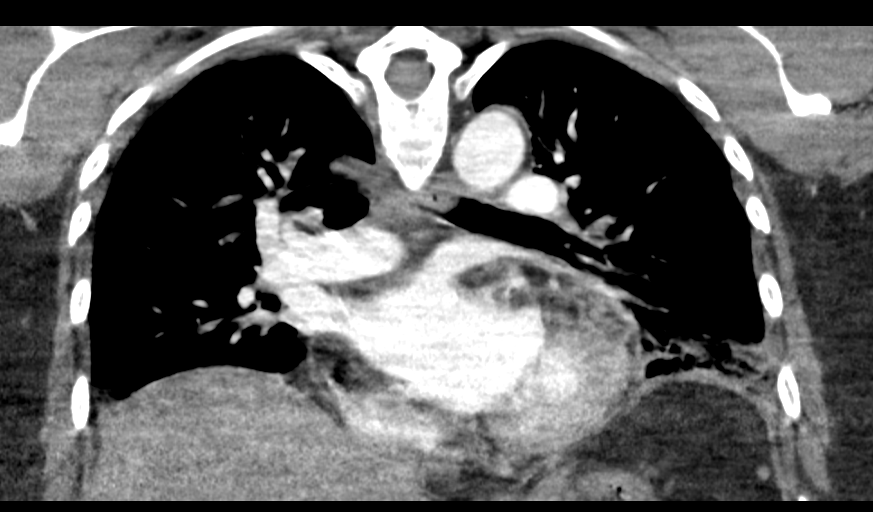
[im 113/151  soft-tissue]
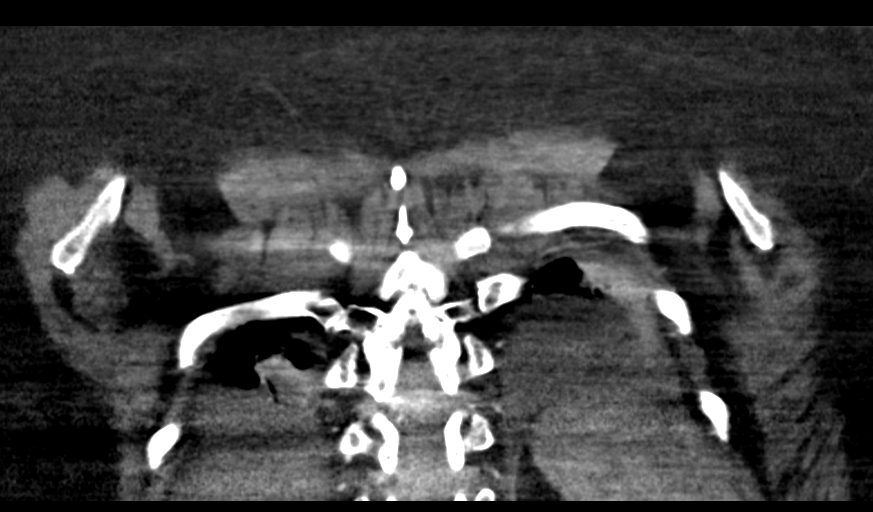

[17 of 46 positions shown; findings below may reference images not displayed]

FINDINGS: Cardiovascular: Multifocal central branching pulmonary artery
filling defects consistent with acute emboli, labeled on thin axial
series. All lobes are affected by subsegmental clot. Segmental clot
is seen within the left lower lobe. RV to LV ratio is between
and 0.9. Borderline heart size. No pericardial effusion. Negative
aorta.

Mediastinum/Nodes: Negative for adenopathy

Lungs/Pleura: Streaky opacities in the lower lungs consistent with
atelectasis. There is also ground-glass opacity in the lingula more
compatible with infarct. No pulmonary edema. Small left pleural
effusion.

Upper Abdomen: Negative

Musculoskeletal: No acute or aggressive finding.

Other: Changes of right lumpectomy and left-sided mastectomy with
tissue expander.

Critical Value/emergent results were called by telephone at the time
of interpretation on 12/03/2018 at [DATE] to Dr. IAN JEFFERSON ANDAL LUCIAN MIRCEA , who
verbally acknowledged these results.

Review of the MIP images confirms the above findings.
IMPRESSION: 1. Multifocal acute segmental and subsegmental pulmonary emboli.
Negative for right heart strain.
2. Atelectasis and lingular infarct.
3. Small left pleural effusion.

## 2020-12-13 ENCOUNTER — Ambulatory Visit: Payer: No Typology Code available for payment source | Admitting: Obstetrics and Gynecology

## 2021-03-12 ENCOUNTER — Other Ambulatory Visit: Payer: Self-pay

## 2021-03-12 ENCOUNTER — Encounter: Payer: Self-pay | Admitting: Obstetrics and Gynecology

## 2021-03-12 ENCOUNTER — Ambulatory Visit (INDEPENDENT_AMBULATORY_CARE_PROVIDER_SITE_OTHER): Payer: No Typology Code available for payment source | Admitting: Obstetrics and Gynecology

## 2021-03-12 VITALS — BP 150/94 | HR 97 | Ht 63.0 in | Wt 238.0 lb

## 2021-03-12 DIAGNOSIS — Z833 Family history of diabetes mellitus: Secondary | ICD-10-CM

## 2021-03-12 DIAGNOSIS — E559 Vitamin D deficiency, unspecified: Secondary | ICD-10-CM | POA: Diagnosis not present

## 2021-03-12 DIAGNOSIS — Z853 Personal history of malignant neoplasm of breast: Secondary | ICD-10-CM

## 2021-03-12 DIAGNOSIS — Z1211 Encounter for screening for malignant neoplasm of colon: Secondary | ICD-10-CM

## 2021-03-12 DIAGNOSIS — N951 Menopausal and female climacteric states: Secondary | ICD-10-CM

## 2021-03-12 DIAGNOSIS — Z6841 Body Mass Index (BMI) 40.0 and over, adult: Secondary | ICD-10-CM

## 2021-03-12 DIAGNOSIS — Z0000 Encounter for general adult medical examination without abnormal findings: Secondary | ICD-10-CM

## 2021-03-12 DIAGNOSIS — Z01419 Encounter for gynecological examination (general) (routine) without abnormal findings: Secondary | ICD-10-CM | POA: Diagnosis not present

## 2021-03-12 NOTE — Patient Instructions (Signed)

## 2021-03-12 NOTE — Progress Notes (Addendum)
55 y.o. [id number] Divorced White or Caucasian Not Hispanic or Latino female here for annual exam.  She has a live in boyfriend, together x 20 years. Not currently sexually active, he has health issues, she doesn't have a libido. No h/o dyspareunia.   H/o ER + left breast cancer in 2/20, s/p mastectomy, on anastrozole. She has joint pain from the anastrozole, energy level is low. She is getting lots of rest, trying to decrease her stress.  She saw Dr Donne Hazel in 3/22, for atypical lobular hyperplasia. He recommended she have a f/u mammogram this month.    She is frustrated about her weight, can't loose it. She is walking, some issues with plantar fascitis and knee pain. She is watching what she eats. Eats mainly chicken, [place], pork, vegetables and fruits. Limits her carbs. She feels she could work on portion control. She drinks water all day long.    She c/o hot flashes at night, worse in the last year. Waking up at least one time a night.  Patient's last menstrual period was 09/23/2017.          Sexually active: No.  The current method of family planning is post menopausal status.    Exercising: Yes.     Walking  Smoker:  no  Health Maintenance: Pap:  09/10/18 negative, neg HR HPV; 07/30/16 Neg  History of abnormal Pap:  no MMG:  up to date see epic had mammogram, then Mammogram with biopsy of the right breast in 2/22: atypical lobular hyperplasia.  BMD:   none  Colonoscopy: none  TDaP:  UTD Gardasil: NA   reports that she quit smoking about 27 years ago. Her smoking use included cigarettes. She has never used smokeless tobacco. She reports previous alcohol use. She reports that she does not use drugs. Just occasional ETOH. Son is 56, lives in [place]. No grandchildren. She works for a [employer].   Past Medical History:  Diagnosis Date   Basal cell carcinoma 12/22/2019   left ant nasal ala crease   Breast cancer ([place])    Family history of breast cancer    Pulmonary embolus ([place])  2020    Past Surgical History:  Procedure Laterality Date   AUGMENTATION MAMMAPLASTY Left 04/2019   BREAST BIOPSY Right 11/22/2018   atypical , negative breast cancer   BREAST RECONSTRUCTION WITH PLACEMENT OF TISSUE EXPANDER AND ALLODERM Left 11/22/2018   Procedure: LEFT BREAST RECONSTRUCTION WITH TISSUE EXPANDER AND ACELLULAR DERMIS;  Surgeon: [name], MD;  Location: [facility];  Service: Plastics;  Laterality: Left;   BREAST REDUCTION SURGERY Right 04/26/2019   Procedure: RIGHT BREAST REDUCTION;  Surgeon: [name], MD;  Location: [facility];  Service: Plastics;  Laterality: Right;   Broken Wrist  1999   CESAREAN SECTION     endometrial biopsy  10/15   LIPOSUCTION WITH LIPOFILLING Left 04/26/2019   Procedure: LIPOFILLING FROM ABDOMEN TO LEFT CHEST;  Surgeon: [name], MD;  Location: [facility];  Service: Plastics;  Laterality: Left;   MASTECTOMY Left 11/22/2018   total/complete with tissue expander and implant    MASTECTOMY W/ SENTINEL NODE BIOPSY Left 11/22/2018   Procedure: LEFT TOTAL MASTECTOMY WITH LEFT AXILLARY SENTINEL LYMPH NODE BIOPSY;  Surgeon: Rolm Bookbinder, MD;  Location: [facility];  Service: General;  Laterality: Left;   RADIOACTIVE SEED GUIDED EXCISIONAL BREAST BIOPSY Right 11/22/2018   Procedure: RIGHT BREAST RADIOACTIVE SEED GUIDED EXCISIONAL BIOPSY;  Surgeon: Rolm Bookbinder, MD;  Location:  [facility];  Service: General;  Laterality: Right;   REDUCTION MAMMAPLASTY Right 04/26/2019   REMOVAL OF TISSUE EXPANDER AND PLACEMENT OF IMPLANT Left 04/26/2019   Procedure: REMOVAL OF LEFT TISSUE EXPANDER AND PLACEMENT OF IMPLANT;  Surgeon: [name], MD;  Location: [facility];  Service: Plastics;  Laterality: Left;    Current Outpatient Medications  Medication Sig Dispense Refill   anastrozole (ARIMIDEX) 1 MG tablet Take 1 tablet (1 mg total) by mouth  daily. 90 tablet 4   calcium carbonate (OSCAL) 1500 (600 Ca) MG TABS tablet Take 1 tablet (1,500 mg total) by mouth 2 (two) times daily with a meal.  0   Glucos-Chondroit-Collag-Hyal (GLUCOSAMINE CHONDROIT-COLLAGEN PO) Take by mouth.     Multiple Vitamin (MULTIVITAMIN) tablet Take 1 tablet by mouth daily.       VITAMIN D PO Take 1 capsule by mouth daily.      No current facility-administered medications for this visit.    Family History  Problem Relation Age of Onset   Breast cancer Mother 64       died 65; "negative genetic testing"   Diabetes Father    Hypertension Father    Hydrocephalus Brother    Breast cancer Maternal Grandmother 8       d. 77   Breast cancer Paternal Grandmother 24       d. 11   Breast cancer Paternal Aunt 67   Breast cancer Paternal Aunt 71       "genetic testing negative"   Other Maternal Grandfather        WWII   Lymphoma Paternal Grandfather     Review of Systems  All other systems reviewed and are negative.  Exam:   BP (!) 150/94   Pulse 97   Ht 5\' 3"  (1.6 m)   Wt 238 lb (108 kg)   LMP 09/23/2017   SpO2 100%   BMI 42.16 kg/m   Weight change: @WEIGHTCHANGE @ Height:   Height: 5\' 3"  (160 cm)  Ht Readings from Last 3 Encounters:  03/12/21 5\' 3"  (1.6 m)  10/16/20 5' 3.5" (1.613 m)  02/27/20 5' 3.5" (1.613 m)    General appearance: alert, cooperative and appears stated age Head: Normocephalic, without obvious abnormality, atraumatic Neck: no adenopathy, supple, symmetrical, trachea midline and thyroid normal to inspection and palpation Lungs: clear to auscultation bilaterally Cardiovascular: regular rate and rhythm Breasts:  evidence of left mastectomy, breast implant, evidence of breast reconstruction on the right. No masses, no skin changes.  Abdomen: soft, non-tender; non distended,  no masses,  no organomegaly Extremities: extremities normal, atraumatic, no cyanosis or edema Skin: Skin color, texture, turgor normal. No rashes or  lesions Lymph nodes: Cervical, supraclavicular, and axillary nodes normal. No abnormal inguinal nodes palpated Neurologic: Grossly normal   Pelvic: External genitalia:  no lesions              Urethra:  normal appearing urethra with no masses, tenderness or lesions              Bartholins and Skenes: normal                 Vagina: normal appearing vagina with normal color and discharge, no lesions              Cervix: no lesions               Bimanual Exam:  Uterus:   no masses or tenderness  Adnexa: no mass, fullness, tenderness               Rectovaginal: Confirms               Anus:  normal sphincter tone, no lesions   1. Well woman exam Discussed breast self exam Discussed calcium and vit D intake No pap this year  2. History of left breast cancer Due for 6 month mammogram this month  3. Colon cancer screening - Cologuard  4. Vitamin D deficiency - VITAMIN D 25 Hydroxy (Vit-D Deficiency, Fractures)  5. BMI 40.0-44.9, adult (HCC) - Lipid panel - TSH - Hemoglobin A1c  6. Laboratory exam ordered as part of routine general medical examination - Lipid panel  7. Family history of diabetes mellitus - Hemoglobin A1c  8. Vasomotor symptoms due to menopause Most bothersome at night Discussed the option of trying gabapentin at hs, she will let me know if she wants to do this.   03/15/21 addendum: Result note sent to the patient in regards to her labs. Also mentioned her elevated BP at this visit and her last visit with Dr [name]. Recommended she establish care with a primary.

## 2021-03-13 ENCOUNTER — Telehealth: Payer: Self-pay | Admitting: *Deleted

## 2021-03-13 LAB — TSH: TSH: 0.84 mIU/L

## 2021-03-13 LAB — LIPID PANEL
Cholesterol: 209 mg/dL — ABNORMAL HIGH (ref <200)
HDL: 47 mg/dL — ABNORMAL LOW (ref >=50)
LDL Cholesterol (Calc): 134 mg/dL (calc) — ABNORMAL HIGH
Non-HDL Cholesterol (Calc): 162 mg/dL (calc) — ABNORMAL HIGH (ref <130)
Total CHOL/HDL Ratio: 4.4 (calc) (ref <5.0)
Triglycerides: 153 mg/dL — ABNORMAL HIGH (ref <150)

## 2021-03-13 LAB — HEMOGLOBIN A1C
Hgb A1c MFr Bld: 5.7 % of total Hgb — ABNORMAL HIGH (ref <5.7)
Mean Plasma Glucose: 117 mg/dL
eAG (mmol/L): 6.5 mmol/L

## 2021-03-13 LAB — VITAMIN D 25 HYDROXY (VIT D DEFICIENCY, FRACTURES): Vit D, 25-Hydroxy: 48 ng/mL (ref 30–100)

## 2021-03-13 NOTE — Telephone Encounter (Signed)
I called [facility] and spoke with triage nurse and was informed that someone at [facility] was sending the messages to the wrong nurse about patient imaging. Elmo Putt is Dr.[facility] nurse, the triage nurse told me she would send the message to correct nurse which is Elmo Putt and she will place the order at the breast center for the below imaging and the breast center will call to schedule.  I left message for patient to call to relay.

## 2021-03-13 NOTE — Telephone Encounter (Signed)
Patient informed with the below note.

## 2021-03-13 NOTE — Telephone Encounter (Signed)
-----   Message from Salvadore Dom, MD sent at 03/12/2021  3:52 PM EDT ----- This patient is supposed to have a 6 month mammogram of the right breast at the breast center this month, per Dr Donne Hazel. He doesn't want her to have the MRI that is ordered. .  She can't reach the right person in Dr Cristal Generous office to get the order sent. She never gets a call back. Can you please reach out to his office. She is getting frustrated and it is due this month.  Thanks, Sharee Pimple

## 2021-03-22 ENCOUNTER — Other Ambulatory Visit: Payer: Self-pay | Admitting: General Surgery

## 2021-03-22 DIAGNOSIS — Z09 Encounter for follow-up examination after completed treatment for conditions other than malignant neoplasm: Secondary | ICD-10-CM

## 2021-04-01 ENCOUNTER — Ambulatory Visit (INDEPENDENT_AMBULATORY_CARE_PROVIDER_SITE_OTHER): Payer: No Typology Code available for payment source | Admitting: Dermatology

## 2021-04-01 ENCOUNTER — Encounter: Payer: Self-pay | Admitting: Dermatology

## 2021-04-01 ENCOUNTER — Other Ambulatory Visit: Payer: Self-pay

## 2021-04-01 DIAGNOSIS — Z1283 Encounter for screening for malignant neoplasm of skin: Secondary | ICD-10-CM

## 2021-04-01 DIAGNOSIS — D229 Melanocytic nevi, unspecified: Secondary | ICD-10-CM

## 2021-04-01 DIAGNOSIS — Z85828 Personal history of other malignant neoplasm of skin: Secondary | ICD-10-CM

## 2021-04-01 DIAGNOSIS — L814 Other melanin hyperpigmentation: Secondary | ICD-10-CM

## 2021-04-01 DIAGNOSIS — L821 Other seborrheic keratosis: Secondary | ICD-10-CM

## 2021-04-01 DIAGNOSIS — L578 Other skin changes due to chronic exposure to nonionizing radiation: Secondary | ICD-10-CM

## 2021-04-01 DIAGNOSIS — L820 Inflamed seborrheic keratosis: Secondary | ICD-10-CM

## 2021-04-01 DIAGNOSIS — D1800 Hemangioma unspecified site: Secondary | ICD-10-CM

## 2021-04-01 NOTE — Patient Instructions (Signed)

## 2021-04-01 NOTE — Progress Notes (Signed)
   Follow-Up Visit   Subjective  Jennifer Harrison is a 55 y.o. female who presents for the following: Annual Exam (Hx BCC ). The patient presents for Total-Body Skin Exam (TBSE) for skin cancer screening and mole check. Patient has irritated skin lesions on the L neck and R inframammary that she would like checked today.   The following portions of the chart were reviewed this encounter and updated as appropriate:   Tobacco  Allergies  Meds  Problems  Med Hx  Surg Hx  Fam Hx      Review of Systems:  No other skin or systemic complaints except as noted in HPI or Assessment and Plan.  Objective  Well appearing patient in no apparent distress; mood and affect are within normal limits.  A full examination was performed including scalp, head, eyes, ears, nose, lips, neck, chest, axillae, abdomen, back, buttocks, bilateral upper extremities, bilateral lower extremities, hands, feet, fingers, toes, fingernails, and toenails. All findings within normal limits unless otherwise noted below.  L neck x 1, R inframammary x 1 (2) Erythematous keratotic or waxy stuck-on papule or plaque.   Assessment & Plan  Inflamed seborrheic keratosis L neck x 1, R inframammary x 1  Destruction of lesion - L neck x 1, R inframammary x 1 Complexity: simple   Destruction method: cryotherapy   Informed consent: discussed and consent obtained   Timeout:  patient name, date of birth, surgical site, and procedure verified Lesion destroyed using liquid nitrogen: Yes   Region frozen until ice ball extended beyond lesion: Yes   Outcome: patient tolerated procedure well with no complications   Post-procedure details: wound care instructions given    Lentigines - Scattered tan macules - Due to sun exposure - Benign-appering, observe - Recommend daily broad spectrum sunscreen SPF 30+ to sun-exposed areas, reapply every 2 hours as needed. - Call for any changes  Seborrheic Keratoses - Stuck-on, waxy,  tan-brown papules and/or plaques  - Benign-appearing - Discussed benign etiology and prognosis. - Observe - Call for any changes  Melanocytic Nevi - Tan-brown and/or pink-flesh-colored symmetric macules and papules - Benign appearing on exam today - Observation - Call clinic for new or changing moles - Recommend daily use of broad spectrum spf 30+ sunscreen to sun-exposed areas.   Hemangiomas - Red papules - Discussed benign nature - Observe - Call for any changes  Actinic Damage - Chronic condition, secondary to cumulative UV/sun exposure - diffuse scaly erythematous macules with underlying dyspigmentation - Recommend daily broad spectrum sunscreen SPF 30+ to sun-exposed areas, reapply every 2 hours as needed.  - Staying in the shade or wearing long sleeves, sun glasses (UVA+UVB protection) and wide brim hats (4-inch brim around the entire circumference of the hat) are also recommended for sun protection.  - Call for new or changing lesions.  History of Basal Cell Carcinoma of the Skin - L ant nasal ala crease - No evidence of recurrence today - Recommend regular full body skin exams - Recommend daily broad spectrum sunscreen SPF 30+ to sun-exposed areas, reapply every 2 hours as needed.  - Call if any new or changing lesions are noted between office visits  Skin cancer screening performed today.  Return in about 1 year (around 04/01/2022) for TBSE.  Luther Redo, CMA, am acting as scribe for Sarina Ser, MD . Documentation: I have reviewed the above documentation for accuracy and completeness, and I agree with the above.  Sarina Ser, MD

## 2021-04-21 NOTE — Progress Notes (Signed)
Jennifer Harrison  [phone] [phone]    ID: Jennifer Harrison DOB: 11/05/1965  MR#: [id number]  DXA#:[id number]  Patient Care Team: Patient, [name] (Inactive) as PCP - General (General Practice) Rolm Bookbinder, MD as Consulting Physician (General Surgery) Sheli Dorin, Virgie Dad, MD as Consulting Physician (Oncology) Eppie Gibson, MD as Attending Physician (Radiation Oncology) Rockwell Germany, RN as Oncology Nurse Navigator Mauro Kaufmann, RN as Oncology Nurse Navigator [name], MD as Consulting Physician (Plastic Surgery) OTHER MD:   CHIEF COMPLAINT: Estrogen receptor positive breast cancer (s/p left mastectomy)  CURRENT TREATMENT: Anastrozole   INTERVAL HISTORY: [name] returns today for follow-up of her estrogen receptor positive breast cancer.  She was started on anastrozole on 04/09/2019.  She tolerates this moderately well.  She does have hot flashes and they rarely wake her up at night.  Vaginal dryness is not an issue.  She does not have arthralgias or myalgias problems.  She is scheduled for short-term follow up right mammogram on 04/30/2021.   REVIEW OF SYSTEMS: [place] walks about 30 minutes most mornings.  She is concerned about her weight.  She has a good understanding of how to cut back on calories.  She keeps her garden which has been variously productive of tomatoes and corn, but everything else did not do so well partly because of weather and partly because of deer.  A detailed review of systems today was otherwise stable   COVID 19 VACCINATION STATUS: unvaccinated; had COVID OCT 2020   HISTORY OF CURRENT ILLNESS: From the original intake note:  Jennifer Harrison underwent one-year follow up bilateral diagnostic mammography with tomography for benign right breast calcifications at [facility] on 10/04/2018 showing: Breast Density Category B. There are new calcifications in the upper outer and retroareolar  left breast.. Magnification views confirm pleomorphic calcifications spanning approximately 10 cm anterior to posterior diameter, by 8 cm craniocaudal span by approximately 7 cm transverse diameter. While the majority of the calcifications are in the upper-outer quadrant, there are also suspicious calcifications in the central and medial retroareolar left breast, upper inner quadrant. No suspicious mass or distortion is identified in the left breast.   Accordingly on 10/08/2018 she proceeded to biopsies of the left breast areas in question. 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 Prognostic indicators significant for: estrogen receptor, 50% positive with moderate staining intensity and progesterone receptor, 50% positive with moderate-weak staining intensity. Proliferation marker Ki67 at <1%. HER2 negative (1+) by immunohistochemistry.   She underwent an additional left breast biopsy on the same day. The pathology from this retroareolar upper outer quadrant procedure showed ([id number]): ductal carcinoma in situ with calcifications, intermediate grade.  Prognostic panel was not obtained on this tissue.  Then, she underwent an ultrasound of bilateral axillae on 10/14/2018 showing no abnormalities within either axilla. No abnormal lymph nodes are identified.   Magnification views of the right breast show mammographically stable predominately punctate calcifications in the upper inner quadrant, a few of which demonstrate layering. There are additional scattered calcifications in the inner inner right breast. No suspicious mass or architectural distortion on the right.    Finally, she underwent a biopsy of the right breast area in question on 10/14/2018. The pathology from this procedure showed ([id number]): atypical ductal hyperplasia with calcifications, lobular neoplasia (atypical lobular  hyperplasia), and fibrocystic changes with calcifications.   The patient's subsequent history is  as detailed below.   PAST MEDICAL HISTORY: Past Medical History:  Diagnosis Date   Basal cell carcinoma 12/22/2019   left ant nasal ala crease   Breast cancer ([place])    Family history of breast cancer    Pulmonary embolus ([place]) 2020    PAST SURGICAL HISTORY: Past Surgical History:  Procedure Laterality Date   AUGMENTATION MAMMAPLASTY Left 04/2019   BREAST BIOPSY Right 11/22/2018   atypical , negative breast cancer   BREAST RECONSTRUCTION WITH PLACEMENT OF TISSUE EXPANDER AND ALLODERM Left 11/22/2018   Procedure: LEFT BREAST RECONSTRUCTION WITH TISSUE EXPANDER AND ACELLULAR DERMIS;  Surgeon: [name], MD;  Location: [facility];  Service: Plastics;  Laterality: Left;   BREAST REDUCTION SURGERY Right 04/26/2019   Procedure: RIGHT BREAST REDUCTION;  Surgeon: [name], MD;  Location: [facility];  Service: Plastics;  Laterality: Right;   Broken Wrist  1999   CESAREAN SECTION     endometrial biopsy  10/15   LIPOSUCTION WITH LIPOFILLING Left 04/26/2019   Procedure: LIPOFILLING FROM ABDOMEN TO LEFT CHEST;  Surgeon: [name], MD;  Location: [facility];  Service: Plastics;  Laterality: Left;   MASTECTOMY Left 11/22/2018   total/complete with tissue expander and implant    MASTECTOMY W/ SENTINEL NODE BIOPSY Left 11/22/2018   Procedure: LEFT TOTAL MASTECTOMY WITH LEFT AXILLARY SENTINEL LYMPH NODE BIOPSY;  Surgeon: Rolm Bookbinder, MD;  Location: [facility];  Service: General;  Laterality: Left;   RADIOACTIVE SEED GUIDED EXCISIONAL BREAST BIOPSY Right 11/22/2018   Procedure: RIGHT BREAST RADIOACTIVE SEED GUIDED EXCISIONAL BIOPSY;  Surgeon: Rolm Bookbinder, MD;  Location: [facility];  Service: General;  Laterality: Right;   REDUCTION MAMMAPLASTY Right 04/26/2019   REMOVAL OF TISSUE EXPANDER AND  PLACEMENT OF IMPLANT Left 04/26/2019   Procedure: REMOVAL OF LEFT TISSUE EXPANDER AND PLACEMENT OF IMPLANT;  Surgeon: [name], MD;  Location: [facility] [place];  Service: Plastics;  Laterality: Left;    FAMILY HISTORY: Family History  Problem Relation Age of Onset   Breast cancer Mother 51       died 46; "negative genetic testing"   Diabetes Father    Hypertension Father    Hydrocephalus Brother    Breast cancer Maternal Grandmother 71       d. 83   Breast cancer Paternal Grandmother 53       d. 45   Breast cancer Paternal Aunt 44   Breast cancer Paternal Aunt 51       "genetic testing negative"   Other Maternal Grandfather        WWII   Lymphoma Paternal Grandfather   Yzabelle's father died from heart complications following a myocardial infarction at age 14. Patients' mother died from metastatic breast cancer at age 35. The patient has 1 brother and 1 sister. Jolin's mother was diagnosed with breast cancer at age 39. Yvett's paternal grandmother, maternal grandmother, and two paternal aunts were diagnosed with breast cancer. Patient denies anyone in her family having ovarian, prostate, or pancreatic cancer. Maisy's paternal grandfather was diagnosed with lymphoma.    GYNECOLOGIC HISTORY:  Patient's last menstrual period was 09/23/2017. 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 She is divorced. She lives alone with her dog,  a Engineer, petroleum mix (150 lbs). Her significant other, [name], works for the [employer] at a water treatment facility. Jaydin has one child, [name], who is 32, lives in [place], and graduated from [employer] with a degree in [employer]. [name] is currently working a Secretary/administrator.  ADVANCED DIRECTIVES: Not in place. Kamaile was given the appropriate healthcare power of attorney form at the 10/20/2018 visit to fill out and return at her discretion   HEALTH MAINTENANCE: Social History   Tobacco Use   Smoking status: Former    Types: Cigarettes    Quit date: 09/08/1993    Years since quitting: 27.6   Smokeless tobacco: Never  Substance Use Topics   Alcohol use: Not Currently   Drug use: No    Colonoscopy: no  PAP: yes, 09/10/2018  Bone density: no   Allergies  Allergen Reactions   Sulfa Antibiotics Other (See Comments)    Pt unsure   Penicillins Rash    Did it involve swelling of the face/tongue/throat, SOB, or low BP? No Did it involve sudden or severe rash/hives, skin peeling, or any reaction on the inside of your mouth or nose? Yes Did you need to seek medical attention at a hospital or doctor's office? No When did it last happen?      unknown If all above answers are "NO", may proceed with cephalosporin use.     Current Outpatient Medications  Medication Sig Dispense Refill   anastrozole (ARIMIDEX) 1 MG tablet Take 1 tablet (1 mg total) by mouth daily. 90 tablet 4   calcium carbonate (OSCAL) 1500 (600 Ca) MG TABS tablet Take 1 tablet (1,500 mg total) by mouth 2 (two) times daily with a meal.  0   Glucos-Chondroit-Collag-Hyal (GLUCOSAMINE CHONDROIT-COLLAGEN PO) Take by mouth.     Multiple Vitamin (MULTIVITAMIN) tablet Take 1 tablet by mouth daily.       VITAMIN D PO Take 1 capsule by mouth daily.      No current facility-administered medications for this visit.    OBJECTIVE: white woman in no acute distress  Vitals:   04/22/21 1429  BP: 134/83  Pulse: 86  Resp: 18  Temp: 98.8 F (37.1 C)  SpO2: 97%      Body mass index is 42.62 kg/m.   Wt Readings from Last 3 Encounters:  04/22/21 240 lb 9.6 oz (109.1 kg)  03/12/21 238 lb (108 kg)  10/16/20 238 lb 8 oz (108.2 kg)     ECOG FS:1 - Symptomatic but completely ambulatory  Sclerae  unicteric, EOMs intact Wearing a mask No cervical or supraclavicular adenopathy Lungs no rales or rhonchi Heart regular rate and rhythm Abd soft, nontender, positive bowel sounds MSK no focal spinal tenderness, no upper extremity lymphedema Neuro: nonfocal, well oriented, appropriate affect Breasts: The right breast is status post reduction mammoplasty.  There are no findings of concern.  The left breast is status postmastectomy.  There is saline implant reconstruction.  There is no evidence of local recurrence.  Both axillae are benign   LAB RESULTS:  CMP     Component Value Date/Time   NA 142 04/22/2021 1413   NA 141 08/06/2017 0927   K 4.1 04/22/2021 1413   CL 102 04/22/2021 1413   CO2 29 04/22/2021 1413   GLUCOSE 95 04/22/2021 1413   BUN 12 04/22/2021 1413   BUN 9 08/06/2017 0927   CREATININE 0.80 04/22/2021 1413   CREATININE 0.80 10/20/2018 0831   CREATININE 0.74 07/30/2016  1049   CALCIUM 10.0 04/22/2021 1413   PROT 7.6 04/22/2021 1413   PROT 7.1 08/06/2017 0927   ALBUMIN 4.4 04/22/2021 1413   ALBUMIN 4.3 08/06/2017 0927   AST 27 04/22/2021 1413   AST 34 10/20/2018 0831   ALT 42 04/22/2021 1413   ALT 60 (H) 10/20/2018 0831   ALKPHOS 74 04/22/2021 1413   BILITOT 0.3 04/22/2021 1413   BILITOT <0.2 (L) 10/20/2018 0831   GFRNONAA >60 04/22/2021 1413   GFRNONAA >60 10/20/2018 0831   GFRAA >60 02/20/2020 1431   GFRAA >60 10/20/2018 0831    No results found for: TOTALPROTELP, ALBUMINELP, A1GS, A2GS, BETS, BETA2SER, GAMS, MSPIKE, SPEI  No results found for: KPAFRELGTCHN, LAMBDASER, KAPLAMBRATIO  Lab Results  Component Value Date   WBC 7.9 04/22/2021   NEUTROABS 5.5 04/22/2021   HGB 12.0 04/22/2021   HCT 35.3 (L) 04/22/2021   MCV 86.3 04/22/2021   PLT 340 04/22/2021   No results found for: LABCA2  No components found for: RNHAFB903  No results for input(s): INR in the last 168 hours.  No results found for: LABCA2  No results found for: YBF383  No  results found for: ANV916  No results found for: OMA004  No results found for: CA2729  No components found for: HGQUANT  No results found for: CEA1 / No results found for: CEA1   No results found for: AFPTUMOR  No results found for: CHROMOGRNA  No results found for: HGBA, HGBA2QUANT, HGBFQUANT, HGBSQUAN (Hemoglobinopathy evaluation)   No results found for: LDH  Lab Results  Component Value Date   IRON 65 07/30/2016   TIBC 439 07/30/2016   IRONPCTSAT 15 07/30/2016   (Iron and TIBC)  Lab Results  Component Value Date   FERRITIN 33 07/30/2016    Urinalysis    Component Value Date/Time   COLORURINE YELLOW 07/29/2013 1501   APPEARANCEUR CLEAR 07/29/2013 1501   LABSPEC 1.005 07/29/2013 1501   PHURINE 6.5 07/29/2013 1501   GLUCOSEU NEG 07/29/2013 1501   HGBUR NEG 07/29/2013 1501   BILIRUBINUR n 07/30/2016 0840   KETONESUR NEG 07/29/2013 1501   PROTEINUR n 07/30/2016 0840   PROTEINUR NEG 07/29/2013 1501   UROBILINOGEN negative 07/30/2016 0840   UROBILINOGEN 0.2 07/29/2013 1501   NITRITE n 07/30/2016 0840   NITRITE NEG 07/29/2013 1501   LEUKOCYTESUR Negative 07/30/2016 0840    STUDIES:  No results found.   ELIGIBLE FOR AVAILABLE RESEARCH PROTOCOL: no   ASSESSMENT: 55 y.o. [place], [place] status post left breast upper outer quadrant biopsy 10/08/2018 for a clinical [place] N0 invasive ductal carcinoma, grade 1, estrogen and progesterone receptor positive, HER-2 nonamplified, with an MIB-1 of less than 1%  (1) status post right breast upper inner quadrant biopsy 10/14/2018 showing atypical ductal hyperplasia and atypical lobular hyperplasia  (2) tamoxifen started neoadjuvantly 10/20/2018, stopped 11/26/2018 (a week prior to surgery) and not resumed postop due to development of DVT/PE  (3) status post left total mastectomy with sentinel lymph node sampling 11/22/2018 for a pT1a pN0, stage IA invasive ductal carcinoma, grade 1, with negative margins  (a) a total of 3 sentinel  and 2 intramammary lymph nodes removed  (b) immediate left expander placement followed by saline implant placement 04/26/2019  (4) status post right lumpectomy 11/22/2018 showing atypical ductal hyperplasia  (5) multifocal acute segmental and subsegmental pulmonary embolism documented by CT angiogram 12/03/2018; no hypotension, negative for right heart strain  (a) IV heparin 12/03/2018 through 12/06/2018  (b) started apixaban 12/06/2018, 10 mg  BID x 7 d, then 5 mg BID  (c) D-dimer 03/15/2019 = less than 0.27  (d) stopped apixaban 04/08/2019  (e) repeat d-dimer October 2020 WNL  (6) Genetic testing through [facility] Common Hereditary Cancers Panel completed on 12/03/2017 showed no deleterious mutations in: APC, ATM, AXIN2, BARD1, BMPR1A, BRCA1, BRCA2, BRIP1, CDH1, CDK4, CDKN2A (p14ARF), CDKN2A (p16INK4a), CHEK2, CTNNA1, DICER1, EPCAM*, GREM1*, KIT, MEN1, MLH1, MSH2, MSH3, MSH6, MUTYH, NBN, NF1, PALB2, PDGFRA, PMS2, POLD1, POLE, PTEN, RAD50, RAD51C, RAD51D, SDHB, SDHC, SDHD, SMAD4, SMARCA4, STK11, TP53, TSC1, TSC2, VHL The following genes were evaluated for sequence changes only: HOXB13*, NTHL1*, SDHA.  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  (7) started anastrozole 04/09/2019  (a) repeated [place] measurements confirm postmenopausal status  (b) bone density 11/23/2019 shows a T score of 0.6   PLAN: Gwyndolyn Saxon is now 2-1/2 years out from definitive surgery for her breast cancer with no evidence of disease recurrence.  This is very favorable.  She is tolerating anastrozole well and the plan will be to continue that a total of 5 years.  We had discussed the possibility of intensified screening, since she had significant concerns regarding recurrence.  She met with Dr. Donne Hazel recently and he was able to reassure her so at this point she is comfortable with yearly mammography which is of course the standard of care  She understands that taking  anastrozole also cut in half her risk of developing another breast cancer in the future  She will see [place] again in 1 year, after her mammogram next year.  She knows to call for any other issue that may develop before then  Total encounter time 20 minutes.*     Dakiyah Heinke, Virgie Dad, MD  04/22/21 2:59 PM Medical Oncology and Hematology [facility] [place], [place] [address] Tel. [phone]    Fax. [phone]    I, Wilburn Mylar, am acting as scribe for Dr. Virgie Dad. Benn Tarver.  I, Lurline Del MD, have reviewed the above documentation for accuracy and completeness, and I agree with the above.   *Total Encounter Time as defined by the Centers for Medicare and Medicaid Services includes, in addition to the face-to-face time of a patient visit (documented in the note above) non-face-to-face time: obtaining and reviewing outside history, ordering and reviewing medications, tests or procedures, care coordination (communications with other health care professionals or caregivers) and documentation in the medical record.

## 2021-04-22 ENCOUNTER — Other Ambulatory Visit: Payer: Self-pay

## 2021-04-22 ENCOUNTER — Inpatient Hospital Stay: Payer: No Typology Code available for payment source | Attending: Oncology | Admitting: Oncology

## 2021-04-22 ENCOUNTER — Inpatient Hospital Stay: Payer: No Typology Code available for payment source

## 2021-04-22 VITALS — BP 134/83 | HR 86 | Temp 98.8°F | Resp 18 | Ht 63.0 in | Wt 240.6 lb

## 2021-04-22 DIAGNOSIS — Z833 Family history of diabetes mellitus: Secondary | ICD-10-CM | POA: Diagnosis not present

## 2021-04-22 DIAGNOSIS — Z807 Family history of other malignant neoplasms of lymphoid, hematopoietic and related tissues: Secondary | ICD-10-CM | POA: Insufficient documentation

## 2021-04-22 DIAGNOSIS — Z8249 Family history of ischemic heart disease and other diseases of the circulatory system: Secondary | ICD-10-CM | POA: Insufficient documentation

## 2021-04-22 DIAGNOSIS — Z79811 Long term (current) use of aromatase inhibitors: Secondary | ICD-10-CM | POA: Insufficient documentation

## 2021-04-22 DIAGNOSIS — Z86711 Personal history of pulmonary embolism: Secondary | ICD-10-CM | POA: Diagnosis not present

## 2021-04-22 DIAGNOSIS — Z170 Estrogen receptor positive status [ER+]: Secondary | ICD-10-CM | POA: Diagnosis not present

## 2021-04-22 DIAGNOSIS — Z803 Family history of malignant neoplasm of breast: Secondary | ICD-10-CM | POA: Insufficient documentation

## 2021-04-22 DIAGNOSIS — Z79899 Other long term (current) drug therapy: Secondary | ICD-10-CM | POA: Insufficient documentation

## 2021-04-22 DIAGNOSIS — C50412 Malignant neoplasm of upper-outer quadrant of left female breast: Secondary | ICD-10-CM | POA: Diagnosis present

## 2021-04-22 DIAGNOSIS — Z87891 Personal history of nicotine dependence: Secondary | ICD-10-CM | POA: Diagnosis not present

## 2021-04-22 DIAGNOSIS — Z9012 Acquired absence of left breast and nipple: Secondary | ICD-10-CM | POA: Insufficient documentation

## 2021-04-22 DIAGNOSIS — I2699 Other pulmonary embolism without acute cor pulmonale: Secondary | ICD-10-CM

## 2021-04-22 LAB — COMPREHENSIVE METABOLIC PANEL
ALT: 42 U/L (ref 0–44)
AST: 27 U/L (ref 15–41)
Albumin: 4.4 g/dL (ref 3.5–5.0)
Alkaline Phosphatase: 74 U/L (ref 38–126)
Anion gap: 11 (ref 5–15)
BUN: 12 mg/dL (ref 6–20)
CO2: 29 mmol/L (ref 22–32)
Calcium: 10 mg/dL (ref 8.9–10.3)
Chloride: 102 mmol/L (ref 98–111)
Creatinine, Ser: 0.8 mg/dL (ref 0.44–1.00)
GFR, Estimated: >60 mL/min (ref >60)
Glucose, Bld: 95 mg/dL (ref 70–99)
Potassium: 4.1 mmol/L (ref 3.5–5.1)
Sodium: 142 mmol/L (ref 135–145)
Total Bilirubin: 0.3 mg/dL (ref 0.3–1.2)
Total Protein: 7.6 g/dL (ref 6.5–8.1)

## 2021-04-22 LAB — CBC WITH DIFFERENTIAL/PLATELET
Abs Immature Granulocytes: 0.02 10*3/uL (ref 0.00–0.07)
Basophils Absolute: 0 10*3/uL (ref 0.0–0.1)
Basophils Relative: 1 %
Eosinophils Absolute: 0.2 10*3/uL (ref 0.0–0.5)
Eosinophils Relative: 2 %
HCT: 35.3 % — ABNORMAL LOW (ref 36.0–46.0)
Hemoglobin: 12 g/dL (ref 12.0–15.0)
Immature Granulocytes: 0 %
Lymphocytes Relative: 19 %
Lymphs Abs: 1.5 10*3/uL (ref 0.7–4.0)
MCH: 29.3 pg (ref 26.0–34.0)
MCHC: 34 g/dL (ref 30.0–36.0)
MCV: 86.3 fL (ref 80.0–100.0)
Monocytes Absolute: 0.7 10*3/uL (ref 0.1–1.0)
Monocytes Relative: 9 %
Neutro Abs: 5.5 10*3/uL (ref 1.7–7.7)
Neutrophils Relative %: 69 %
Platelets: 340 10*3/uL (ref 150–400)
RBC: 4.09 MIL/uL (ref 3.87–5.11)
RDW: 12.1 % (ref 11.5–15.5)
WBC: 7.9 10*3/uL (ref 4.0–10.5)
nRBC: 0 % (ref 0.0–0.2)

## 2021-04-23 LAB — FOLLICLE STIMULATING HORMONE: FSH: 69.6 m[IU]/mL

## 2021-04-25 LAB — ESTRADIOL, ULTRA SENS: Estradiol, Sensitive: <2.5 pg/mL

## 2021-04-30 ENCOUNTER — Other Ambulatory Visit: Payer: Self-pay

## 2021-04-30 ENCOUNTER — Other Ambulatory Visit: Payer: Self-pay | Admitting: General Surgery

## 2021-04-30 ENCOUNTER — Ambulatory Visit
Admission: RE | Admit: 2021-04-30 | Discharge: 2021-04-30 | Disposition: A | Discharge status: Home / Self Care | Payer: No Typology Code available for payment source | Source: Ambulatory Visit | Attending: General Surgery | Admitting: General Surgery

## 2021-04-30 DIAGNOSIS — Z09 Encounter for follow-up examination after completed treatment for conditions other than malignant neoplasm: Secondary | ICD-10-CM

## 2021-09-30 ENCOUNTER — Telehealth: Payer: Self-pay | Admitting: *Deleted

## 2021-09-30 NOTE — Telephone Encounter (Signed)
This RN spoke with pt per her call stating she thought she would be seen every six months - her last visit here was Aug 2022 .She is presently scheduled for follow up in Sept 2023.  Of note she was seen by Dr Donne Hazel in Sept and every 6 month follow up was discussed and is noted in his dictation.  " I just feel better being seen every 6 months for now "  This RN made appt for March 2023 with pt during this phone discussion.

## 2021-10-23 ENCOUNTER — Other Ambulatory Visit: Payer: Self-pay

## 2021-10-23 MED ORDER — ANASTROZOLE 1 MG PO TABS: 1.0000 mg | ORAL_TABLET | Freq: Every day | ORAL | 0 refills | Status: DC

## 2021-11-01 ENCOUNTER — Ambulatory Visit
Admission: RE | Admit: 2021-11-01 | Discharge: 2021-11-01 | Disposition: A | Discharge status: Home / Self Care | Payer: No Typology Code available for payment source | Source: Ambulatory Visit | Attending: General Surgery | Admitting: General Surgery

## 2021-11-01 DIAGNOSIS — Z09 Encounter for follow-up examination after completed treatment for conditions other than malignant neoplasm: Secondary | ICD-10-CM

## 2021-11-19 ENCOUNTER — Encounter: Payer: Self-pay | Admitting: Hematology and Oncology

## 2021-11-19 ENCOUNTER — Inpatient Hospital Stay: Payer: No Typology Code available for payment source

## 2021-11-19 ENCOUNTER — Inpatient Hospital Stay
Payer: No Typology Code available for payment source | Attending: Hematology and Oncology | Admitting: Hematology and Oncology

## 2021-11-19 ENCOUNTER — Other Ambulatory Visit: Payer: Self-pay

## 2021-11-19 VITALS — BP 176/96 | HR 76 | Temp 97.7°F | Resp 16 | Ht 63.0 in | Wt 248.7 lb

## 2021-11-19 DIAGNOSIS — Z833 Family history of diabetes mellitus: Secondary | ICD-10-CM | POA: Insufficient documentation

## 2021-11-19 DIAGNOSIS — Z803 Family history of malignant neoplasm of breast: Secondary | ICD-10-CM | POA: Diagnosis not present

## 2021-11-19 DIAGNOSIS — Z780 Asymptomatic menopausal state: Secondary | ICD-10-CM

## 2021-11-19 DIAGNOSIS — I2699 Other pulmonary embolism without acute cor pulmonale: Secondary | ICD-10-CM

## 2021-11-19 DIAGNOSIS — M8580 Other specified disorders of bone density and structure, unspecified site: Secondary | ICD-10-CM

## 2021-11-19 DIAGNOSIS — Z87891 Personal history of nicotine dependence: Secondary | ICD-10-CM | POA: Diagnosis not present

## 2021-11-19 DIAGNOSIS — Z8249 Family history of ischemic heart disease and other diseases of the circulatory system: Secondary | ICD-10-CM | POA: Insufficient documentation

## 2021-11-19 DIAGNOSIS — Z807 Family history of other malignant neoplasms of lymphoid, hematopoietic and related tissues: Secondary | ICD-10-CM | POA: Diagnosis not present

## 2021-11-19 DIAGNOSIS — Z79811 Long term (current) use of aromatase inhibitors: Secondary | ICD-10-CM | POA: Diagnosis not present

## 2021-11-19 DIAGNOSIS — Z86711 Personal history of pulmonary embolism: Secondary | ICD-10-CM | POA: Insufficient documentation

## 2021-11-19 DIAGNOSIS — Z13820 Encounter for screening for osteoporosis: Secondary | ICD-10-CM | POA: Diagnosis not present

## 2021-11-19 DIAGNOSIS — C50412 Malignant neoplasm of upper-outer quadrant of left female breast: Secondary | ICD-10-CM

## 2021-11-19 DIAGNOSIS — Z170 Estrogen receptor positive status [ER+]: Secondary | ICD-10-CM | POA: Diagnosis present

## 2021-11-19 DIAGNOSIS — Z9012 Acquired absence of left breast and nipple: Secondary | ICD-10-CM | POA: Insufficient documentation

## 2021-11-19 DIAGNOSIS — Z79899 Other long term (current) drug therapy: Secondary | ICD-10-CM | POA: Diagnosis not present

## 2021-11-19 LAB — CBC WITH DIFFERENTIAL/PLATELET
Abs Immature Granulocytes: 0.05 10*3/uL (ref 0.00–0.07)
Basophils Absolute: 0.1 10*3/uL (ref 0.0–0.1)
Basophils Relative: 1 %
Eosinophils Absolute: 0.2 10*3/uL (ref 0.0–0.5)
Eosinophils Relative: 2 %
HCT: 37 % (ref 36.0–46.0)
Hemoglobin: 12.2 g/dL (ref 12.0–15.0)
Immature Granulocytes: 1 %
Lymphocytes Relative: 17 %
Lymphs Abs: 1.5 10*3/uL (ref 0.7–4.0)
MCH: 29.3 pg (ref 26.0–34.0)
MCHC: 33 g/dL (ref 30.0–36.0)
MCV: 88.9 fL (ref 80.0–100.0)
Monocytes Absolute: 0.7 10*3/uL (ref 0.1–1.0)
Monocytes Relative: 8 %
Neutro Abs: 6.6 10*3/uL (ref 1.7–7.7)
Neutrophils Relative %: 71 %
Platelets: 318 10*3/uL (ref 150–400)
RBC: 4.16 MIL/uL (ref 3.87–5.11)
RDW: 12 % (ref 11.5–15.5)
WBC: 9.1 10*3/uL (ref 4.0–10.5)
nRBC: 0 % (ref 0.0–0.2)

## 2021-11-19 LAB — COMPREHENSIVE METABOLIC PANEL
ALT: 40 U/L (ref 0–44)
AST: 25 U/L (ref 15–41)
Albumin: 4.7 g/dL (ref 3.5–5.0)
Alkaline Phosphatase: 70 U/L (ref 38–126)
Anion gap: 6 (ref 5–15)
BUN: 10 mg/dL (ref 6–20)
CO2: 32 mmol/L (ref 22–32)
Calcium: 9.8 mg/dL (ref 8.9–10.3)
Chloride: 103 mmol/L (ref 98–111)
Creatinine, Ser: 0.69 mg/dL (ref 0.44–1.00)
GFR, Estimated: >60 mL/min (ref >60)
Glucose, Bld: 95 mg/dL (ref 70–99)
Potassium: 3.9 mmol/L (ref 3.5–5.1)
Sodium: 141 mmol/L (ref 135–145)
Total Bilirubin: 0.2 mg/dL — ABNORMAL LOW (ref 0.3–1.2)
Total Protein: 7.4 g/dL (ref 6.5–8.1)

## 2021-11-19 NOTE — Progress Notes (Signed)
?Jennifer Harrison  ?[phone] [phone]  ? ? ?ID: Jennifer Harrison DOB: 1966-05-24  MR#: [id number]  EPP#:[id number] ? ?Patient Care Team: ?Patient, [name] (Inactive) as PCP - General (General Practice) ?Rolm Bookbinder, MD as Consulting Physician (General Surgery) ?Magrinat, Virgie Dad, MD (Inactive) as Consulting Physician (Oncology) ?Eppie Gibson, MD as Attending Physician (Radiation Oncology) ?Rockwell Germany, RN as Oncology Nurse Navigator ?Mauro Kaufmann, RN as Oncology Nurse Navigator ?[name], MD as Consulting Physician (Plastic Surgery) ?OTHER MD: ? ? ?CHIEF COMPLAINT: Estrogen receptor positive breast cancer (s/p left mastectomy) ? ?CURRENT TREATMENT: Anastrozole ? ?INTERVAL HISTORY: ?Jennifer Harrison returns today for follow-up of her estrogen receptor positive breast cancer. ? ?She was started on anastrozole on 04/09/2019.  ? ? ?REVIEW OF SYSTEMS: ?[place] walks about 30 minutes most mornings.  She is concerned about her weight.  She has a good understanding of how to cut back on calories.  She keeps her garden which has been variously productive of tomatoes and corn, but everything else did not do so well partly because of weather and partly because of deer.  A detailed review of systems today was otherwise stable ? ? COVID 19 VACCINATION STATUS: unvaccinated; had COVID OCT 2020 ? ? ?HISTORY OF CURRENT ILLNESS: ?From the original intake note: ? ?Jennifer Harrison underwent one-year follow up bilateral diagnostic mammography with tomography for benign right breast calcifications at [facility] on 10/04/2018 showing: Breast Density Category B. There are new calcifications in the upper outer and retroareolar left breast.. Magnification views confirm pleomorphic calcifications spanning approximately 10 cm anterior to posterior diameter, by 8 cm craniocaudal span by approximately 7 cm transverse diameter. While the majority of the calcifications are in the  upper-outer quadrant, there are also suspicious calcifications in the central and medial retroareolar left breast, upper inner quadrant. No suspicious mass or distortion is identified in the left breast.  ? ?Accordingly on 10/08/2018 she proceeded to biopsies of the left breast areas in question. 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 Prognostic indicators significant for: estrogen receptor, 50% positive with moderate staining intensity and progesterone receptor, 50% positive with moderate-weak staining intensity. Proliferation marker Ki67 at <1%. HER2 negative (1+) by immunohistochemistry.  ? ?She underwent an additional left breast biopsy on the same day. The pathology from this retroareolar upper outer quadrant procedure showed ([id number]): ductal carcinoma in situ with calcifications, intermediate grade.  Prognostic panel was not obtained on this tissue. ? ?Then, she underwent an ultrasound of bilateral axillae on 10/14/2018 showing no abnormalities within either axilla. No abnormal lymph nodes are identified.  ? ?Magnification views of the right breast show mammographically stable predominately punctate calcifications in the upper inner quadrant, a few of which demonstrate layering. There are additional scattered calcifications in the inner inner right breast. No suspicious mass or architectural distortion on the right.   ? ?Finally, she underwent a biopsy of the right breast area in question on 10/14/2018. The pathology from this procedure showed ([id number]): atypical ductal hyperplasia with calcifications, lobular neoplasia (atypical lobular hyperplasia), and fibrocystic changes with calcifications.  ? ?The patient's subsequent history is as detailed below. ? ? ?PAST MEDICAL HISTORY: ?Past Medical History:  ?Diagnosis Date  ? Basal cell carcinoma 12/22/2019  ? left ant nasal ala crease  ?  Breast cancer ([place])   ? Family history of breast cancer   ? Pulmonary embolus ([place]) 2020  ? ? ?  PAST SURGICAL HISTORY: ?Past Surgical History:  ?Procedure Laterality Date  ? AUGMENTATION MAMMAPLASTY Left 04/2019  ? BREAST BIOPSY Right 11/22/2018  ? atypical , negative breast cancer  ? BREAST BIOPSY Right 10/12/2020  ? BREAST RECONSTRUCTION WITH PLACEMENT OF TISSUE EXPANDER AND ALLODERM Left 11/22/2018  ? Procedure: LEFT BREAST RECONSTRUCTION WITH TISSUE EXPANDER AND ACELLULAR DERMIS;  Surgeon: [name], MD;  Location: [place];  Service: Plastics;  Laterality: Left;  ? BREAST REDUCTION SURGERY Right 04/26/2019  ? Procedure: RIGHT BREAST REDUCTION;  Surgeon: [name], MD;  Location: [place];  Service: Plastics;  Laterality: Right;  ? Broken Wrist  1999  ? CESAREAN SECTION    ? endometrial biopsy  06/2014  ? LIPOSUCTION WITH LIPOFILLING Left 04/26/2019  ? Procedure: LIPOFILLING FROM ABDOMEN TO LEFT CHEST;  Surgeon: [name], MD;  Location: [place];  Service: Plastics;  Laterality: Left;  ? MASTECTOMY Left 11/22/2018  ? total/complete with tissue expander and implant   ? MASTECTOMY W/ SENTINEL NODE BIOPSY Left 11/22/2018  ? Procedure: LEFT TOTAL MASTECTOMY WITH LEFT AXILLARY SENTINEL LYMPH NODE BIOPSY;  Surgeon: Rolm Bookbinder, MD;  Location: [place];  Service: General;  Laterality: Left;  ? RADIOACTIVE SEED GUIDED EXCISIONAL BREAST BIOPSY Right 11/22/2018  ? Procedure: RIGHT BREAST RADIOACTIVE SEED GUIDED EXCISIONAL BIOPSY;  Surgeon: Rolm Bookbinder, MD;  Location: [place];  Service: General;  Laterality: Right;  ? REDUCTION MAMMAPLASTY Right 04/26/2019  ? REMOVAL OF TISSUE EXPANDER AND PLACEMENT OF IMPLANT Left 04/26/2019  ? Procedure: REMOVAL OF LEFT TISSUE EXPANDER AND PLACEMENT OF IMPLANT;  Surgeon: [name], MD;  Location: [place];  Service: Plastics;  Laterality:  Left;  ? ? ?FAMILY HISTORY: ?Family History  ?Problem Relation Age of Onset  ? Breast cancer Mother 66  ?     died 46; "negative genetic testing"  ? Diabetes Father   ? Hypertension Father   ? Hydrocephalus Brother   ? Breast cancer Maternal Grandmother 68  ?     d. 96  ? Breast cancer Paternal Grandmother 56  ?     d. 23  ? Breast cancer Paternal Aunt 71  ? Breast cancer Paternal Aunt 71  ?     "genetic testing negative"  ? Other Maternal Grandfather   ?     WWII  ? Lymphoma Paternal Grandfather   ?Azora's father died from heart complications following a myocardial infarction at age 48. Patients' mother died from metastatic breast cancer at age 62. The patient has 1 brother and 1 sister. Darris's mother was diagnosed with breast cancer at age 34. Shatonya's paternal grandmother, maternal grandmother, and two paternal aunts were diagnosed with breast cancer. Patient denies anyone in her family having ovarian, prostate, or pancreatic cancer. Maye's paternal grandfather was diagnosed with lymphoma.  ? ? ?GYNECOLOGIC HISTORY:  ?Patient's last menstrual period was 09/23/2017. ?Menarche: 56 years old ?Age at first live birth: 56 years old ?GX P: 1 ?LMP: 09/17/2018 ?Contraceptive: yes, about 5 years ?HRT:   ?Hysterectomy?: no ?BSO?: no ? ? 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 She is divorced. She lives alone with her dog, a Engineer, petroleum mix (150 lbs). Her significant other, [name], works for the [employer] at a water treatment facility. Sabriel has one child, [name], who is 36, lives in [place], and graduated from [employer] with a degree in [employer]. Gunner  is currently working a Insurance claims handler. ? ?ADVANCED DIRECTIVES: Not in place. Eloyce was given the appropriate healthcare power of attorney form at the 10/20/2018 visit to fill out and return at her discretion ? ? ?HEALTH MAINTENANCE: ?Social History   ? ?Tobacco Use  ? Smoking status: Former  ?  Types: Cigarettes  ?  Quit date: 09/08/1993  ?  Years since quitting: 28.2  ? Smokeless tobacco: Never  ?Substance Use Topics  ? Alcohol use: Not Currently  ? Drug use: No

## 2021-11-19 NOTE — Progress Notes (Signed)
?Jennifer Harrison  ?Telephone:(336) [phone] [phone]  ? ? ?ID: [name] DOB: 1965-11-11  MR#: [id number]  IOE#:[id number] ? ?Patient Care Team: ?Patient, [name] (Inactive) as PCP - General (General Practice) ?Rolm Bookbinder, MD as Consulting Physician (General Surgery) ?Magrinat, Virgie Dad, MD (Inactive) as Consulting Physician (Oncology) ?Eppie Gibson, MD as Attending Physician (Radiation Oncology) ?Rockwell Germany, RN as Oncology Nurse Navigator ?Mauro Kaufmann, RN as Oncology Nurse Navigator ?[name], MD as Consulting Physician (Plastic Surgery) ?OTHER MD: ? ? ?CHIEF COMPLAINT: Estrogen receptor positive breast cancer (s/p left mastectomy) ? ?CURRENT TREATMENT: Anastrozole ? ? ?INTERVAL HISTORY: ?Jennifer Harrison returns today for follow-up of her estrogen receptor positive breast cancer. ?She was started on anastrozole on 04/09/2019.  She tolerates this moderately well.  She does have hot flashes and they rarely wake her up at night. She doesn't think that she needs any medication for hot flashes ?Mammogram right breast Feb 2023, no change, stable appearance ?Bone density 11/2019, normal. ? ? ? ?REVIEW OF SYSTEMS: ?Orla walks about 30 minutes most mornings.  She is concerned about her weight.  She has a good understanding of how to cut back on calories.  She keeps her garden which has been variously productive of tomatoes and corn, but everything else did not do so well partly because of weather and partly because of deer.  A detailed review of systems today was otherwise stable ? ? COVID 19 VACCINATION STATUS: unvaccinated; had COVID OCT 2020 ? ? ?HISTORY OF CURRENT ILLNESS: ?From the original intake note: ? ?[name] underwent one-year follow up bilateral diagnostic mammography with tomography for benign right breast calcifications at [facility] on 10/04/2018 showing: Breast Density Category B. There are new calcifications in the upper outer and  retroareolar left breast.. Magnification views confirm pleomorphic calcifications spanning approximately 10 cm anterior to posterior diameter, by 8 cm craniocaudal span by approximately 7 cm transverse diameter. While the majority of the calcifications are in the upper-outer quadrant, there are also suspicious calcifications in the central and medial retroareolar left breast, upper inner quadrant. No suspicious mass or distortion is identified in the left breast.  ? ?Accordingly on 10/08/2018 she proceeded to biopsies of the left breast areas in question. 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 Prognostic indicators significant for: estrogen receptor, 50% positive with moderate staining intensity and progesterone receptor, 50% positive with moderate-weak staining intensity. Proliferation marker Ki67 at <1%. HER2 negative (1+) by immunohistochemistry.  ? ?She underwent an additional left breast biopsy on the same day. The pathology from this retroareolar upper outer quadrant procedure showed ([id number]): ductal carcinoma in situ with calcifications, intermediate grade.  Prognostic panel was not obtained on this tissue. ? ?Then, she underwent an ultrasound of bilateral axillae on 10/14/2018 showing no abnormalities within either axilla. No abnormal lymph nodes are identified.  ? ?Magnification views of the right breast show mammographically stable predominately punctate calcifications in the upper inner quadrant, a few of which demonstrate layering. There are additional scattered calcifications in the inner inner right breast. No suspicious mass or architectural distortion on the right.   ? ?Finally, she underwent a biopsy of the right breast area in question on 10/14/2018. The pathology from this procedure showed ([id number]): atypical ductal hyperplasia with calcifications, lobular neoplasia (atypical  lobular hyperplasia), and fibrocystic changes with calcifications.  ? ?The patient's subsequent history is as detailed below. ? ? ?  PAST MEDICAL HISTORY: ?Past Medical History:  ?Diagnosis Date  ? Basal cell carcinoma 12/22/2019  ? left ant nasal ala crease  ? Breast cancer ([place])   ? Family history of breast cancer   ? Pulmonary embolus ([place]) 2020  ? ? ?PAST SURGICAL HISTORY: ?Past Surgical History:  ?Procedure Laterality Date  ? AUGMENTATION MAMMAPLASTY Left 04/2019  ? BREAST BIOPSY Right 11/22/2018  ? atypical , negative breast cancer  ? BREAST BIOPSY Right 10/12/2020  ? BREAST RECONSTRUCTION WITH PLACEMENT OF TISSUE EXPANDER AND ALLODERM Left 11/22/2018  ? Procedure: LEFT BREAST RECONSTRUCTION WITH TISSUE EXPANDER AND ACELLULAR DERMIS;  Surgeon: [name], MD;  Location: [place];  Service: Plastics;  Laterality: Left;  ? BREAST REDUCTION SURGERY Right 04/26/2019  ? Procedure: RIGHT BREAST REDUCTION;  Surgeon: [name], MD;  Location: [place];  Service: Plastics;  Laterality: Right;  ? Broken Wrist  1999  ? CESAREAN SECTION    ? endometrial biopsy  06/2014  ? LIPOSUCTION WITH LIPOFILLING Left 04/26/2019  ? Procedure: LIPOFILLING FROM ABDOMEN TO LEFT CHEST;  Surgeon: [name], MD;  Location: [place];  Service: Plastics;  Laterality: Left;  ? MASTECTOMY Left 11/22/2018  ? total/complete with tissue expander and implant   ? MASTECTOMY W/ SENTINEL NODE BIOPSY Left 11/22/2018  ? Procedure: LEFT TOTAL MASTECTOMY WITH LEFT AXILLARY SENTINEL LYMPH NODE BIOPSY;  Surgeon: Rolm Bookbinder, MD;  Location: [place];  Service: General;  Laterality: Left;  ? RADIOACTIVE SEED GUIDED EXCISIONAL BREAST BIOPSY Right 11/22/2018  ? Procedure: RIGHT BREAST RADIOACTIVE SEED GUIDED EXCISIONAL BIOPSY;  Surgeon: Rolm Bookbinder, MD;  Location: [place];  Service: General;  Laterality: Right;  ? REDUCTION MAMMAPLASTY Right  04/26/2019  ? REMOVAL OF TISSUE EXPANDER AND PLACEMENT OF IMPLANT Left 04/26/2019  ? Procedure: REMOVAL OF LEFT TISSUE EXPANDER AND PLACEMENT OF IMPLANT;  Surgeon: [name], MD;  Location: [place];  Service: Plastics;  Laterality: Left;  ? ? ?FAMILY HISTORY: ?Family History  ?Problem Relation Age of Onset  ? Breast cancer Mother 75  ?     died 10; "negative genetic testing"  ? Diabetes Father   ? Hypertension Father   ? Hydrocephalus Brother   ? Breast cancer Maternal Grandmother 68  ?     d. 28  ? Breast cancer Paternal Grandmother 10  ?     d. 80  ? Breast cancer Paternal Aunt 71  ? Breast cancer Paternal Aunt 71  ?     "genetic testing negative"  ? Other Maternal Grandfather   ?     WWII  ? Lymphoma Paternal Grandfather   ?Yeily's father died from heart complications following a myocardial infarction at age 60. Patients' mother died from metastatic breast cancer at age 61. The patient has 1 brother and 1 sister. Airianna's mother was diagnosed with breast cancer at age 39. Sharalee's paternal grandmother, maternal grandmother, and two paternal aunts were diagnosed with breast cancer. Patient denies anyone in her family having ovarian, prostate, or pancreatic cancer. Shannin's paternal grandfather was diagnosed with lymphoma.  ? ? ?GYNECOLOGIC HISTORY:  ?Patient's last menstrual period was 09/23/2017. ?Menarche: 56 years old ?Age at first live birth: 56 years old ?GX P: 1 ?LMP: 09/17/2018 ?Contraceptive: yes, about 5 years ?HRT:   ?Hysterectomy?: no ?BSO?: no ? ? 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 She lives alone with her  dog, a Engineer, petroleum mix (150 lbs). Her significant other, [name], works for the [employer] at a water treatment facility. Sausha has one child, [name], who is 21, lives in [place], and graduated from [employer] with a degree in [employer]. [name] is  currently working a Insurance claims handler. ? ?ADVANCED DIRECTIVES: Not in place. Taimi was given the appropriate healthcare power of attorney form at the 10/20/2018 visit to fill out and return at her discretio

## 2021-11-20 LAB — FOLLICLE STIMULATING HORMONE: FSH: 80.1 m[IU]/mL

## 2021-11-27 LAB — ESTRADIOL, ULTRA SENS: Estradiol, Sensitive: <2.5 pg/mL

## 2022-01-20 ENCOUNTER — Other Ambulatory Visit: Payer: Self-pay | Admitting: Hematology and Oncology

## 2022-02-19 ENCOUNTER — Telehealth: Payer: Self-pay | Admitting: Hematology and Oncology

## 2022-02-19 NOTE — Telephone Encounter (Signed)
Rescheduled appointment per providers template. Left message.  ? ?

## 2022-03-12 NOTE — Progress Notes (Signed)
56 y.o. G61P1001 Divorced White or Caucasian Not Hispanic or Latino female here for annual exam.  No vaginal bleeding. Lives with her long term partner. No libido, not sexually active. Both okay with not being active.    H/o ER + left breast cancer in 2/20, s/p mastectomy, she had a PE on tamoxifen, currently on anastrozole. She is having vasomotor symptoms at night, she wakes up with hot flashes.   No bowel or bladder c/o.   At the end of March she changed her diet, cut out sugar. She is she is down 15 lbs.   Patient's last menstrual period was 07/14/2017.          Sexually active: No.  The current method of family planning is post menopausal status.    Exercising: Yes.     Walking  Smoker:  no  Health Maintenance: Pap:   09/10/18 negative, neg HR HPV; 07/30/16 Neg  History of abnormal Pap:  no MMG:  11/01/21 density C  Bi-rads 3 probably benign  BMD:   scheduled for September  Colonoscopy: none  TDaP unsure     Gardasil: N/A   reports that she quit smoking about 28 years ago. Her smoking use included cigarettes. She has never used smokeless tobacco. She reports that she does not currently use alcohol. She reports that she does not use drugs. Just occasional ETOH. She works for a [employer]. Son is 3, lives in [place], not married.   Past Medical History:  Diagnosis Date   Basal cell carcinoma 12/22/2019   left ant nasal ala crease   Breast cancer ([place])    Family history of breast cancer    Pulmonary embolus ([place]) 2020    Past Surgical History:  Procedure Laterality Date   AUGMENTATION MAMMAPLASTY Left 04/2019   BREAST BIOPSY Right 11/22/2018   atypical , negative breast cancer   BREAST BIOPSY Right 10/12/2020   BREAST RECONSTRUCTION WITH PLACEMENT OF TISSUE EXPANDER AND ALLODERM Left 11/22/2018   Procedure: LEFT BREAST RECONSTRUCTION WITH TISSUE EXPANDER AND ACELLULAR DERMIS;  Surgeon: [name], MD;  Location: [place];  Service: Plastics;   Laterality: Left;   BREAST REDUCTION SURGERY Right 04/26/2019   Procedure: RIGHT BREAST REDUCTION;  Surgeon: [name], MD;  Location: [place];  Service: Plastics;  Laterality: Right;   Broken Wrist  1999   CESAREAN SECTION     endometrial biopsy  06/2014   LIPOSUCTION WITH LIPOFILLING Left 04/26/2019   Procedure: LIPOFILLING FROM ABDOMEN TO LEFT CHEST;  Surgeon: [name], MD;  Location: [place];  Service: Plastics;  Laterality: Left;   MASTECTOMY Left 11/22/2018   total/complete with tissue expander and implant    MASTECTOMY W/ SENTINEL NODE BIOPSY Left 11/22/2018   Procedure: LEFT TOTAL MASTECTOMY WITH LEFT AXILLARY SENTINEL LYMPH NODE BIOPSY;  Surgeon: Rolm Bookbinder, MD;  Location: [place];  Service: General;  Laterality: Left;   RADIOACTIVE SEED GUIDED EXCISIONAL BREAST BIOPSY Right 11/22/2018   Procedure: RIGHT BREAST RADIOACTIVE SEED GUIDED EXCISIONAL BIOPSY;  Surgeon: Rolm Bookbinder, MD;  Location: [place];  Service: General;  Laterality: Right;   REDUCTION MAMMAPLASTY Right 04/26/2019   REMOVAL OF TISSUE EXPANDER AND PLACEMENT OF IMPLANT Left 04/26/2019   Procedure: REMOVAL OF LEFT TISSUE EXPANDER AND PLACEMENT OF IMPLANT;  Surgeon: [name], MD;  Location: [place];  Service: Plastics;  Laterality: Left;    Current Outpatient Medications  Medication Sig Dispense Refill  anastrozole (ARIMIDEX) 1 MG tablet TAKE 1 TABLET BY MOUTH EVERY DAY 30 tablet 2   calcium carbonate (OSCAL) 1500 (600 Ca) MG TABS tablet Take 1 tablet (1,500 mg total) by mouth 2 (two) times daily with a meal.  0   Multiple Vitamin (MULTIVITAMIN) tablet Take 1 tablet by mouth daily.       VITAMIN D PO Take 1 capsule by mouth daily.      No current facility-administered medications for this visit.    Family History  Problem Relation Age of Onset   Breast cancer Mother 24       died 50;  "negative genetic testing"   Diabetes Father    Hypertension Father    Hydrocephalus Brother    Breast cancer Maternal Grandmother 41       d. 7   Breast cancer Paternal Grandmother 81       d. 59   Breast cancer Paternal Aunt 46   Breast cancer Paternal Aunt 71       "genetic testing negative"   Other Maternal Grandfather        WWII   Lymphoma Paternal Grandfather     Review of Systems  All other systems reviewed and are negative.   Exam:   BP 138/80   Pulse 79   Ht '5\' 3"'$  (1.6 m)   Wt 236 lb (107 kg)   LMP 07/14/2017 Comment: was on BCP until 09-2018  SpO2 96%   BMI 41.81 kg/m   Weight change: '@WEIGHTCHANGE'$ @ Height:   Height: '5\' 3"'$  (160 cm)  Ht Readings from Last 3 Encounters:  03/17/22 '5\' 3"'$  (1.6 m)  11/19/21 '5\' 3"'$  (1.6 m)  04/22/21 '5\' 3"'$  (1.6 m)    General appearance: alert, cooperative and appears stated age Head: Normocephalic, without obvious abnormality, atraumatic Neck: no adenopathy, supple, symmetrical, trachea midline and thyroid normal to inspection and palpation Lungs: clear to auscultation bilaterally Cardiovascular: regular rate and rhythm Breasts:  evidence of left mastectomy and left breast implant. No masses.  Abdomen: soft, non-tender; non distended,  no masses,  no organomegaly Extremities: extremities normal, atraumatic, no cyanosis or edema Skin: Skin color, texture, turgor normal. No rashes or lesions Lymph nodes: Cervical, supraclavicular, and axillary nodes normal. No abnormal inguinal nodes palpated Neurologic: Grossly normal   Pelvic: External genitalia:  no lesions              Urethra:  normal appearing urethra with no masses, tenderness or lesions              Bartholins and Skenes: normal                 Vagina: normal appearing vagina with normal color and discharge, no lesions              Cervix: no lesions               Bimanual Exam:  Uterus:   no masses or tenderness              Adnexa: no mass, fullness, tenderness                Rectovaginal: Confirms               Anus:  normal sphincter tone, no lesions  Gae Dry chaperoned for the exam.  1. Well woman exam Discussed breast self exam Discussed calcium and vit D intake Mammogram UTD CBC and CMP are UTD  2. History of left breast cancer  Mammogram UTD Followed by Oncology  3. Colon cancer screening - Ambulatory referral to Gastroenterology  4. Prediabetes - Hemoglobin A1c  5. Lipids abnormal - Lipid panel

## 2022-03-13 ENCOUNTER — Ambulatory Visit: Payer: No Typology Code available for payment source | Admitting: Obstetrics and Gynecology

## 2022-03-14 ENCOUNTER — Ambulatory Visit: Payer: No Typology Code available for payment source | Admitting: Obstetrics and Gynecology

## 2022-03-17 ENCOUNTER — Encounter: Payer: Self-pay | Admitting: Obstetrics and Gynecology

## 2022-03-17 ENCOUNTER — Ambulatory Visit (INDEPENDENT_AMBULATORY_CARE_PROVIDER_SITE_OTHER): Payer: No Typology Code available for payment source | Admitting: Obstetrics and Gynecology

## 2022-03-17 VITALS — BP 138/80 | HR 79 | Ht 63.0 in | Wt 236.0 lb

## 2022-03-17 DIAGNOSIS — Z01419 Encounter for gynecological examination (general) (routine) without abnormal findings: Secondary | ICD-10-CM | POA: Diagnosis not present

## 2022-03-17 DIAGNOSIS — Z853 Personal history of malignant neoplasm of breast: Secondary | ICD-10-CM | POA: Diagnosis not present

## 2022-03-17 DIAGNOSIS — M940 Chondrocostal junction syndrome [Tietze]: Secondary | ICD-10-CM | POA: Insufficient documentation

## 2022-03-17 DIAGNOSIS — E7889 Other lipoprotein metabolism disorders: Secondary | ICD-10-CM

## 2022-03-17 DIAGNOSIS — R7303 Prediabetes: Secondary | ICD-10-CM | POA: Diagnosis not present

## 2022-03-17 DIAGNOSIS — Z1211 Encounter for screening for malignant neoplasm of colon: Secondary | ICD-10-CM | POA: Insufficient documentation

## 2022-03-17 NOTE — Patient Instructions (Signed)

## 2022-03-18 LAB — HEMOGLOBIN A1C
Hgb A1c MFr Bld: 5.6 % of total Hgb (ref <5.7)
Mean Plasma Glucose: 114 mg/dL
eAG (mmol/L): 6.3 mmol/L

## 2022-03-18 LAB — LIPID PANEL
Cholesterol: 206 mg/dL — ABNORMAL HIGH (ref <200)
HDL: 44 mg/dL — ABNORMAL LOW (ref >=50)
LDL Cholesterol (Calc): 134 mg/dL (calc) — ABNORMAL HIGH
Non-HDL Cholesterol (Calc): 162 mg/dL (calc) — ABNORMAL HIGH (ref <130)
Total CHOL/HDL Ratio: 4.7 (calc) (ref <5.0)
Triglycerides: 149 mg/dL (ref <150)

## 2022-04-02 ENCOUNTER — Ambulatory Visit: Payer: No Typology Code available for payment source | Admitting: Dermatology

## 2022-04-21 ENCOUNTER — Other Ambulatory Visit: Payer: Self-pay | Admitting: Hematology and Oncology

## 2022-05-22 ENCOUNTER — Ambulatory Visit
Admission: RE | Admit: 2022-05-22 | Discharge: 2022-05-22 | Disposition: A | Discharge status: Home / Self Care | Payer: No Typology Code available for payment source | Source: Ambulatory Visit | Attending: Hematology and Oncology | Admitting: Hematology and Oncology

## 2022-05-22 DIAGNOSIS — Z13820 Encounter for screening for osteoporosis: Secondary | ICD-10-CM

## 2022-05-23 ENCOUNTER — Other Ambulatory Visit: Payer: No Typology Code available for payment source

## 2022-05-23 ENCOUNTER — Ambulatory Visit: Payer: No Typology Code available for payment source | Admitting: Hematology and Oncology

## 2022-05-26 ENCOUNTER — Other Ambulatory Visit: Payer: Self-pay | Admitting: *Deleted

## 2022-05-26 DIAGNOSIS — C50412 Malignant neoplasm of upper-outer quadrant of left female breast: Secondary | ICD-10-CM

## 2022-05-27 ENCOUNTER — Other Ambulatory Visit: Payer: No Typology Code available for payment source

## 2022-05-27 ENCOUNTER — Inpatient Hospital Stay: Payer: No Typology Code available for payment source | Attending: Hematology and Oncology

## 2022-05-27 ENCOUNTER — Other Ambulatory Visit: Payer: Self-pay

## 2022-05-27 ENCOUNTER — Encounter: Payer: Self-pay | Admitting: Hematology and Oncology

## 2022-05-27 ENCOUNTER — Inpatient Hospital Stay (HOSPITAL_BASED_OUTPATIENT_CLINIC_OR_DEPARTMENT_OTHER): Payer: No Typology Code available for payment source | Admitting: Hematology and Oncology

## 2022-05-27 ENCOUNTER — Ambulatory Visit: Payer: No Typology Code available for payment source | Admitting: Hematology and Oncology

## 2022-05-27 VITALS — BP 150/85 | HR 85 | Temp 98.1°F | Resp 16 | Ht 63.0 in | Wt 234.8 lb

## 2022-05-27 DIAGNOSIS — Z86711 Personal history of pulmonary embolism: Secondary | ICD-10-CM | POA: Insufficient documentation

## 2022-05-27 DIAGNOSIS — Z170 Estrogen receptor positive status [ER+]: Secondary | ICD-10-CM

## 2022-05-27 DIAGNOSIS — Z9012 Acquired absence of left breast and nipple: Secondary | ICD-10-CM | POA: Diagnosis not present

## 2022-05-27 DIAGNOSIS — C50412 Malignant neoplasm of upper-outer quadrant of left female breast: Secondary | ICD-10-CM | POA: Insufficient documentation

## 2022-05-27 DIAGNOSIS — Z87891 Personal history of nicotine dependence: Secondary | ICD-10-CM | POA: Diagnosis not present

## 2022-05-27 DIAGNOSIS — Z79811 Long term (current) use of aromatase inhibitors: Secondary | ICD-10-CM | POA: Insufficient documentation

## 2022-05-27 DIAGNOSIS — M8580 Other specified disorders of bone density and structure, unspecified site: Secondary | ICD-10-CM | POA: Insufficient documentation

## 2022-05-27 DIAGNOSIS — Z86718 Personal history of other venous thrombosis and embolism: Secondary | ICD-10-CM | POA: Diagnosis not present

## 2022-05-27 LAB — CMP (CANCER CENTER ONLY)
ALT: 30 U/L (ref 0–44)
AST: 23 U/L (ref 15–41)
Albumin: 4.8 g/dL (ref 3.5–5.0)
Alkaline Phosphatase: 62 U/L (ref 38–126)
Anion gap: 5 (ref 5–15)
BUN: 13 mg/dL (ref 6–20)
CO2: 31 mmol/L (ref 22–32)
Calcium: 9.5 mg/dL (ref 8.9–10.3)
Chloride: 104 mmol/L (ref 98–111)
Creatinine: 0.66 mg/dL (ref 0.44–1.00)
GFR, Estimated: >60 mL/min (ref >60)
Glucose, Bld: 89 mg/dL (ref 70–99)
Potassium: 4.2 mmol/L (ref 3.5–5.1)
Sodium: 140 mmol/L (ref 135–145)
Total Bilirubin: 0.3 mg/dL (ref 0.3–1.2)
Total Protein: 7.3 g/dL (ref 6.5–8.1)

## 2022-05-27 LAB — CBC WITH DIFFERENTIAL (CANCER CENTER ONLY)
Abs Immature Granulocytes: 0.02 10*3/uL (ref 0.00–0.07)
Basophils Absolute: 0 10*3/uL (ref 0.0–0.1)
Basophils Relative: 0 %
Eosinophils Absolute: 0.2 10*3/uL (ref 0.0–0.5)
Eosinophils Relative: 2 %
HCT: 35.4 % — ABNORMAL LOW (ref 36.0–46.0)
Hemoglobin: 12.3 g/dL (ref 12.0–15.0)
Immature Granulocytes: 0 %
Lymphocytes Relative: 19 %
Lymphs Abs: 1.7 10*3/uL (ref 0.7–4.0)
MCH: 30.1 pg (ref 26.0–34.0)
MCHC: 34.7 g/dL (ref 30.0–36.0)
MCV: 86.6 fL (ref 80.0–100.0)
Monocytes Absolute: 0.7 10*3/uL (ref 0.1–1.0)
Monocytes Relative: 8 %
Neutro Abs: 6.3 10*3/uL (ref 1.7–7.7)
Neutrophils Relative %: 71 %
Platelet Count: 302 10*3/uL (ref 150–400)
RBC: 4.09 MIL/uL (ref 3.87–5.11)
RDW: 12 % (ref 11.5–15.5)
WBC Count: 9 10*3/uL (ref 4.0–10.5)
nRBC: 0 % (ref 0.0–0.2)

## 2022-05-27 NOTE — Progress Notes (Signed)
[facility]  [phone] [phone]    ID: Keli Buehner DOB: 1965/10/31  MR#: [id number]  ERX#:[id number]  Patient Care Team: Patient, [name] as PCP - General (General Practice) Rolm Bookbinder, MD as Consulting Physician (General Surgery) Magrinat, Virgie Dad, MD (Inactive) as Consulting Physician (Oncology) Eppie Gibson, MD as Attending Physician (Radiation Oncology) Rockwell Germany, RN as Oncology Nurse Navigator Mauro Kaufmann, RN as Oncology Nurse Navigator [name], MD as Consulting Physician (Plastic Surgery) OTHER MD:   CHIEF COMPLAINT: Estrogen receptor positive breast cancer (s/p left mastectomy)  CURRENT TREATMENT: Anastrozole  INTERVAL HISTORY:  Alajia returns today for follow-up of her estrogen receptor positive breast cancer. She was started on anastrozole on 04/09/2019.  She has been taking this as prescribed.  She has been tolerating it overall really well except for some mild hot flashes especially nocturnal and arthralgias.  She also reports some nocturia which could be related to increased water intake, maximum 3 times at night.  No other complaints today.  She has not noticed any masses in her breast.   She continues to stay active, walks regularly as well as maintains a vegetable garden. Rest of the pertinent 10 point ROS reviewed and negative   COVID 19 VACCINATION STATUS: unvaccinated; had COVID OCT 2020   HISTORY OF CURRENT ILLNESS: From the original intake note:  YARITHZA MINK underwent one-year follow up bilateral diagnostic mammography with tomography for benign right breast calcifications at [facility] on 10/04/2018 showing: Breast Density Category B. There are new calcifications in the upper outer and retroareolar left breast.. Magnification views confirm pleomorphic calcifications spanning approximately 10 cm anterior to posterior diameter, by 8 cm craniocaudal span by approximately  7 cm transverse diameter. While the majority of the calcifications are in the upper-outer quadrant, there are also suspicious calcifications in the central and medial retroareolar left breast, upper inner quadrant. No suspicious mass or distortion is identified in the left breast.   Accordingly on 10/08/2018 she proceeded to biopsies of the left breast areas in question. 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 Prognostic indicators significant for: estrogen receptor, 50% positive with moderate staining intensity and progesterone receptor, 50% positive with moderate-weak staining intensity. Proliferation marker Ki67 at <1%. HER2 negative (1+) by immunohistochemistry.   She underwent an additional left breast biopsy on the same day. The pathology from this retroareolar upper outer quadrant procedure showed ([id number]): ductal carcinoma in situ with calcifications, intermediate grade.  Prognostic panel was not obtained on this tissue.  Then, she underwent an ultrasound of bilateral axillae on 10/14/2018 showing no abnormalities within either axilla. No abnormal lymph nodes are identified.   Magnification views of the right breast show mammographically stable predominately punctate calcifications in the upper inner quadrant, a few of which demonstrate layering. There are additional scattered calcifications in the inner inner right breast. No suspicious mass or architectural distortion on the right.    Finally, she underwent a biopsy of the right breast area in question on 10/14/2018. The pathology from this procedure showed ([id number]): atypical ductal hyperplasia with calcifications, lobular neoplasia (atypical lobular hyperplasia), and fibrocystic changes with calcifications.   The patient's subsequent history is as detailed below.   PAST MEDICAL HISTORY: Past Medical History:   Diagnosis Date   Basal cell carcinoma 12/22/2019   left ant nasal ala crease   Breast cancer ([place])  Family history of breast cancer    Pulmonary embolus ([place]) 2020    PAST SURGICAL HISTORY: Past Surgical History:  Procedure Laterality Date   AUGMENTATION MAMMAPLASTY Left 04/2019   BREAST BIOPSY Right 11/22/2018   atypical , negative breast cancer   BREAST BIOPSY Right 10/12/2020   BREAST RECONSTRUCTION WITH PLACEMENT OF TISSUE EXPANDER AND ALLODERM Left 11/22/2018   Procedure: LEFT BREAST RECONSTRUCTION WITH TISSUE EXPANDER AND ACELLULAR DERMIS;  Surgeon: [name], MD;  Location: [facility];  Service: Plastics;  Laterality: Left;   BREAST REDUCTION SURGERY Right 04/26/2019   Procedure: RIGHT BREAST REDUCTION;  Surgeon: [name], MD;  Location: [facility];  Service: Plastics;  Laterality: Right;   Broken Wrist  1999   CESAREAN SECTION     endometrial biopsy  06/2014   LIPOSUCTION WITH LIPOFILLING Left 04/26/2019   Procedure: LIPOFILLING FROM ABDOMEN TO LEFT CHEST;  Surgeon: [name], MD;  Location: [facility];  Service: Plastics;  Laterality: Left;   MASTECTOMY Left 11/22/2018   total/complete with tissue expander and implant    MASTECTOMY W/ SENTINEL NODE BIOPSY Left 11/22/2018   Procedure: LEFT TOTAL MASTECTOMY WITH LEFT AXILLARY SENTINEL LYMPH NODE BIOPSY;  Surgeon: Rolm Bookbinder, MD;  Location: [facility];  Service: General;  Laterality: Left;   RADIOACTIVE SEED GUIDED EXCISIONAL BREAST BIOPSY Right 11/22/2018   Procedure: RIGHT BREAST RADIOACTIVE SEED GUIDED EXCISIONAL BIOPSY;  Surgeon: Rolm Bookbinder, MD;  Location: [facility];  Service: General;  Laterality: Right;   REDUCTION MAMMAPLASTY Right 04/26/2019   REMOVAL OF TISSUE EXPANDER AND PLACEMENT OF IMPLANT Left 04/26/2019   Procedure: REMOVAL OF LEFT TISSUE EXPANDER AND PLACEMENT OF IMPLANT;  Surgeon: [name], MD;  Location: [facility];  Service: Plastics;  Laterality: Left;    FAMILY HISTORY: Family History  Problem Relation Age of Onset   Breast cancer Mother 10       died 75; "negative genetic testing"   Diabetes Father    Hypertension Father    Hydrocephalus Brother    Breast cancer Maternal Grandmother 39       d. 66   Breast cancer Paternal Grandmother 66       d. 80   Breast cancer Paternal Aunt 69   Breast cancer Paternal Aunt 59       "genetic testing negative"   Other Maternal Grandfather        WWII   Lymphoma Paternal Grandfather   Coreena's father died from heart complications following a myocardial infarction at age 31. Patients' mother died from metastatic breast cancer at age 79. The patient has 1 brother and 1 sister. Delicia's mother was diagnosed with breast cancer at age 82. Aspasia's paternal grandmother, maternal grandmother, and two paternal aunts were diagnosed with breast cancer. Patient denies anyone in her family having ovarian, prostate, or pancreatic cancer. Gavrielle's paternal grandfather was diagnosed with lymphoma.    GYNECOLOGIC HISTORY:  Patient's last menstrual period was 07/14/2017. Menarche: 56 years old Age at first live birth: 56 years old [place] P: 1 LMP: 09/17/2018 Contraceptive: yes, about 5 years HRT:   Hysterectomy?: no BSO?: no   SOCIAL HISTORY: (Current as of 03/23/2019) Thayer Headings works as a Chiropractor at an Equities trader. She is divorced. She lives alone with her dog, a Engineer, petroleum mix (150 lbs). Her significant other, [name], works for the [employer] at a water treatment facility. Katiya has one child, [name], who is  47, lives in [place], and graduated from [employer] with a degree in [place]. [name] is currently working a Insurance claims handler.  ADVANCED DIRECTIVES: Not in place. Amylee was given the appropriate healthcare power of attorney form at the 10/20/2018 visit to fill  out and return at her discretion   HEALTH MAINTENANCE: Social History   Tobacco Use   Smoking status: Former    Types: Cigarettes    Quit date: 09/08/1993    Years since quitting: 28.7   Smokeless tobacco: Never  Substance Use Topics   Alcohol use: Not Currently   Drug use: No    Colonoscopy: no  PAP: yes, 09/10/2018  Bone density: no   Allergies  Allergen Reactions   Sulfa Antibiotics Other (See Comments)    Pt unsure   Penicillins Rash    Did it involve swelling of the face/tongue/throat, SOB, or low BP? No Did it involve sudden or severe rash/hives, skin peeling, or any reaction on the inside of your mouth or nose? Yes Did you need to seek medical attention at a hospital or doctor's office? No When did it last happen?      unknown If all above answers are "NO", may proceed with cephalosporin use.     Current Outpatient Medications  Medication Sig Dispense Refill   anastrozole (ARIMIDEX) 1 MG tablet TAKE 1 TABLET BY MOUTH EVERY DAY 30 tablet 2   calcium carbonate (OSCAL) 1500 (600 Ca) MG TABS tablet Take 1 tablet (1,500 mg total) by mouth 2 (two) times daily with a meal.  0   Multiple Vitamin (MULTIVITAMIN) tablet Take 1 tablet by mouth daily.       VITAMIN D PO Take 1 capsule by mouth daily.      No current facility-administered medications for this visit.    OBJECTIVE: white woman in no acute distress  Vitals:   05/27/22 1509  BP: (!) 150/85  Pulse: 85  Resp: 16  Temp: 98.1 F (36.7 C)  SpO2: 98%      Body mass index is 41.59 kg/m.   Wt Readings from Last 3 Encounters:  05/27/22 234 lb 12.8 oz (106.5 kg)  03/17/22 236 lb (107 kg)  11/19/21 248 lb 11.2 oz (112.8 kg)     ECOG FS:1 - Symptomatic but completely ambulatory  Physical Exam Constitutional:      Appearance: Normal appearance.  Chest:     Comments: Right breast status postreduction mammoplasty.  No concern for recurrence.  Left breast status post mastectomy and implant.  No concern for  recurrence or regional adenopathy. Musculoskeletal:     Cervical back: Normal range of motion and neck supple. No rigidity.  Lymphadenopathy:     Cervical: No cervical adenopathy.  Neurological:     Mental Status: She is alert.       LAB RESULTS:  CMP     Component Value Date/Time   NA 140 05/27/2022 1436   NA 141 08/06/2017 0927   K 4.2 05/27/2022 1436   CL 104 05/27/2022 1436   CO2 31 05/27/2022 1436   GLUCOSE 89 05/27/2022 1436   BUN 13 05/27/2022 1436   BUN 9 08/06/2017 0927   CREATININE 0.66 05/27/2022 1436   CREATININE 0.74 07/30/2016 1049   CALCIUM 9.5 05/27/2022 1436   PROT 7.3 05/27/2022 1436   PROT 7.1 08/06/2017 0927   ALBUMIN 4.8 05/27/2022 1436   ALBUMIN 4.3 08/06/2017 0927   AST 23 05/27/2022 1436   ALT 30 05/27/2022 1436   ALKPHOS 62  05/27/2022 1436   BILITOT 0.3 05/27/2022 1436   GFRNONAA >60 05/27/2022 1436   GFRAA >60 02/20/2020 1431   GFRAA >60 10/20/2018 0831    No results found for: "TOTALPROTELP", "ALBUMINELP", "A1GS", "A2GS", "BETS", "BETA2SER", "GAMS", "MSPIKE", "SPEI"  No results found for: "KPAFRELGTCHN", "LAMBDASER", "KAPLAMBRATIO"  Lab Results  Component Value Date   WBC 9.0 05/27/2022   NEUTROABS 6.3 05/27/2022   HGB 12.3 05/27/2022   HCT 35.4 (L) 05/27/2022   MCV 86.6 05/27/2022   PLT 302 05/27/2022   No results found for: "LABCA2"  No components found for: "VWUJWJ191"  No results for input(s): "INR" in the last 168 hours.  No results found for: "LABCA2"  No results found for: "CAN199"  No results found for: "CAN125"  No results found for: "CAN153"  No results found for: "CA2729"  No components found for: "HGQUANT"  No results found for: "CEA1", "CEA" / No results found for: "CEA1", "CEA"   No results found for: "AFPTUMOR"  No results found for: "CHROMOGRNA"  No results found for: "HGBA", "HGBA2QUANT", "HGBFQUANT", "HGBSQUAN" (Hemoglobinopathy evaluation)   No results found for: "LDH"  Lab Results   Component Value Date   IRON 65 07/30/2016   TIBC 439 07/30/2016   IRONPCTSAT 15 07/30/2016   (Iron and TIBC)  Lab Results  Component Value Date   FERRITIN 33 07/30/2016    Urinalysis    Component Value Date/Time   COLORURINE YELLOW 07/29/2013 1501   APPEARANCEUR CLEAR 07/29/2013 1501   LABSPEC 1.005 07/29/2013 1501   PHURINE 6.5 07/29/2013 1501   GLUCOSEU NEG 07/29/2013 1501   HGBUR NEG 07/29/2013 1501   BILIRUBINUR n 07/30/2016 0840   KETONESUR NEG 07/29/2013 1501   PROTEINUR n 07/30/2016 0840   PROTEINUR NEG 07/29/2013 1501   UROBILINOGEN negative 07/30/2016 0840   UROBILINOGEN 0.2 07/29/2013 1501   NITRITE n 07/30/2016 0840   NITRITE NEG 07/29/2013 1501   LEUKOCYTESUR Negative 07/30/2016 0840    STUDIES:  DG Bone Density  Result Date: 05/22/2022 EXAM: DUAL X-RAY ABSORPTIOMETRY (DXA) FOR BONE MINERAL DENSITY IMPRESSION: Referring Physician:  Benay Pike Your patient completed a bone mineral density test using GE Lunar iDXA system (analysis version: 16). Technologist: EDH PATIENT: Name: Penelopi, Mikrut Patient ID:  [id number] Birth Date: August 10, 1966 Height:     63.5 in. Sex:         Female Measured:   05/22/2022 Weight:     236.8 lbs. Indications: Anastrazole, Breast Cancer History, Caucasian, Estrogen Deficient, Postmenopausal, Secondary Osteoporosis, History of Fracture (Adult) (V15.51) Fractures: Left wrist Treatments: Hormone Therapy For Cancer, Multivitamin, Vitamin D (E933.5) ASSESSMENT: The BMD measured at AP Spine L2-L3 is 1.065 g/cm2 with a T-score of -1.1. This patient is considered osteopenic/low bone mass according to [facility]) criteria. The quality of the exam is good. L1, L4 was excluded due to degenerative changes. Site Region Measured Date Measured Age YA BMD Significant CHANGE T-score AP Spine L2-L3 05/22/2024 54.4 -1.1 1.065 g/cm2 * AP Spine L2-L3 11/23/2019 51.9 [age] 1.231 g/cm2 DualFemur Neck Left 05/22/2022 54.4 0.4 1.090 g/cm2 *  DualFemur Neck Left 11/23/2019 51.9 0.11 1.168 g/cm2 DualFemur Total Mean 05/22/2022 54.4 0.11 1.122 g/cm2 * DualFemur Total Mean 11/23/2019 51.9 1.8 1.205 g/cm2 Left Forearm Radius 33% 05/22/2022 54.4 1.7 1.008 g/cm2 World Health Organization [facility]) criteria for post-menopausal, Caucasian Women: Normal       T-score at or above -1 SD Osteopenia   T-score between -1 and -2.5 SD Osteoporosis T-score at or below -2.5  SD RECOMMENDATION: 1. All patients should optimize calcium and vitamin D intake. 2. Consider FDA-approved medical therapies in postmenopausal women and men aged 3 years and older, based on the following: a. A hip or vertebral (clinical or morphometric) fracture. b. T-score = -2.5 at the femoral neck or spine after appropriate evaluation to exclude secondary causes. c. Low bone mass (T-score between -1.0 and -2.5 at the femoral neck or spine) and a 10-year probability of a hip fracture = 3% or a 10-year probability of a major osteoporosis-related fracture = 20% based on the [place]-adapted WHO algorithm. d. Clinician judgment and/or patient preferences may indicate treatment for people with 10-year fracture probabilities above or below these levels. FOLLOW-UP: Patients with diagnosis of osteoporosis or at high risk for fracture should have regular bone mineral density tests. Patients eligible for Medicare are allowed routine testing every 2 years. The testing frequency can be increased to one year for patients who have rapidly progressing disease, are receiving or discontinuing medical therapy to restore bone mass, or have additional risk factors. I have reviewed this study and agree with the findings. [facility] FRAX* 10-year Probability of Fracture Based on femoral neck BMD: DualFemur (Left) Major Osteoporotic Fracture: 7.4% Hip Fracture:                0.1% Population:                  [place] (Caucasian) Risk Factors: Secondary Osteoporosis, History of Fracture (Adult) (V15.51) *FRAX is a  Materials engineer of the [employer] for Metabolic Bone Disease, a [place] (WHO) [facility]. ASSESSMENT: The probability of a major osteoporotic fracture is 7.4% within the next ten years. The probability of a hip fracture is 0.1% within the next ten years. Electronically Signed   By: Ammie Ferrier M.D.   On: 05/22/2022 16:05     ELIGIBLE FOR AVAILABLE RESEARCH PROTOCOL: no   ASSESSMENT: 56 y.o. 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  (2) tamoxifen started neoadjuvantly 10/20/2018, stopped 11/26/2018 (a week prior to surgery) and not resumed postop due to development of DVT/PE  (3) status post left total mastectomy with sentinel lymph node sampling 11/22/2018 for a pT1a pN0, stage IA invasive ductal carcinoma, grade 1, with negative margins  (a) a total of 3 sentinel and 2 intramammary lymph nodes removed  (b) immediate left expander placement followed by saline implant placement 04/26/2019  (4) status post right lumpectomy 11/22/2018 showing atypical ductal hyperplasia  (5) multifocal acute segmental and subsegmental pulmonary embolism documented by CT angiogram 12/03/2018; no hypotension, negative for right heart strain  (a) IV heparin 12/03/2018 through 12/06/2018  (b) started apixaban 12/06/2018, 10 mg BID x 7 d, then 5 mg BID  (c) D-dimer 03/15/2019 = less than 0.27  (d) stopped apixaban 04/08/2019  (e) repeat d-dimer October 2020 WNL  (6) Genetic testing through [facility] Common Hereditary Cancers Panel completed on 12/03/2017 showed no deleterious mutations in: APC, ATM, AXIN2, BARD1, BMPR1A, BRCA1, BRCA2,  BRIP1, CDH1, CDK4, CDKN2A (p14ARF), CDKN2A (p16INK4a), CHEK2, CTNNA1, DICER1, EPCAM*, GREM1*, KIT, MEN1, MLH1, MSH2, MSH3, MSH6, MUTYH, NBN, NF1, PALB2, PDGFRA, PMS2, POLD1, POLE, PTEN, RAD50, RAD51C, RAD51D, SDHB, SDHC, SDHD, SMAD4, SMARCA4,  STK11, TP53, TSC1, TSC2, VHL The following genes were evaluated for sequence changes only: HOXB13*, NTHL1*, SDHA.  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  (7) started anastrozole 04/09/2019  (a) repeated [place] measurements confirm postmenopausal status  (b) bone density 11/23/2019 shows a T score of 0.6   PLAN:  Ms. Gustavia is here for follow-up on anastrozole.  She has been taking this since August 2020 and has been tolerating it well.  She does have some mild hot flashes and vaginal dryness which are tolerable. No evidence of recurrence on physical examination today she declines any additional medication for her side effects. Last mammogram of the right breast in November 01, 2021, stable appearance of the biopsy site with previously demonstrated [place].  No evidence of malignancy. Repeat bone density ordered, last bone density in March 2021 and was normal.  Recommended weightbearing exercises. Recent bone density shows mild worsening of bone density with a new onset osteopenia.  We once again discussed about calcium/vitamin D supplementation and weightbearing exercises, she understands that this can get worse with anastrozole Total encounter time 30 minutes.*  *Total Encounter Time as defined by the Centers for Medicare and Medicaid Services includes, in addition to the face-to-face time of a patient visit (documented in the note above) non-face-to-face time: obtaining and reviewing outside history, ordering and reviewing medications, tests or procedures, care coordination (communications with other health care professionals or caregivers) and documentation in the medical record.

## 2022-07-07 ENCOUNTER — Encounter: Payer: Self-pay | Admitting: Dermatology

## 2022-07-07 ENCOUNTER — Ambulatory Visit (INDEPENDENT_AMBULATORY_CARE_PROVIDER_SITE_OTHER): Payer: No Typology Code available for payment source | Admitting: Dermatology

## 2022-07-07 DIAGNOSIS — L821 Other seborrheic keratosis: Secondary | ICD-10-CM

## 2022-07-07 DIAGNOSIS — D229 Melanocytic nevi, unspecified: Secondary | ICD-10-CM

## 2022-07-07 DIAGNOSIS — L738 Other specified follicular disorders: Secondary | ICD-10-CM

## 2022-07-07 DIAGNOSIS — L814 Other melanin hyperpigmentation: Secondary | ICD-10-CM | POA: Diagnosis not present

## 2022-07-07 DIAGNOSIS — Z1283 Encounter for screening for malignant neoplasm of skin: Secondary | ICD-10-CM | POA: Diagnosis not present

## 2022-07-07 DIAGNOSIS — Z85828 Personal history of other malignant neoplasm of skin: Secondary | ICD-10-CM

## 2022-07-07 DIAGNOSIS — L578 Other skin changes due to chronic exposure to nonionizing radiation: Secondary | ICD-10-CM | POA: Diagnosis not present

## 2022-07-07 NOTE — Progress Notes (Unsigned)
   Follow-Up Visit   Subjective  Jennifer Harrison is a 56 y.o. female who presents for the following: Annual Exam (Hx BCC). The patient presents for Total-Body Skin Exam (TBSE) for skin cancer screening and mole check.  The patient has spots, moles and lesions to be evaluated, some may be new or changing and the patient has concerns that these could be cancer.  The following portions of the chart were reviewed this encounter and updated as appropriate:   Tobacco  Allergies  Meds  Problems  Med Hx  Surg Hx  Fam Hx     Review of Systems:  No other skin or systemic complaints except as noted in HPI or Assessment and Plan.  Objective  Well appearing patient in no apparent distress; mood and affect are within normal limits.  A full examination was performed including scalp, head, eyes, ears, nose, lips, neck, chest, axillae, abdomen, back, buttocks, bilateral upper extremities, bilateral lower extremities, hands, feet, fingers, toes, fingernails, and toenails. All findings within normal limits unless otherwise noted below.   Assessment & Plan   Lentigines - Scattered tan macules - Due to sun exposure - Benign-appearing, observe - Recommend daily broad spectrum sunscreen SPF 30+ to sun-exposed areas, reapply every 2 hours as needed. - Call for any changes  Seborrheic Keratoses - Stuck-on, waxy, tan-brown papules and/or plaques  - Benign-appearing - Discussed benign etiology and prognosis. - Observe - Call for any changes  Melanocytic Nevi - Tan-brown and/or pink-flesh-colored symmetric macules and papules - Benign appearing on exam today - Observation - Call clinic for new or changing moles - Recommend daily use of broad spectrum spf 30+ sunscreen to sun-exposed areas.   Hemangiomas - Red papules - Discussed benign nature - Observe - Call for any changes  Actinic Damage - Chronic condition, secondary to cumulative UV/sun exposure - diffuse scaly erythematous  macules with underlying dyspigmentation - Recommend daily broad spectrum sunscreen SPF 30+ to sun-exposed areas, reapply every 2 hours as needed.  - Staying in the shade or wearing long sleeves, sun glasses (UVA+UVB protection) and wide brim hats (4-inch brim around the entire circumference of the hat) are also recommended for sun protection.  - Call for new or changing lesions.  History of Basal Cell Carcinoma of the Skin - No evidence of recurrence today - Recommend regular full body skin exams - Recommend daily broad spectrum sunscreen SPF 30+ to sun-exposed areas, reapply every 2 hours as needed.  - Call if any new or changing lesions are noted between office visits  Sebaceous Hyperplasia - Small yellow papules with a central dell - Benign - Observe  Skin cancer screening performed today.  Return in about 1 year (around 07/08/2023) for TBSE.  Luther Redo, CMA, am acting as scribe for Sarina Ser, MD . Documentation: I have reviewed the above documentation for accuracy and completeness, and I agree with the above.  Sarina Ser, MD

## 2022-07-07 NOTE — Patient Instructions (Signed)
Due to recent changes in healthcare laws, you may see results of your pathology and/or laboratory studies on MyChart before the doctors have had a chance to review them. We understand that in some cases there may be results that are confusing or concerning to you. Please understand that not all results are received at the same time and often the doctors may need to interpret multiple results in order to provide you with the best plan of care or course of treatment. Therefore, we ask that you please give us 2 business days to thoroughly review all your results before contacting the office for clarification. Should we see a critical lab result, you will be contacted sooner.   If You Need Anything After Your Visit  If you have any questions or concerns for your doctor, please call our main line at [phone] and press option 4 to reach your doctor's medical assistant. If no one answers, please leave a voicemail as directed and we will return your call as soon as possible. Messages left after 4 pm will be answered the following business day.   You may also send us a message via MyChart. We typically respond to MyChart messages within 1-2 business days.  For prescription refills, please ask your pharmacy to contact our office. Our fax number is [phone].  If you have an urgent issue when the clinic is closed that cannot wait until the next business day, you can page your doctor at the number below.    Please note that while we do our best to be available for urgent issues outside of office hours, we are not available 24/7.   If you have an urgent issue and are unable to reach us, you may choose to seek medical care at your doctor's office, retail clinic, urgent care center, or emergency room.  If you have a medical emergency, please immediately call 911 or go to the emergency department.  Pager Numbers  - Dr. Kowalski: [phone]  - Dr. Moye: [phone]  - Dr. Stewart:  [phone]  In the event of inclement weather, please call our main line at [phone] for an update on the status of any delays or closures.  Dermatology Medication Tips: Please keep the boxes that topical medications come in in order to help keep track of the instructions about where and how to use these. Pharmacies typically print the medication instructions only on the boxes and not directly on the medication tubes.   If your medication is too expensive, please contact our office at [phone] option 4 or send us a message through MyChart.   We are unable to tell what your co-pay for medications will be in advance as this is different depending on your insurance coverage. However, we may be able to find a substitute medication at lower cost or fill out paperwork to get insurance to cover a needed medication.   If a prior authorization is required to get your medication covered by your insurance company, please allow us 1-2 business days to complete this process.  Drug prices often vary depending on where the prescription is filled and some pharmacies may offer cheaper prices.  The website www.goodrx.com contains coupons for medications through different pharmacies. The prices here do not account for what the cost may be with help from insurance (it may be cheaper with your insurance), but the website can give you the price if you did not use any insurance.  - You can print the associated coupon and take it with   your prescription to the pharmacy.  - You may also stop by our office during regular business hours and pick up a GoodRx coupon card.  - If you need your prescription sent electronically to a different pharmacy, notify our office through [facility] MyChart or by phone at [phone] option 4.     Si Usted Necesita Algo Despus de Su Visita  Tambin puede enviarnos un mensaje a travs de MyChart. Por lo general respondemos a los mensajes de MyChart en el transcurso de 1 a 2  das hbiles.  Para renovar recetas, por favor pida a su farmacia que se ponga en contacto con nuestra oficina. Nuestro nmero de fax es el [phone].  Si tiene un asunto urgente cuando la clnica est cerrada y que no puede esperar hasta el siguiente da hbil, puede llamar/localizar a su doctor(a) al nmero que aparece a continuacin.   Por favor, tenga en cuenta que aunque hacemos todo lo posible para estar disponibles para asuntos urgentes fuera del horario de oficina, no estamos disponibles las 24 horas del da, los 7 das de la semana.   Si tiene un problema urgente y no puede comunicarse con nosotros, puede optar por buscar atencin mdica  en el consultorio de su doctor(a), en una clnica privada, en un centro de atencin urgente o en una sala de emergencias.  Si tiene una emergencia mdica, por favor llame inmediatamente al 911 o vaya a la sala de emergencias.  Nmeros de bper  - Dr. Kowalski: [phone]  - Dra. Moye: [phone]  - Dra. Stewart: [phone]  En caso de inclemencias del tiempo, por favor llame a nuestra lnea principal al [phone] para una actualizacin sobre el estado de cualquier retraso o cierre.  Consejos para la medicacin en dermatologa: Por favor, guarde las cajas en las que vienen los medicamentos de uso tpico para ayudarle a seguir las instrucciones sobre dnde y cmo usarlos. Las farmacias generalmente imprimen las instrucciones del medicamento slo en las cajas y no directamente en los tubos del medicamento.   Si su medicamento es muy caro, por favor, pngase en contacto con nuestra oficina llamando al [phone] y presione la opcin 4 o envenos un mensaje a travs de MyChart.   No podemos decirle cul ser su copago por los medicamentos por adelantado ya que esto es diferente dependiendo de la cobertura de su seguro. Sin embargo, es posible que podamos encontrar un medicamento sustituto a menor costo o llenar un formulario para que el  seguro cubra el medicamento que se considera necesario.   Si se requiere una autorizacin previa para que su compaa de seguros cubra su medicamento, por favor permtanos de 1 a 2 das hbiles para completar este proceso.  Los precios de los medicamentos varan con frecuencia dependiendo del lugar de dnde se surte la receta y alguna farmacias pueden ofrecer precios ms baratos.  El sitio web www.goodrx.com tiene cupones para medicamentos de diferentes farmacias. Los precios aqu no tienen en cuenta lo que podra costar con la ayuda del seguro (puede ser ms barato con su seguro), pero el sitio web puede darle el precio si no utiliz ningn seguro.  - Puede imprimir el cupn correspondiente y llevarlo con su receta a la farmacia.  - Tambin puede pasar por nuestra oficina durante el horario de atencin regular y recoger una tarjeta de cupones de GoodRx.  - Si necesita que su receta se enve electrnicamente a una farmacia diferente, informe a nuestra oficina a travs de MyChart de    o por telfono llamando al [phone] y presione la opcin 4.  

## 2022-07-08 ENCOUNTER — Encounter: Payer: Self-pay | Admitting: Dermatology

## 2022-07-26 ENCOUNTER — Other Ambulatory Visit: Payer: Self-pay | Admitting: Hematology and Oncology

## 2022-10-20 ENCOUNTER — Other Ambulatory Visit: Payer: Self-pay | Admitting: Hematology and Oncology

## 2022-11-04 ENCOUNTER — Ambulatory Visit
Admission: RE | Admit: 2022-11-04 | Discharge: 2022-11-04 | Disposition: A | Discharge status: Home / Self Care | Payer: No Typology Code available for payment source | Source: Ambulatory Visit | Attending: Hematology and Oncology | Admitting: Hematology and Oncology

## 2022-11-04 DIAGNOSIS — Z170 Estrogen receptor positive status [ER+]: Secondary | ICD-10-CM

## 2022-11-24 ENCOUNTER — Other Ambulatory Visit: Payer: Self-pay

## 2022-11-24 DIAGNOSIS — C50412 Malignant neoplasm of upper-outer quadrant of left female breast: Secondary | ICD-10-CM

## 2022-11-25 ENCOUNTER — Inpatient Hospital Stay (HOSPITAL_BASED_OUTPATIENT_CLINIC_OR_DEPARTMENT_OTHER): Payer: No Typology Code available for payment source | Admitting: Hematology and Oncology

## 2022-11-25 ENCOUNTER — Encounter: Payer: Self-pay | Admitting: Hematology and Oncology

## 2022-11-25 ENCOUNTER — Inpatient Hospital Stay: Payer: No Typology Code available for payment source | Attending: Hematology and Oncology

## 2022-11-25 ENCOUNTER — Other Ambulatory Visit: Payer: Self-pay

## 2022-11-25 VITALS — BP 156/89 | HR 78 | Temp 98.1°F | Resp 16 | Ht 63.0 in | Wt 234.2 lb

## 2022-11-25 DIAGNOSIS — Z170 Estrogen receptor positive status [ER+]: Secondary | ICD-10-CM | POA: Insufficient documentation

## 2022-11-25 DIAGNOSIS — Z87891 Personal history of nicotine dependence: Secondary | ICD-10-CM | POA: Diagnosis not present

## 2022-11-25 DIAGNOSIS — Z79899 Other long term (current) drug therapy: Secondary | ICD-10-CM | POA: Insufficient documentation

## 2022-11-25 DIAGNOSIS — Z86718 Personal history of other venous thrombosis and embolism: Secondary | ICD-10-CM | POA: Diagnosis not present

## 2022-11-25 DIAGNOSIS — C50412 Malignant neoplasm of upper-outer quadrant of left female breast: Secondary | ICD-10-CM | POA: Diagnosis not present

## 2022-11-25 DIAGNOSIS — Z86711 Personal history of pulmonary embolism: Secondary | ICD-10-CM | POA: Insufficient documentation

## 2022-11-25 DIAGNOSIS — M8580 Other specified disorders of bone density and structure, unspecified site: Secondary | ICD-10-CM | POA: Diagnosis not present

## 2022-11-25 DIAGNOSIS — Z79811 Long term (current) use of aromatase inhibitors: Secondary | ICD-10-CM | POA: Insufficient documentation

## 2022-11-25 DIAGNOSIS — Z9012 Acquired absence of left breast and nipple: Secondary | ICD-10-CM | POA: Diagnosis not present

## 2022-11-25 LAB — CMP (CANCER CENTER ONLY)
ALT: 30 U/L (ref 0–44)
AST: 20 U/L (ref 15–41)
Albumin: 4.8 g/dL (ref 3.5–5.0)
Alkaline Phosphatase: 55 U/L (ref 38–126)
Anion gap: 5 (ref 5–15)
BUN: 14 mg/dL (ref 6–20)
CO2: 33 mmol/L — ABNORMAL HIGH (ref 22–32)
Calcium: 9.7 mg/dL (ref 8.9–10.3)
Chloride: 104 mmol/L (ref 98–111)
Creatinine: 0.64 mg/dL (ref 0.44–1.00)
GFR, Estimated: >60 mL/min (ref >60)
Glucose, Bld: 98 mg/dL (ref 70–99)
Potassium: 4.5 mmol/L (ref 3.5–5.1)
Sodium: 142 mmol/L (ref 135–145)
Total Bilirubin: 0.3 mg/dL (ref 0.3–1.2)
Total Protein: 7.3 g/dL (ref 6.5–8.1)

## 2022-11-25 LAB — CBC WITH DIFFERENTIAL (CANCER CENTER ONLY)
Abs Immature Granulocytes: 0.03 10*3/uL (ref 0.00–0.07)
Basophils Absolute: 0.1 10*3/uL (ref 0.0–0.1)
Basophils Relative: 1 %
Eosinophils Absolute: 0.1 10*3/uL (ref 0.0–0.5)
Eosinophils Relative: 2 %
HCT: 37 % (ref 36.0–46.0)
Hemoglobin: 12.4 g/dL (ref 12.0–15.0)
Immature Granulocytes: 0 %
Lymphocytes Relative: 20 %
Lymphs Abs: 1.6 10*3/uL (ref 0.7–4.0)
MCH: 29.4 pg (ref 26.0–34.0)
MCHC: 33.5 g/dL (ref 30.0–36.0)
MCV: 87.7 fL (ref 80.0–100.0)
Monocytes Absolute: 0.8 10*3/uL (ref 0.1–1.0)
Monocytes Relative: 10 %
Neutro Abs: 5.5 10*3/uL (ref 1.7–7.7)
Neutrophils Relative %: 67 %
Platelet Count: 300 10*3/uL (ref 150–400)
RBC: 4.22 MIL/uL (ref 3.87–5.11)
RDW: 11.9 % (ref 11.5–15.5)
WBC Count: 8.1 10*3/uL (ref 4.0–10.5)
nRBC: 0 % (ref 0.0–0.2)

## 2022-11-25 NOTE — Progress Notes (Signed)
[place]  [phone] [phone]    ID: Jennifer Harrison DOB:[id number] 1966-05-03  MR#: [id number]  [id number]  Patient Care Team: Patient, [name] as PCP - General (General Practice) Rolm Bookbinder, MD as Consulting Physician (General Surgery) Magrinat, Virgie Dad, MD (Inactive) as Consulting Physician (Oncology) Eppie Gibson, MD as Attending Physician (Radiation Oncology) Rockwell Germany, RN as Oncology Nurse Navigator Mauro Kaufmann, RN as Oncology Nurse Navigator [name], MD as Consulting Physician (Plastic Surgery)  CHIEF COMPLAINT: Estrogen receptor positive breast cancer (s/p left mastectomy)  CURRENT TREATMENT: Anastrozole  INTERVAL HISTORY:  Margaux returns today for follow-up of her estrogen receptor positive breast cancer. She was started on anastrozole on 04/09/2019.  She has been taking this as prescribed.  She has been tolerating it overall really well except for some mild hot flashes especially nocturnal and arthralgias. She continues to stay active, walks regularly as well as maintains a vegetable garden.  She had her mammogram in February and her bone density in September. Rest of the pertinent 10 point ROS reviewed and negative   COVID 19 VACCINATION STATUS: unvaccinated; had COVID OCT 2020   HISTORY OF CURRENT ILLNESS: From the original intake note:  Jennifer Harrison underwent one-year follow up bilateral diagnostic mammography with tomography for benign right breast calcifications at [facility] on 10/04/2018 showing: Breast Density Category B. There are new calcifications in the upper outer and retroareolar left breast.. Magnification views confirm pleomorphic calcifications spanning approximately 10 cm anterior to posterior diameter, by 8 cm craniocaudal span by approximately 7 cm transverse diameter. While the majority of the calcifications are in the upper-outer quadrant, there are also suspicious  calcifications in the central and medial retroareolar left breast, upper inner quadrant. No suspicious mass or distortion is identified in the left breast.   Accordingly on 10/08/2018 she proceeded to biopsies of the left breast areas in question. 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 Prognostic indicators significant for: estrogen receptor, 50% positive with moderate staining intensity and progesterone receptor, 50% positive with moderate-weak staining intensity. Proliferation marker Ki67 at <1%. HER2 negative (1+) by immunohistochemistry.   She underwent an additional left breast biopsy on the same day. The pathology from this retroareolar upper outer quadrant procedure showed ([id number]): ductal carcinoma in situ with calcifications, intermediate grade.  Prognostic panel was not obtained on this tissue.  Then, she underwent an ultrasound of bilateral axillae on 10/14/2018 showing no abnormalities within either axilla. No abnormal lymph nodes are identified.   Magnification views of the right breast show mammographically stable predominately punctate calcifications in the upper inner quadrant, a few of which demonstrate layering. There are additional scattered calcifications in the inner inner right breast. No suspicious mass or architectural distortion on the right.    Finally, she underwent a biopsy of the right breast area in question on 10/14/2018. The pathology from this procedure showed [id number]): atypical ductal hyperplasia with calcifications, lobular neoplasia (atypical lobular hyperplasia), and fibrocystic changes with calcifications.   The patient's subsequent history is as detailed below.   PAST MEDICAL HISTORY: Past Medical History:  Diagnosis Date   Basal cell carcinoma 12/22/2019   left ant nasal ala crease - ED&C   Breast cancer ([place])    Family history of  breast cancer    Pulmonary embolus ([place]) 2020    PAST SURGICAL HISTORY: Past Surgical History:  Procedure Laterality  Date   AUGMENTATION MAMMAPLASTY Left 04/2019   BREAST BIOPSY Right 11/22/2018   atypical , negative breast cancer   BREAST BIOPSY Right 10/12/2020   BREAST RECONSTRUCTION WITH PLACEMENT OF TISSUE EXPANDER AND ALLODERM Left 11/22/2018   Procedure: LEFT BREAST RECONSTRUCTION WITH TISSUE EXPANDER AND ACELLULAR DERMIS;  Surgeon: [name], MD;  Location: [facility];  Service: Plastics;  Laterality: Left;   BREAST REDUCTION SURGERY Right 04/26/2019   Procedure: RIGHT BREAST REDUCTION;  Surgeon: [name], MD;  Location: [facility];  Service: Plastics;  Laterality: Right;   Broken Wrist  1999   CESAREAN SECTION     endometrial biopsy  06/2014   LIPOSUCTION WITH LIPOFILLING Left 04/26/2019   Procedure: LIPOFILLING FROM ABDOMEN TO LEFT CHEST;  Surgeon: [name], MD;  Location: [facility];  Service: Plastics;  Laterality: Left;   MASTECTOMY Left 11/22/2018   total/complete with tissue expander and implant    MASTECTOMY W/ SENTINEL NODE BIOPSY Left 11/22/2018   Procedure: LEFT TOTAL MASTECTOMY WITH LEFT AXILLARY SENTINEL LYMPH NODE BIOPSY;  Surgeon: Rolm Bookbinder, MD;  Location: [facility];  Service: General;  Laterality: Left;   RADIOACTIVE SEED GUIDED EXCISIONAL BREAST BIOPSY Right 11/22/2018   Procedure: RIGHT BREAST RADIOACTIVE SEED GUIDED EXCISIONAL BIOPSY;  Surgeon: Rolm Bookbinder, MD;  Location: [facility];  Service: General;  Laterality: Right;   REDUCTION MAMMAPLASTY Right 04/26/2019   REMOVAL OF TISSUE EXPANDER AND PLACEMENT OF IMPLANT Left 04/26/2019   Procedure: REMOVAL OF LEFT TISSUE EXPANDER AND PLACEMENT OF IMPLANT;  Surgeon: [name], MD;  Location: [facility];  Service: Plastics;  Laterality: Left;    FAMILY HISTORY: Family History   Problem Relation Age of Onset   Breast cancer Mother 65       died 26; "negative genetic testing"   Diabetes Father    Hypertension Father    Hydrocephalus Brother    Breast cancer Maternal Grandmother 46       d. 71   Breast cancer Paternal Grandmother 31       d. 28   Breast cancer Paternal Aunt 90   Breast cancer Paternal Aunt 23       "genetic testing negative"   Other Maternal Grandfather        WWII   Lymphoma Paternal Grandfather   Tarrie's father died from heart complications following a myocardial infarction at age 25. Patients' mother died from metastatic breast cancer at age 79. The patient has 1 brother and 1 sister. Robbi's mother was diagnosed with breast cancer at age 66. Ambria's paternal grandmother, maternal grandmother, and two paternal aunts were diagnosed with breast cancer. Patient denies anyone in her family having ovarian, prostate, or pancreatic cancer. Leightyn's paternal grandfather was diagnosed with lymphoma.    GYNECOLOGIC HISTORY:  Patient's last menstrual period was 07/14/2017. 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 She is divorced. She lives alone with her dog, a Engineer, petroleum mix (150 lbs). Her significant other, [name], works for the [employer] at a water treatment facility. Arianie has one child, [name], who is 14, lives in [place], and graduated from [employer] with a degree in [employer]. [name] is currently working a Insurance claims handler.  ADVANCED  DIRECTIVES: Not in place. Sabra was given the appropriate healthcare power of attorney form at the 10/20/2018 visit to fill out and return at her discretion   HEALTH MAINTENANCE: Social History   Tobacco Use   Smoking status: Former    Types:  Cigarettes    Quit date: 09/08/1993    Years since quitting: 29.2   Smokeless tobacco: Never  Substance Use Topics   Alcohol use: Not Currently   Drug use: No    Colonoscopy: no  PAP: yes, 09/10/2018  Bone density: no   Allergies  Allergen Reactions   Sulfa Antibiotics Other (See Comments)    Pt unsure   Penicillins Rash    Did it involve swelling of the face/tongue/throat, SOB, or low BP? No Did it involve sudden or severe rash/hives, skin peeling, or any reaction on the inside of your mouth or nose? Yes Did you need to seek medical attention at a hospital or doctor's office? No When did it last happen?      unknown If all above answers are "NO", may proceed with cephalosporin use.     Current Outpatient Medications  Medication Sig Dispense Refill   anastrozole (ARIMIDEX) 1 MG tablet TAKE 1 TABLET BY MOUTH EVERY DAY 30 tablet 2   calcium carbonate (OSCAL) 1500 (600 Ca) MG TABS tablet Take 1 tablet (1,500 mg total) by mouth 2 (two) times daily with a meal.  0   Multiple Vitamin (MULTIVITAMIN) tablet Take 1 tablet by mouth daily.       VITAMIN D PO Take 1 capsule by mouth daily.      No current facility-administered medications for this visit.    OBJECTIVE: white woman in no acute distress  Vitals:   11/25/22 1436  BP: (!) 156/89  Pulse: 78  Resp: 16  Temp: 98.1 F (36.7 C)  SpO2: 98%      Body mass index is 41.49 kg/m.   Wt Readings from Last 3 Encounters:  11/25/22 234 lb 3.2 oz (106.2 kg)  05/27/22 234 lb 12.8 oz (106.5 kg)  03/17/22 236 lb (107 kg)     ECOG FS:1 - Symptomatic but completely ambulatory  Physical Exam Constitutional:      Appearance: Normal appearance.  Chest:     Comments: Right breast status postreduction mammoplasty.  No concern for recurrence.  Left breast status post mastectomy and implant.  No concern for recurrence or regional adenopathy. Musculoskeletal:     Cervical back: Normal range of motion and neck supple. No rigidity.   Lymphadenopathy:     Cervical: No cervical adenopathy.  Neurological:     Mental Status: She is alert.       LAB RESULTS:  CMP     Component Value Date/Time   NA 140 05/27/2022 1436   NA 141 08/06/2017 0927   K 4.2 05/27/2022 1436   CL 104 05/27/2022 1436   CO2 31 05/27/2022 1436   GLUCOSE 89 05/27/2022 1436   BUN 13 05/27/2022 1436   BUN 9 08/06/2017 0927   CREATININE 0.66 05/27/2022 1436   CREATININE 0.74 07/30/2016 1049   CALCIUM 9.5 05/27/2022 1436   PROT 7.3 05/27/2022 1436   PROT 7.1 08/06/2017 0927   ALBUMIN 4.8 05/27/2022 1436   ALBUMIN 4.3 08/06/2017 0927   AST 23 05/27/2022 1436   ALT 30 05/27/2022 1436   ALKPHOS 62 05/27/2022 1436   BILITOT 0.3 05/27/2022 1436   GFRNONAA >60 05/27/2022 1436   GFRAA >60 02/20/2020 1431   GFRAA >60  10/20/2018 0831    No results found for: "TOTALPROTELP", "ALBUMINELP", "A1GS", "A2GS", "BETS", "BETA2SER", "GAMS", "MSPIKE", "SPEI"  No results found for: "KPAFRELGTCHN", "LAMBDASER", "KAPLAMBRATIO"  Lab Results  Component Value Date   WBC 8.1 11/25/2022   NEUTROABS 5.5 11/25/2022   HGB 12.4 11/25/2022   HCT 37.0 11/25/2022   MCV 87.7 11/25/2022   PLT 300 11/25/2022   No results found for: "LABCA2"  No components found for: "[id number]"  No results for input(s): "INR" in the last 168 hours.  No results found for: "LABCA2"  No results found for: "[id number]"  No results found for: "CAN125"  No results found for: "CAN153"  No results found for: "CA2729"  No components found for: "HGQUANT"  No results found for: "CEA1", "CEA" / No results found for: "CEA1", "CEA"   No results found for: "AFPTUMOR"  No results found for: "CHROMOGRNA"  No results found for: "HGBA", "HGBA2QUANT", "HGBFQUANT", "HGBSQUAN" (Hemoglobinopathy evaluation)   No results found for: "LDH"  Lab Results  Component Value Date   IRON 65 07/30/2016   TIBC 439 07/30/2016   IRONPCTSAT 15 07/30/2016   (Iron and TIBC)  Lab Results   Component Value Date   FERRITIN 33 07/30/2016    Urinalysis    Component Value Date/Time   COLORURINE YELLOW 07/29/2013 1501   APPEARANCEUR CLEAR 07/29/2013 1501   LABSPEC 1.005 07/29/2013 1501   PHURINE 6.5 07/29/2013 1501   GLUCOSEU NEG 07/29/2013 1501   HGBUR NEG 07/29/2013 1501   BILIRUBINUR n 07/30/2016 0840   KETONESUR NEG 07/29/2013 1501   PROTEINUR n 07/30/2016 0840   PROTEINUR NEG 07/29/2013 1501   UROBILINOGEN negative 07/30/2016 0840   UROBILINOGEN 0.2 07/29/2013 1501   NITRITE n 07/30/2016 0840   NITRITE NEG 07/29/2013 1501   LEUKOCYTESUR Negative 07/30/2016 0840    STUDIES:  MM DIAG BREAST TOMO UNI RIGHT  Result Date: 11/04/2022 CLINICAL DATA:  Patient with history of right breast ALH, for follow-up evaluation. EXAM: DIGITAL DIAGNOSTIC UNILATERAL RIGHT MAMMOGRAM WITH TOMOSYNTHESIS TECHNIQUE: Right digital diagnostic mammography and breast tomosynthesis was performed. COMPARISON:  Previous exam(s). ACR Breast Density Category c: The breasts are heterogeneously dense, which may obscure small masses. FINDINGS: Stable biopsy site within the inner right breast. No new masses, calcifications or nonsurgical distortion identified within the right breast. IMPRESSION: Stable biopsy site medial right breast previously demonstrating ALH. Given stability over time, patient can return to screening mammography. No mammographic evidence for malignancy. RECOMMENDATION: Screening mammogram in one year.(Code:[id number]-B-01Y) I have discussed the findings and recommendations with the patient. If applicable, a reminder letter will be sent to the patient regarding the next appointment. BI-RADS CATEGORY  2: Benign. Electronically Signed   By: Lovey Newcomer M.D.   On: 11/04/2022 08:59    ELIGIBLE FOR AVAILABLE RESEARCH PROTOCOL: no   ASSESSMENT: 57 y.o. 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  (2) tamoxifen started neoadjuvantly 10/20/2018, stopped 11/26/2018 (a week prior to surgery) and not resumed postop due to development of DVT/PE  (3) status post left total mastectomy with sentinel lymph node sampling 11/22/2018 for a pT1a pN0, stage IA invasive ductal carcinoma, grade 1, with negative margins  (a) a total of 3 sentinel and 2 intramammary lymph nodes removed  (  b) immediate left expander placement followed by saline implant placement 04/26/2019  (4) status post right lumpectomy 11/22/2018 showing atypical ductal hyperplasia  (5) multifocal acute segmental and subsegmental pulmonary embolism documented by CT angiogram 12/03/2018; no hypotension, negative for right heart strain  (a) IV heparin 12/03/2018 through 12/06/2018  (b) started apixaban 12/06/2018, 10 mg BID x 7 d, then 5 mg BID  (c) D-dimer 03/15/2019 = less than 0.27  (d) stopped apixaban 04/08/2019  (e) repeat d-dimer October 2020 WNL  (6) Genetic testing through [facility] Common Hereditary Cancers Panel completed on 12/03/2017 showed no deleterious mutations in: APC, ATM, AXIN2, BARD1, BMPR1A, BRCA1, BRCA2, BRIP1, CDH1, CDK4, CDKN2A (p14ARF), CDKN2A (p16INK4a), CHEK2, CTNNA1, DICER1, EPCAM*, GREM1*, KIT, MEN1, MLH1, MSH2, MSH3, MSH6, MUTYH, NBN, NF1, PALB2, PDGFRA, PMS2, POLD1, POLE, PTEN, RAD50, RAD51C, RAD51D, SDHB, SDHC, SDHD, SMAD4, SMARCA4, STK11, TP53, TSC1, TSC2, VHL The following genes were evaluated for sequence changes only: HOXB13*, NTHL1*, SDHA.  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  (7) started anastrozole 04/09/2019  (a) repeated [place] measurements confirm postmenopausal status  (b) bone density 11/23/2019 shows a T score of  0.6   PLAN:  Ms. Chealsey is here for follow-up on anastrozole.  She has been taking this since August 2020 and has been tolerating it well.  She does have some mild hot flashes and vaginal dryness which are tolerable.  Since her last visit here, she denies any new symptoms. No evidence of recurrence on physical examination today. Mammogram from November 04, 2022 with stable biopsy site medial right breast previously demonstrating [place], given stability over time patient can return to screening mammography.  No mammographic evidence for malignancy. Bone density in September 2023 with a T-score of -1.1, mild osteopenia or low bone mass. We once again discussed about calcium/vitamin D supplementation and weightbearing exercises, she understands that this can get worse with anastrozole She will continue all till August 2025. She was offered to return to clinic in 1 year but she prefers continuing every 6 months. Total encounter time 30 minutes.*  *Total Encounter Time as defined by the Centers for Medicare and Medicaid Services includes, in addition to the face-to-face time of a patient visit (documented in the note above) non-face-to-face time: obtaining and reviewing outside history, ordering and reviewing medications, tests or procedures, care coordination (communications with other health care professionals or caregivers) and documentation in the medical record.

## 2022-11-26 ENCOUNTER — Telehealth: Payer: Self-pay | Admitting: Hematology and Oncology

## 2022-11-26 NOTE — Telephone Encounter (Signed)
Spoke with patient confirming upcoming appointments  

## 2023-01-28 ENCOUNTER — Other Ambulatory Visit: Payer: Self-pay | Admitting: Hematology and Oncology

## 2023-04-28 ENCOUNTER — Other Ambulatory Visit: Payer: Self-pay | Admitting: Hematology and Oncology

## 2023-05-29 ENCOUNTER — Inpatient Hospital Stay: Payer: 59 | Attending: Hematology and Oncology | Admitting: Hematology and Oncology

## 2023-05-29 ENCOUNTER — Inpatient Hospital Stay: Payer: 59

## 2023-05-29 VITALS — BP 139/86 | HR 79 | Temp 98.1°F | Resp 17 | Wt 235.1 lb

## 2023-05-29 DIAGNOSIS — Z87891 Personal history of nicotine dependence: Secondary | ICD-10-CM | POA: Insufficient documentation

## 2023-05-29 DIAGNOSIS — Z807 Family history of other malignant neoplasms of lymphoid, hematopoietic and related tissues: Secondary | ICD-10-CM | POA: Insufficient documentation

## 2023-05-29 DIAGNOSIS — Z86718 Personal history of other venous thrombosis and embolism: Secondary | ICD-10-CM | POA: Diagnosis not present

## 2023-05-29 DIAGNOSIS — Z803 Family history of malignant neoplasm of breast: Secondary | ICD-10-CM | POA: Insufficient documentation

## 2023-05-29 DIAGNOSIS — Z86711 Personal history of pulmonary embolism: Secondary | ICD-10-CM | POA: Diagnosis not present

## 2023-05-29 DIAGNOSIS — Z170 Estrogen receptor positive status [ER+]: Secondary | ICD-10-CM | POA: Insufficient documentation

## 2023-05-29 DIAGNOSIS — Z923 Personal history of irradiation: Secondary | ICD-10-CM | POA: Diagnosis not present

## 2023-05-29 DIAGNOSIS — N6091 Unspecified benign mammary dysplasia of right breast: Secondary | ICD-10-CM | POA: Insufficient documentation

## 2023-05-29 DIAGNOSIS — Z9012 Acquired absence of left breast and nipple: Secondary | ICD-10-CM | POA: Diagnosis not present

## 2023-05-29 DIAGNOSIS — C50412 Malignant neoplasm of upper-outer quadrant of left female breast: Secondary | ICD-10-CM | POA: Diagnosis not present

## 2023-05-29 DIAGNOSIS — Z79811 Long term (current) use of aromatase inhibitors: Secondary | ICD-10-CM | POA: Insufficient documentation

## 2023-05-29 DIAGNOSIS — M8580 Other specified disorders of bone density and structure, unspecified site: Secondary | ICD-10-CM | POA: Diagnosis not present

## 2023-05-29 LAB — CMP (CANCER CENTER ONLY)
ALT: 42 U/L (ref 0–44)
AST: 24 U/L (ref 15–41)
Albumin: 4.9 g/dL (ref 3.5–5.0)
Alkaline Phosphatase: 61 U/L (ref 38–126)
Anion gap: 6 (ref 5–15)
BUN: 17 mg/dL (ref 6–20)
CO2: 31 mmol/L (ref 22–32)
Calcium: 10 mg/dL (ref 8.9–10.3)
Chloride: 103 mmol/L (ref 98–111)
Creatinine: 0.65 mg/dL (ref 0.44–1.00)
GFR, Estimated: >60 mL/min (ref >60)
Glucose, Bld: 86 mg/dL (ref 70–99)
Potassium: 4 mmol/L (ref 3.5–5.1)
Sodium: 140 mmol/L (ref 135–145)
Total Bilirubin: 0.4 mg/dL (ref 0.3–1.2)
Total Protein: 7.8 g/dL (ref 6.5–8.1)

## 2023-05-29 LAB — CBC WITH DIFFERENTIAL/PLATELET
Abs Immature Granulocytes: 0.03 10*3/uL (ref 0.00–0.07)
Basophils Absolute: 0.1 10*3/uL (ref 0.0–0.1)
Basophils Relative: 1 %
Eosinophils Absolute: 0.2 10*3/uL (ref 0.0–0.5)
Eosinophils Relative: 2 %
HCT: 37.3 % (ref 36.0–46.0)
Hemoglobin: 12.7 g/dL (ref 12.0–15.0)
Immature Granulocytes: 0 %
Lymphocytes Relative: 17 %
Lymphs Abs: 1.5 10*3/uL (ref 0.7–4.0)
MCH: 29.5 pg (ref 26.0–34.0)
MCHC: 34 g/dL (ref 30.0–36.0)
MCV: 86.7 fL (ref 80.0–100.0)
Monocytes Absolute: 0.7 10*3/uL (ref 0.1–1.0)
Monocytes Relative: 8 %
Neutro Abs: 6.4 10*3/uL (ref 1.7–7.7)
Neutrophils Relative %: 72 %
Platelets: 314 10*3/uL (ref 150–400)
RBC: 4.3 MIL/uL (ref 3.87–5.11)
RDW: 12 % (ref 11.5–15.5)
WBC: 8.8 10*3/uL (ref 4.0–10.5)
nRBC: 0 % (ref 0.0–0.2)

## 2023-05-29 NOTE — Progress Notes (Signed)
[facility]  [phone] [phone]    ID: Jennifer Harrison DOB: September 13, 1965  MR#: [id number]  ZDG#:[id number]  Patient Care Team: Patient, [name] as PCP - General (General Practice) Emelia Loron, MD as Consulting Physician (General Surgery) Magrinat, Valentino Hue, MD (Inactive) as Consulting Physician (Oncology) Lonie Peak, MD as Attending Physician (Radiation Oncology) Donnelly Angelica, RN as Oncology Nurse Navigator Pershing Proud, RN as Oncology Nurse Navigator Glenna Fellows, MD as Consulting Physician (Plastic Surgery)  CHIEF COMPLAINT: Estrogen receptor positive breast cancer (s/p left mastectomy)  CURRENT TREATMENT: Anastrozole  INTERVAL HISTORY:  Olee returns today for follow-up of her estrogen receptor positive breast cancer. She was started on anastrozole on 04/09/2019.  She has been taking this as prescribed.  She has noticed some left axillary pain, which feels like she may have overstretched her arm. She denies any new health complaints today otherwise. Left axillary pain is also subsiding, she wants to wait and see if this will improve. Rest of the pertinent 10 point ROS reviewed and negative   COVID 19 VACCINATION STATUS: unvaccinated; had COVID OCT 2020   HISTORY OF CURRENT ILLNESS: From the original intake note:  BARABARA Harrison underwent one-year follow up bilateral diagnostic mammography with tomography for benign right breast calcifications at [facility] on 10/04/2018 showing: Breast Density Category B. There are new calcifications in the upper outer and retroareolar left breast.. Magnification views confirm pleomorphic calcifications spanning approximately 10 cm anterior to posterior diameter, by 8 cm craniocaudal span by approximately 7 cm transverse diameter. While the majority of the calcifications are in the upper-outer quadrant, there are also suspicious calcifications in the central and medial  retroareolar left breast, upper inner quadrant. No suspicious mass or distortion is identified in the left breast.   Accordingly on 10/08/2018 she proceeded to biopsies of the left breast areas in question. 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 Prognostic indicators significant for: estrogen receptor, 50% positive with moderate staining intensity and progesterone receptor, 50% positive with moderate-weak staining intensity. Proliferation marker Ki67 at <1%. HER2 negative (1+) by immunohistochemistry.   She underwent an additional left breast biopsy on the same day. The pathology from this retroareolar upper outer quadrant procedure showed ([id number]): ductal carcinoma in situ with calcifications, intermediate grade.  Prognostic panel was not obtained on this tissue.  Then, she underwent an ultrasound of bilateral axillae on 10/14/2018 showing no abnormalities within either axilla. No abnormal lymph nodes are identified.   Magnification views of the right breast show mammographically stable predominately punctate calcifications in the upper inner quadrant, a few of which demonstrate layering. There are additional scattered calcifications in the inner inner right breast. No suspicious mass or architectural distortion on the right.    Finally, she underwent a biopsy of the right breast area in question on 10/14/2018. The pathology from this procedure showed ([id number]): atypical ductal hyperplasia with calcifications, lobular neoplasia (atypical lobular hyperplasia), and fibrocystic changes with calcifications.   The patient's subsequent history is as detailed below.   PAST MEDICAL HISTORY: Past Medical History:  Diagnosis Date   Basal cell carcinoma 12/22/2019   left ant nasal ala crease - ED&C   Breast cancer (HCC)    Family history of breast cancer    Pulmonary embolus (HCC)  2020    PAST SURGICAL HISTORY: Past Surgical History:  Procedure Laterality Date   AUGMENTATION MAMMAPLASTY  Left 04/2019   BREAST BIOPSY Right 11/22/2018   atypical , negative breast cancer   BREAST BIOPSY Right 10/12/2020   BREAST RECONSTRUCTION WITH PLACEMENT OF TISSUE EXPANDER AND ALLODERM Left 11/22/2018   Procedure: LEFT BREAST RECONSTRUCTION WITH TISSUE EXPANDER AND ACELLULAR DERMIS;  Surgeon: Glenna Fellows, MD;  Location: [facility];  Service: Plastics;  Laterality: Left;   BREAST REDUCTION SURGERY Right 04/26/2019   Procedure: RIGHT BREAST REDUCTION;  Surgeon: Glenna Fellows, MD;  Location: [facility];  Service: Plastics;  Laterality: Right;   Broken Wrist  1999   CESAREAN SECTION     endometrial biopsy  06/2014   LIPOSUCTION WITH LIPOFILLING Left 04/26/2019   Procedure: LIPOFILLING FROM ABDOMEN TO LEFT CHEST;  Surgeon: Glenna Fellows, MD;  Location: [facility];  Service: Plastics;  Laterality: Left;   MASTECTOMY Left 11/22/2018   total/complete with tissue expander and implant    MASTECTOMY W/ SENTINEL NODE BIOPSY Left 11/22/2018   Procedure: LEFT TOTAL MASTECTOMY WITH LEFT AXILLARY SENTINEL LYMPH NODE BIOPSY;  Surgeon: Emelia Loron, MD;  Location: [facility];  Service: General;  Laterality: Left;   RADIOACTIVE SEED GUIDED EXCISIONAL BREAST BIOPSY Right 11/22/2018   Procedure: RIGHT BREAST RADIOACTIVE SEED GUIDED EXCISIONAL BIOPSY;  Surgeon: Emelia Loron, MD;  Location: [facility];  Service: General;  Laterality: Right;   REDUCTION MAMMAPLASTY Right 04/26/2019   REMOVAL OF TISSUE EXPANDER AND PLACEMENT OF IMPLANT Left 04/26/2019   Procedure: REMOVAL OF LEFT TISSUE EXPANDER AND PLACEMENT OF IMPLANT;  Surgeon: Glenna Fellows, MD;  Location:  [facility];  Service: Plastics;  Laterality: Left;    FAMILY HISTORY: Family History  Problem Relation Age of Onset   Breast  cancer Mother 26       died 55; "negative genetic testing"   Diabetes Father    Hypertension Father    Hydrocephalus Brother    Breast cancer Maternal Grandmother 84       d. 55   Breast cancer Paternal Grandmother 85       d. 26   Breast cancer Paternal Aunt 7   Breast cancer Paternal Aunt 12       "genetic testing negative"   Other Maternal Grandfather        WWII   Lymphoma Paternal Grandfather   Natalynn's father died from heart complications following a myocardial infarction at age 26. Patients' mother died from metastatic breast cancer at age 18. The patient has 1 brother and 1 sister. Evelina's mother was diagnosed with breast cancer at age 17. Pattye's paternal grandmother, maternal grandmother, and two paternal aunts were diagnosed with breast cancer. Patient denies anyone in her family having ovarian, prostate, or pancreatic cancer. Analyce's paternal grandfather was diagnosed with lymphoma.    GYNECOLOGIC HISTORY:  Patient's last menstrual period was 07/14/2017. 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 She is divorced. She lives alone with her dog, a Environmental consultant mix (150 lbs). Her significant other, Monte Fantasia, works for the [employer] at a water treatment facility. Carmia has one child, Lyndle Herrlich, who is 88, lives in [place], and graduated from [employer] with a degree in [employer]. Lyndle Herrlich is currently working a Airline pilot.  ADVANCED DIRECTIVES: Not in place. Liborio Nixon  was given the appropriate healthcare power of attorney form at the 10/20/2018 visit to fill out and return at her discretion   HEALTH MAINTENANCE: Social History   Tobacco Use   Smoking status: Former    Current packs/day: 0.00    Types: Cigarettes     Quit date: 09/08/1993    Years since quitting: 29.7   Smokeless tobacco: Never  Substance Use Topics   Alcohol use: Not Currently   Drug use: No    Colonoscopy: no  PAP: yes, 09/10/2018  Bone density: no   Allergies  Allergen Reactions   Sulfa Antibiotics Other (See Comments)    Pt unsure   Penicillins Rash    Did it involve swelling of the face/tongue/throat, SOB, or low BP? No Did it involve sudden or severe rash/hives, skin peeling, or any reaction on the inside of your mouth or nose? Yes Did you need to seek medical attention at a hospital or doctor's office? No When did it last happen?      unknown If all above answers are "NO", may proceed with cephalosporin use.     Current Outpatient Medications  Medication Sig Dispense Refill   anastrozole (ARIMIDEX) 1 MG tablet TAKE 1 TABLET BY MOUTH EVERY DAY 30 tablet 2   calcium carbonate (OSCAL) 1500 (600 Ca) MG TABS tablet Take 1 tablet (1,500 mg total) by mouth 2 (two) times daily with a meal.  0   Multiple Vitamin (MULTIVITAMIN) tablet Take 1 tablet by mouth daily.       VITAMIN D PO Take 1 capsule by mouth daily.      No current facility-administered medications for this visit.    OBJECTIVE: white woman in no acute distress  Vitals:   05/29/23 1152  BP: 139/86  Pulse: 79  Resp: 17  Temp: 98.1 F (36.7 C)  SpO2: 98%      Body mass index is 41.65 kg/m.   Wt Readings from Last 3 Encounters:  05/29/23 235 lb 1.6 oz (106.6 kg)  11/25/22 234 lb 3.2 oz (106.2 kg)  05/27/22 234 lb 12.8 oz (106.5 kg)     ECOG FS:1 - Symptomatic but completely ambulatory  Physical Exam Constitutional:      Appearance: Normal appearance.  Chest:     Comments: Right breast status postreduction mammoplasty.  No concern for recurrence.  Left breast status post mastectomy and implant.  No concern for recurrence or regional adenopathy. Musculoskeletal:     Cervical back: Normal range of motion and neck supple. No rigidity.   Lymphadenopathy:     Cervical: No cervical adenopathy.  Neurological:     Mental Status: She is alert.       LAB RESULTS:  CMP     Component Value Date/Time   NA 142 11/25/2022 1416   NA 141 08/06/2017 0927   K 4.5 11/25/2022 1416   CL 104 11/25/2022 1416   CO2 33 (H) 11/25/2022 1416   GLUCOSE 98 11/25/2022 1416   BUN 14 11/25/2022 1416   BUN 9 08/06/2017 0927   CREATININE 0.64 11/25/2022 1416   CREATININE 0.74 07/30/2016 1049   CALCIUM 9.7 11/25/2022 1416   PROT 7.3 11/25/2022 1416   PROT 7.1 08/06/2017 0927   ALBUMIN 4.8 11/25/2022 1416   ALBUMIN 4.3 08/06/2017 0927   AST 20 11/25/2022 1416   ALT 30 11/25/2022 1416   ALKPHOS 55 11/25/2022 1416   BILITOT 0.3 11/25/2022 1416   GFRNONAA >60 11/25/2022 1416   GFRAA >60 02/20/2020 1431  GFRAA >60 10/20/2018 0831    No results found for: "TOTALPROTELP", "ALBUMINELP", "A1GS", "A2GS", "BETS", "BETA2SER", "GAMS", "MSPIKE", "SPEI"  No results found for: "KPAFRELGTCHN", "LAMBDASER", "KAPLAMBRATIO"  Lab Results  Component Value Date   WBC 8.1 11/25/2022   NEUTROABS 5.5 11/25/2022   HGB 12.4 11/25/2022   HCT 37.0 11/25/2022   MCV 87.7 11/25/2022   PLT 300 11/25/2022   No results found for: "LABCA2"  No components found for: "UUVOZD664"  No results for input(s): "INR" in the last 168 hours.  No results found for: "LABCA2"  No results found for: "QIH474"  No results found for: "CAN125"  No results found for: "CAN153"  No results found for: "CA2729"  No components found for: "HGQUANT"  No results found for: "CEA1", "CEA" / No results found for: "CEA1", "CEA"   No results found for: "AFPTUMOR"  No results found for: "CHROMOGRNA"  No results found for: "HGBA", "HGBA2QUANT", "HGBFQUANT", "HGBSQUAN" (Hemoglobinopathy evaluation)   No results found for: "LDH"  Lab Results  Component Value Date   IRON 65 07/30/2016   TIBC 439 07/30/2016   IRONPCTSAT 15 07/30/2016   (Iron and TIBC)  Lab Results   Component Value Date   FERRITIN 33 07/30/2016    Urinalysis    Component Value Date/Time   COLORURINE YELLOW 07/29/2013 1501   APPEARANCEUR CLEAR 07/29/2013 1501   LABSPEC 1.005 07/29/2013 1501   PHURINE 6.5 07/29/2013 1501   GLUCOSEU NEG 07/29/2013 1501   HGBUR NEG 07/29/2013 1501   BILIRUBINUR n 07/30/2016 0840   KETONESUR NEG 07/29/2013 1501   PROTEINUR n 07/30/2016 0840   PROTEINUR NEG 07/29/2013 1501   UROBILINOGEN negative 07/30/2016 0840   UROBILINOGEN 0.2 07/29/2013 1501   NITRITE n 07/30/2016 0840   NITRITE NEG 07/29/2013 1501   LEUKOCYTESUR Negative 07/30/2016 0840    STUDIES:  No results found.   ELIGIBLE FOR AVAILABLE RESEARCH PROTOCOL: no   ASSESSMENT: 57 y.o. [place], [place] status post left breast upper outer quadrant biopsy 10/08/2018 for a clinical TX N0 invasive ductal carcinoma, grade 1, estrogen and progesterone receptor positive, HER-2 nonamplified, with an MIB-1 of less than 1%  (1) status post right breast upper inner quadrant biopsy 10/14/2018 showing atypical ductal hyperplasia and atypical lobular hyperplasia  (2) tamoxifen started neoadjuvantly 10/20/2018, stopped 11/26/2018 (a week prior to surgery) and not resumed postop due to development of DVT/PE  (3) status post left total mastectomy with sentinel lymph node sampling 11/22/2018 for a pT1a pN0, stage IA invasive ductal carcinoma, grade 1, with negative margins  (a) a total of 3 sentinel and 2 intramammary lymph nodes removed  (b) immediate left expander placement followed by saline implant placement 04/26/2019  (4) status post right lumpectomy 11/22/2018 showing atypical ductal hyperplasia  (5) multifocal acute segmental and subsegmental pulmonary embolism documented by CT angiogram 12/03/2018; no hypotension, negative for right heart strain  (a) IV heparin 12/03/2018 through 12/06/2018  (b) started apixaban 12/06/2018, 10 mg BID x 7 d, then 5 mg BID  (c) D-dimer 03/15/2019 = less than  0.27  (d) stopped apixaban 04/08/2019  (e) repeat d-dimer October 2020 WNL  (6) Genetic testing through [facility] Common Hereditary Cancers Panel completed on 12/03/2017 showed no deleterious mutations in: APC, ATM, AXIN2, BARD1, BMPR1A, BRCA1, BRCA2, BRIP1, CDH1, CDK4, CDKN2A (p14ARF), CDKN2A (p16INK4a), CHEK2, CTNNA1, DICER1, EPCAM*, GREM1*, KIT, MEN1, MLH1, MSH2, MSH3, MSH6, MUTYH, NBN, NF1, PALB2, PDGFRA, PMS2, POLD1, POLE, PTEN, RAD50, RAD51C, RAD51D, SDHB, SDHC, SDHD, SMAD4, SMARCA4, STK11, TP53, TSC1, TSC2, VHL The  following genes were evaluated for sequence changes only: HOXB13*, NTHL1*, SDHA.  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 No palpable lumps. Pain is improving and may be related to stretching or muscle strain. -Plan to follow up in 1 month via phone call to assess if pain has resolved. If not, consider ordering an ultrasound.  Breast Cancer Surveillance Patient is on Anastrozole since August 2020 following radiation therapy for breast cancer. No new changes or concerns. -Continue Anastrozole as prescribed. -Mammogram from November 04, 2022 with stable biopsy site medial right breast previously demonstrating ALH, given stability over time patient can return to screening mammography.  No mammographic evidence for malignancy. -Next mammogram scheduled for February 2025. -Plan to see patient in 6 months for routine follow-up.  Bone Health Mild osteopenia noted. Patient is on Vitamin D and is advised to increase physical activity for bone health. -Encourage patient to walk 30 minutes a day, 5 days a week. -Continue Vitamin D supplementation.  General Health Maintenance -Order labs today  to check liver enzymes and other routine parameters. -Plan to stretch out appointments to once a year after the next 36-month visit.  RTC in 6 months per pt preference Labs today. Total encounter time 30 minutes.*  *Total Encounter Time as defined by the Centers for Medicare and Medicaid Services includes, in addition to the face-to-face time of a patient visit (documented in the note above) non-face-to-face time: obtaining and reviewing outside history, ordering and reviewing medications, tests or procedures, care coordination (communications with other health care professionals or caregivers) and documentation in the medical record.

## 2023-06-26 ENCOUNTER — Inpatient Hospital Stay: Payer: 59 | Attending: Hematology and Oncology | Admitting: Hematology and Oncology

## 2023-06-26 DIAGNOSIS — C50412 Malignant neoplasm of upper-outer quadrant of left female breast: Secondary | ICD-10-CM

## 2023-06-26 DIAGNOSIS — Z170 Estrogen receptor positive status [ER+]: Secondary | ICD-10-CM

## 2023-06-26 NOTE — Progress Notes (Unsigned)
[facility]  [phone] [phone]) [phone]    ID: Jennifer Harrison DOB: [birth year]  MR#: [id number]  HKV#:[id number]  Patient Care Team: Patient, [name] as PCP - General (General Practice) Emelia Loron, MD as Consulting Physician (General Surgery) Magrinat, Valentino Hue, MD (Inactive) as Consulting Physician (Oncology) Lonie Peak, MD as Attending Physician (Radiation Oncology) Donnelly Angelica, RN as Oncology Nurse Navigator Pershing Proud, RN as Oncology Nurse Navigator Glenna Fellows, MD as Consulting Physician (Plastic Surgery)  CHIEF COMPLAINT: Estrogen receptor positive breast cancer (s/p left mastectomy)  CURRENT TREATMENT: Anastrozole  INTERVAL HISTORY:  Ruhee returns today for follow-up of her estrogen receptor positive breast cancer. She was started on anastrozole on 04/09/2019.  This was a follow up on her left axillary pain, she says it has been getting better compared to her last visit although it may not have completely resolved. She otherwise denies any health issues today   COVID 19 VACCINATION STATUS: unvaccinated; had COVID OCT 2020   HISTORY OF CURRENT ILLNESS: From the original intake note:  ELAURA NACHTMAN underwent one-year follow up bilateral diagnostic mammography with tomography for benign right breast calcifications at [facility] on 10/04/2018 showing: Breast Density Category B. There are new calcifications in the upper outer and retroareolar left breast.. Magnification views confirm pleomorphic calcifications spanning approximately 10 cm anterior to posterior diameter, by 8 cm craniocaudal span by approximately 7 cm transverse diameter. While the majority of the calcifications are in the upper-outer quadrant, there are also suspicious calcifications in the central and medial retroareolar left breast, upper inner quadrant. No suspicious mass or distortion is identified in the left breast.   Accordingly  on 10/08/2018 she proceeded to biopsies of the left breast areas in question. 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 Prognostic indicators significant for: estrogen receptor, 50% positive with moderate staining intensity and progesterone receptor, 50% positive with moderate-weak staining intensity. Proliferation marker Ki67 at <1%. HER2 negative (1+) by immunohistochemistry.   She underwent an additional left breast biopsy on the same day. The pathology from this retroareolar upper outer quadrant procedure showed ([id number]): ductal carcinoma in situ with calcifications, intermediate grade.  Prognostic panel was not obtained on this tissue.  Then, she underwent an ultrasound of bilateral axillae on 10/14/2018 showing no abnormalities within either axilla. No abnormal lymph nodes are identified.   Magnification views of the right breast show mammographically stable predominately punctate calcifications in the upper inner quadrant, a few of which demonstrate layering. There are additional scattered calcifications in the inner inner right breast. No suspicious mass or architectural distortion on the right.    Finally, she underwent a biopsy of the right breast area in question on 10/14/2018. The pathology from this procedure showed ([id number]): atypical ductal hyperplasia with calcifications, lobular neoplasia (atypical lobular hyperplasia), and fibrocystic changes with calcifications.   The patient's subsequent history is as detailed below.   PAST MEDICAL HISTORY: Past Medical History:  Diagnosis Date   Basal cell carcinoma 12/22/2019   left ant nasal ala crease - ED&C   Breast cancer (HCC)    Family history of breast cancer    Pulmonary embolus (HCC) 2020    PAST SURGICAL HISTORY: Past Surgical History:  Procedure Laterality Date   AUGMENTATION MAMMAPLASTY Left 04/2019    BREAST BIOPSY Right 11/22/2018   atypical , negative breast cancer   BREAST BIOPSY Right 10/12/2020  BREAST RECONSTRUCTION WITH PLACEMENT OF TISSUE EXPANDER AND ALLODERM Left 11/22/2018   Procedure: LEFT BREAST RECONSTRUCTION WITH TISSUE EXPANDER AND ACELLULAR DERMIS;  Surgeon: Glenna Fellows, MD;  Location: [facility];  Service: Plastics;  Laterality: Left;   BREAST REDUCTION SURGERY Right 04/26/2019   Procedure: RIGHT BREAST REDUCTION;  Surgeon: Glenna Fellows, MD;  Location: [facility];  Service: Plastics;  Laterality: Right;   Broken Wrist  1999   CESAREAN SECTION     endometrial biopsy  06/2014   LIPOSUCTION WITH LIPOFILLING Left 04/26/2019   Procedure: LIPOFILLING FROM ABDOMEN TO LEFT CHEST;  Surgeon: Glenna Fellows, MD;  Location: [facility];  Service: Plastics;  Laterality: Left;   MASTECTOMY Left 11/22/2018   total/complete with tissue expander and implant    MASTECTOMY W/ SENTINEL NODE BIOPSY Left 11/22/2018   Procedure: LEFT TOTAL MASTECTOMY WITH LEFT AXILLARY SENTINEL LYMPH NODE BIOPSY;  Surgeon: Emelia Loron, MD;  Location: [facility];  Service: General;  Laterality: Left;   RADIOACTIVE SEED GUIDED EXCISIONAL BREAST BIOPSY Right 11/22/2018   Procedure: RIGHT BREAST RADIOACTIVE SEED GUIDED EXCISIONAL BIOPSY;  Surgeon: Emelia Loron, MD;  Location: [facility];  Service: General;  Laterality: Right;   REDUCTION MAMMAPLASTY Right 04/26/2019   REMOVAL OF TISSUE EXPANDER AND PLACEMENT OF IMPLANT Left 04/26/2019   Procedure: REMOVAL OF LEFT TISSUE EXPANDER AND PLACEMENT OF IMPLANT;  Surgeon: Glenna Fellows, MD;  Location: [facility];  Service: Plastics;  Laterality: Left;    FAMILY HISTORY: Family History  Problem Relation Age of Onset   Breast cancer Mother 63       died 62; "negative genetic testing"   Diabetes Father    Hypertension Father    Hydrocephalus Brother     Breast cancer Maternal Grandmother 67       d. 53   Breast cancer Paternal Grandmother 65       d. 77   Breast cancer Paternal Aunt 69   Breast cancer Paternal Aunt 5       "genetic testing negative"   Other Maternal Grandfather        WWII   Lymphoma Paternal Grandfather   Ramonda's father died from heart complications following a myocardial infarction at age 49. Patients' mother died from metastatic breast cancer at age 47. The patient has 1 brother and 1 sister. Cheronda's mother was diagnosed with breast cancer at age 54. Nohely's paternal grandmother, maternal grandmother, and two paternal aunts were diagnosed with breast cancer. Patient denies anyone in her family having ovarian, prostate, or pancreatic cancer. Maelle's paternal grandfather was diagnosed with lymphoma.    GYNECOLOGIC HISTORY:  Patient's last menstrual period was 07/14/2017. 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 She is divorced. She lives alone with her dog, a Environmental consultant mix (150 lbs). Her significant other, Monte Fantasia, works for the [employer] at a water treatment facility. [place]assum has one child, Lyndle Herrlich, who is 21, lives in [place], and graduated from [employer] with a degree in [employer]. Lyndle Herrlich is currently working a Airline pilot.  ADVANCED DIRECTIVES: Not in place. Misa was given the appropriate healthcare power of attorney form at the 10/20/2018 visit to fill out and return at her discretion  HEALTH MAINTENANCE: Social History   Tobacco Use   Smoking status: Former    Current packs/day: 0.00    Types: Cigarettes    Quit date: 09/08/1993    Years since quitting: 29.8   Smokeless tobacco: Never  Substance Use Topics   Alcohol use: Not  Currently   Drug use: No    Colonoscopy: no  PAP: yes, 09/10/2018  Bone density: no   Allergies  Allergen Reactions   Sulfa Antibiotics Other (See Comments)    Pt unsure   Penicillins Rash    Did it involve swelling of the face/tongue/throat, SOB, or low BP? No Did it involve sudden or severe rash/hives, skin peeling, or any reaction on the inside of your mouth or nose? Yes Did you need to seek medical attention at a hospital or doctor's office? No When did it last happen?      unknown If all above answers are "NO", may proceed with cephalosporin use.     Current Outpatient Medications  Medication Sig Dispense Refill   anastrozole (ARIMIDEX) 1 MG tablet TAKE 1 TABLET BY MOUTH EVERY DAY 30 tablet 2   calcium carbonate (OSCAL) 1500 (600 Ca) MG TABS tablet Take 1 tablet (1,500 mg total) by mouth 2 (two) times daily with a meal.  0   Multiple Vitamin (MULTIVITAMIN) tablet Take 1 tablet by mouth daily.       VITAMIN D PO Take 1 capsule by mouth daily.      No current facility-administered medications for this visit.    OBJECTIVE: white woman in no acute distress  There were no vitals filed for this visit.     There is no height or weight on file to calculate BMI.   Wt Readings from Last 3 Encounters:  05/29/23 235 lb 1.6 oz (106.6 kg)  11/25/22 234 lb 3.2 oz (106.2 kg)  05/27/22 234 lb 12.8 oz (106.5 kg)     ECOG FS:1 - Symptomatic but completely ambulatory  PE not done, telephone visit    LAB RESULTS:  CMP     Component Value Date/Time   NA 140 05/29/2023 1215   NA 141 08/06/2017 0927   K 4.0 05/29/2023 1215   CL 103 05/29/2023 1215   CO2 31 05/29/2023 1215   GLUCOSE 86 05/29/2023 1215   BUN 17 05/29/2023 1215   BUN 9 08/06/2017 0927   CREATININE 0.65 05/29/2023 1215   CREATININE 0.74 07/30/2016 1049   CALCIUM 10.0 05/29/2023 1215   PROT 7.8 05/29/2023 1215   PROT 7.1 08/06/2017 0927   ALBUMIN 4.9 05/29/2023 1215   ALBUMIN 4.3 08/06/2017 0927   AST 24  05/29/2023 1215   ALT 42 05/29/2023 1215   ALKPHOS 61 05/29/2023 1215   BILITOT 0.4 05/29/2023 1215   GFRNONAA >60 05/29/2023 1215   GFRAA >60 02/20/2020 1431   GFRAA >60 10/20/2018 0831    No results found for: "TOTALPROTELP", "ALBUMINELP", "A1GS", "A2GS", "BETS", "BETA2SER", "GAMS", "MSPIKE", "SPEI"  No results found for: "KPAFRELGTCHN", "LAMBDASER", "KAPLAMBRATIO"  Lab Results  Component Value Date   WBC 8.8 05/29/2023   NEUTROABS 6.4 05/29/2023   HGB 12.7 05/29/2023   HCT 37.3 05/29/2023   MCV 86.7 05/29/2023   PLT 314 05/29/2023   No results found for: "LABCA2"  No components found for: "NWGNFA213"  No results for input(s): "INR" in the last 168 hours.  No results found for: "LABCA2"  No results found for: "YQM578"  No results found for: "CAN125"  No results found for: "ION629"  No results found for: "CA2729"  No components found for: "HGQUANT"  No results found for: "CEA1", "CEA" / No results found for: "CEA1", "CEA"   No results found for: "AFPTUMOR"  No results found for: "CHROMOGRNA"  No results found for: "HGBA", "HGBA2QUANT", "HGBFQUANT", "HGBSQUAN" (Hemoglobinopathy evaluation)   No results found for: "LDH"  Lab Results  Component Value Date   IRON 65 07/30/2016   TIBC 439 07/30/2016   IRONPCTSAT 15 07/30/2016   (Iron and TIBC)  Lab Results  Component Value Date   FERRITIN 33 07/30/2016    Urinalysis    Component Value Date/Time   COLORURINE YELLOW 07/29/2013 1501   APPEARANCEUR CLEAR 07/29/2013 1501   LABSPEC 1.005 07/29/2013 1501   PHURINE 6.5 07/29/2013 1501   GLUCOSEU NEG 07/29/2013 1501   HGBUR NEG 07/29/2013 1501   BILIRUBINUR n 07/30/2016 0840   KETONESUR NEG 07/29/2013 1501   PROTEINUR n 07/30/2016 0840   PROTEINUR NEG 07/29/2013 1501   UROBILINOGEN negative 07/30/2016 0840   UROBILINOGEN 0.2 07/29/2013 1501   NITRITE n 07/30/2016 0840   NITRITE NEG 07/29/2013 1501   LEUKOCYTESUR Negative 07/30/2016 0840     STUDIES:  No results found.   ELIGIBLE FOR AVAILABLE RESEARCH PROTOCOL: no   ASSESSMENT: 57 y.o. 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  (2) tamoxifen started neoadjuvantly 10/20/2018, stopped 11/26/2018 (a week prior to surgery) and not resumed postop due to development of DVT/PE  (3) status post left total mastectomy with sentinel lymph node sampling 11/22/2018 for a pT1a pN0, stage IA invasive ductal carcinoma, grade 1, with negative margins  (a) a total of 3 sentinel and 2 intramammary lymph nodes removed  (b) immediate left expander placement followed by saline implant placement 04/26/2019  (4) status post right lumpectomy 11/22/2018 showing atypical ductal hyperplasia  (5) multifocal acute segmental and subsegmental pulmonary embolism documented by CT angiogram 12/03/2018; no hypotension, negative for right heart strain  (a) IV heparin 12/03/2018 through 12/06/2018  (b) started apixaban 12/06/2018, 10 mg BID x 7 d, then 5 mg BID  (c) D-dimer 03/15/2019 = less than 0.27  (d) stopped apixaban 04/08/2019  (e) repeat d-dimer October 2020 WNL  (6) Genetic testing through [facility] Common Hereditary Cancers Panel completed on 12/03/2017 showed no deleterious mutations in: APC, ATM, AXIN2, BARD1, BMPR1A, BRCA1, BRCA2, BRIP1, CDH1, CDK4, CDKN2A (p14ARF), CDKN2A (p16INK4a), CHEK2, CTNNA1, DICER1, EPCAM*, GREM1*, KIT, MEN1, MLH1, MSH2, MSH3, MSH6, MUTYH, NBN, NF1, PALB2, PDGFRA, PMS2, POLD1, POLE, PTEN, RAD50, RAD51C, RAD51D, SDHB, SDHC, SDHD, SMAD4, SMARCA4, STK11, TP53, TSC1, TSC2, VHL The following genes were evaluated for sequence changes only: HOXB13*, NTHL1*,  SDHA.  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 Recommend PT exercises previously demonstrated to help with stretching.  Breast Cancer Surveillance Patient is on Anastrozole since August 2020 following radiation therapy for breast cancer.  Left axillary pain has improved significantly,  She is agreeable to continue with mammogram planned for Feb 2025. She was however strongly encouraged  to call me with any worsening pain or new concerns. She expressed understanding.  RTC in 6 months per pt preference Labs today. Total encounter time 5 min  I connected with  Nolene Ebbs on 06/27/23 by a telephone application and verified that I am speaking with the correct person using two identifiers.   I discussed the limitations of evaluation and management by telemedicine. The patient expressed understanding and agreed to proceed.   *Total Encounter Time as defined by the Centers for Medicare and Medicaid Services includes, in addition to the face-to-face time of a patient visit (documented in the note above) non-face-to-face time: obtaining and reviewing outside history, ordering and reviewing medications, tests or procedures, care coordination (communications with other health care professionals or caregivers) and documentation in the medical record.

## 2023-06-29 ENCOUNTER — Telehealth: Payer: Self-pay | Admitting: Hematology and Oncology

## 2023-06-29 NOTE — Telephone Encounter (Signed)
 Left patient a vm regarding upcoming appointment

## 2023-07-09 ENCOUNTER — Ambulatory Visit: Payer: 59 | Admitting: Dermatology

## 2023-07-09 DIAGNOSIS — L821 Other seborrheic keratosis: Secondary | ICD-10-CM | POA: Diagnosis not present

## 2023-07-09 DIAGNOSIS — W908XXA Exposure to other nonionizing radiation, initial encounter: Secondary | ICD-10-CM | POA: Diagnosis not present

## 2023-07-09 DIAGNOSIS — L814 Other melanin hyperpigmentation: Secondary | ICD-10-CM | POA: Diagnosis not present

## 2023-07-09 DIAGNOSIS — Z85828 Personal history of other malignant neoplasm of skin: Secondary | ICD-10-CM | POA: Diagnosis not present

## 2023-07-09 DIAGNOSIS — L820 Inflamed seborrheic keratosis: Secondary | ICD-10-CM

## 2023-07-09 DIAGNOSIS — L578 Other skin changes due to chronic exposure to nonionizing radiation: Secondary | ICD-10-CM

## 2023-07-09 DIAGNOSIS — D1801 Hemangioma of skin and subcutaneous tissue: Secondary | ICD-10-CM | POA: Diagnosis not present

## 2023-07-09 DIAGNOSIS — Z1283 Encounter for screening for malignant neoplasm of skin: Secondary | ICD-10-CM | POA: Diagnosis not present

## 2023-07-09 DIAGNOSIS — D229 Melanocytic nevi, unspecified: Secondary | ICD-10-CM

## 2023-07-09 NOTE — Patient Instructions (Addendum)

## 2023-07-09 NOTE — Progress Notes (Signed)
   Follow-Up Visit   Subjective  Jennifer Harrison is a 57 y.o. female who presents for the following: Skin Cancer Screening and Full Body Skin Exam  The patient presents for Total-Body Skin Exam (TBSE) for skin cancer screening and mole check. The patient has spots, moles and lesions to be evaluated, some may be new or changing and the patient may have concern these could be cancer.  Hx of BCC. Patient c/o flesh colored, raised spot at left lower abdomen. No pain, no itch, no bleeding.   The following portions of the chart were reviewed this encounter and updated as appropriate: medications, allergies, medical history  Review of Systems:  No other skin or systemic complaints except as noted in HPI or Assessment and Plan.  Objective  Well appearing patient in no apparent distress; mood and affect are within normal limits.  A full examination was performed including scalp, head, eyes, ears, nose, lips, neck, chest, axillae, abdomen, back, buttocks, bilateral upper extremities, bilateral lower extremities, hands, feet, fingers, toes, fingernails, and toenails. All findings within normal limits unless otherwise noted below.   Relevant physical exam findings are noted in the Assessment and Plan.  L anterior waistline x 1 Erythematous stuck-on, waxy papule or plaque    Assessment & Plan   SKIN CANCER SCREENING PERFORMED TODAY.  ACTINIC DAMAGE - Chronic condition, secondary to cumulative UV/sun exposure - diffuse scaly erythematous macules with underlying dyspigmentation - Recommend daily broad spectrum sunscreen SPF 30+ to sun-exposed areas, reapply every 2 hours as needed.  - Staying in the shade or wearing long sleeves, sun glasses (UVA+UVB protection) and wide brim hats (4-inch brim around the entire circumference of the hat) are also recommended for sun protection.  - Call for new or changing lesions.  LENTIGINES, SEBORRHEIC KERATOSES, HEMANGIOMAS - Benign normal skin  lesions - Benign-appearing - Call for any changes  MELANOCYTIC NEVI - Tan-brown and/or pink-flesh-colored symmetric macules and papules - Benign appearing on exam today - Observation - Call clinic for new or changing moles - Recommend daily use of broad spectrum spf 30+ sunscreen to sun-exposed areas.   HISTORY OF BASAL CELL CARCINOMA OF THE SKIN - No evidence of recurrence today - left ant nasal ala crease 2021, EDC - Recommend regular full body skin exams - Recommend daily broad spectrum sunscreen SPF 30+ to sun-exposed areas, reapply every 2 hours as needed.  - Call if any new or changing lesions are noted between office visits     Inflamed seborrheic keratosis L anterior waistline x 1  Symptomatic, irritating, patient would like treated.  Benign-appearing.  Call clinic for new or changing lesions.    Destruction of lesion - L anterior waistline x 1 Complexity: simple   Destruction method: cryotherapy   Informed consent: discussed and consent obtained   Timeout:  patient name, date of birth, surgical site, and procedure verified Lesion destroyed using liquid nitrogen: Yes   Region frozen until ice ball extended beyond lesion: Yes   Outcome: patient tolerated procedure well with no complications   Post-procedure details: wound care instructions given     Return in about 1 year (around 07/08/2024) for TBSE, with Dr. Kirtland Bouchard, Hx BCC.  Anise Salvo, RMA, am acting as scribe for Armida Sans, MD .   Documentation: I have reviewed the above documentation for accuracy and completeness, and I agree with the above.  Armida Sans, MD

## 2023-07-19 ENCOUNTER — Encounter: Payer: Self-pay | Admitting: Dermatology

## 2023-08-07 ENCOUNTER — Other Ambulatory Visit: Payer: Self-pay | Admitting: Hematology and Oncology

## 2023-08-07 NOTE — Telephone Encounter (Signed)
Per last OV note, continue anastrazole. Gardiner Rhyme, RN

## 2023-10-07 ENCOUNTER — Other Ambulatory Visit: Payer: Self-pay | Admitting: Hematology and Oncology

## 2023-11-04 ENCOUNTER — Encounter: Payer: Self-pay | Admitting: Hematology and Oncology

## 2023-11-05 ENCOUNTER — Other Ambulatory Visit: Payer: Self-pay | Admitting: *Deleted

## 2023-11-05 DIAGNOSIS — N6091 Unspecified benign mammary dysplasia of right breast: Secondary | ICD-10-CM

## 2023-11-05 DIAGNOSIS — C50412 Malignant neoplasm of upper-outer quadrant of left female breast: Secondary | ICD-10-CM

## 2023-11-05 DIAGNOSIS — Z9189 Other specified personal risk factors, not elsewhere classified: Secondary | ICD-10-CM

## 2023-11-06 ENCOUNTER — Ambulatory Visit
Admission: RE | Admit: 2023-11-06 | Discharge: 2023-11-06 | Disposition: A | Discharge status: Home / Self Care | Payer: 59 | Source: Ambulatory Visit | Attending: Hematology and Oncology | Admitting: Hematology and Oncology

## 2023-11-06 ENCOUNTER — Other Ambulatory Visit: Payer: Self-pay | Admitting: Hematology and Oncology

## 2023-11-06 DIAGNOSIS — C50412 Malignant neoplasm of upper-outer quadrant of left female breast: Secondary | ICD-10-CM

## 2023-11-06 DIAGNOSIS — Z853 Personal history of malignant neoplasm of breast: Secondary | ICD-10-CM | POA: Diagnosis not present

## 2023-12-25 ENCOUNTER — Telehealth: Payer: Self-pay

## 2023-12-25 NOTE — Telephone Encounter (Signed)
 Left message on voicemail about appointment on 4/21

## 2023-12-28 ENCOUNTER — Inpatient Hospital Stay: Attending: Hematology and Oncology | Admitting: Hematology and Oncology

## 2023-12-28 ENCOUNTER — Encounter: Payer: Self-pay | Admitting: Hematology and Oncology

## 2023-12-28 ENCOUNTER — Inpatient Hospital Stay: Payer: 59 | Admitting: Hematology and Oncology

## 2023-12-28 VITALS — BP 142/88 | HR 82 | Temp 98.3°F | Resp 17 | Wt 240.8 lb

## 2023-12-28 DIAGNOSIS — Z170 Estrogen receptor positive status [ER+]: Secondary | ICD-10-CM | POA: Diagnosis not present

## 2023-12-28 DIAGNOSIS — Z79811 Long term (current) use of aromatase inhibitors: Secondary | ICD-10-CM | POA: Diagnosis not present

## 2023-12-28 DIAGNOSIS — N6091 Unspecified benign mammary dysplasia of right breast: Secondary | ICD-10-CM

## 2023-12-28 DIAGNOSIS — C50412 Malignant neoplasm of upper-outer quadrant of left female breast: Secondary | ICD-10-CM | POA: Insufficient documentation

## 2023-12-28 NOTE — Progress Notes (Signed)
 [facility]  Telephone:(336) [phone] Fax:([phone]) [phone]    ID: Jennifer Harrison DOB: Mar 01, 1966  MR#: [id number]  JYN#:[id number]  Patient Care Team: Patient, [name] as PCP - General (General Practice) Enid Harry, MD as Consulting Physician (General Surgery) Colie Dawes, MD as Attending Physician (Radiation Oncology) Alane Hsu, RN as Oncology Nurse Navigator Auther Bo, RN as Oncology Nurse Navigator Alger Infield, MD as Consulting Physician (Plastic Surgery) Murleen Arms, MD as Consulting Physician (Hematology and Oncology)  CHIEF COMPLAINT: Estrogen receptor positive breast cancer (s/p left mastectomy)  CURRENT TREATMENT: Anastrozole   INTERVAL HISTORY:  Jennifer Harrison returns today for follow-up of her estrogen receptor positive breast cancer. She was started on anastrozole  on 04/09/2019.   Discussed the use of AI scribe software for clinical note transcription with the patient, who gave verbal consent to proceed.  History of Present Illness Jennifer Harrison is a 58 year old female with breast cancer who presents for follow-up regarding her breast cancer treatment and recent mammogram results.  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 She underwent a mastectomy and lumpectomy, with normal lymph nodes. She started anastrozole  in August 2020, with completion expected in August 2025. Pain in her armpit has resolved, attributed to physical therapy exercises. She then had ADH on right side s.p lumpectomy  A mammogram on November 06, 2023, showed stable disease. Her breast density is classified as scattered fibroglandular density, category B.  Her bone density was assessed in 2023, showing mild osteopenia with a T-score of -1.1. She takes vitamin D , B12, and magnesium supplements.  She has a history of blood clots in both lungs, which  led to the discontinuation of tamoxifen  and the initiation of anastrozole .  Her family history includes a strong predisposition to breast cancer, influencing her ongoing monitoring and treatment decisions.  No current pain in the armpit. Reports high blood pressure during doctor visits.    COVID 19 VACCINATION STATUS: unvaccinated; had COVID OCT 2020   HISTORY OF CURRENT ILLNESS: From the original intake note:  Jennifer Harrison underwent one-year follow up bilateral diagnostic mammography with tomography for benign right breast calcifications at [facility] on 10/04/2018 showing: Breast Density Category B. There are new calcifications in the upper outer and retroareolar left breast.. Magnification views confirm pleomorphic calcifications spanning approximately 10 cm anterior to posterior diameter, by 8 cm craniocaudal span by approximately 7 cm transverse diameter. While the majority of the calcifications are in the upper-outer quadrant, there are also suspicious calcifications in the central and medial retroareolar left breast, upper inner quadrant. No suspicious mass or distortion is identified in the left breast.   Accordingly on 10/08/2018 she proceeded to biopsies of the left breast areas in question. 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 Prognostic indicators significant for: estrogen receptor, 50% positive with moderate staining intensity and progesterone receptor, 50% positive with moderate-weak staining intensity. Proliferation marker Ki67 at <1%. HER2 negative (1+) by immunohistochemistry.   She underwent an additional left breast biopsy on the same day. The pathology from this retroareolar upper outer quadrant procedure showed ([id number]): ductal carcinoma in situ with calcifications, intermediate grade.  Prognostic panel was not obtained on this tissue.  Then,  she underwent an ultrasound of bilateral axillae on 10/14/2018 showing no abnormalities within either axilla. No abnormal lymph nodes  are identified.   Magnification views of the right breast show mammographically stable predominately punctate calcifications in the upper inner quadrant, a few of which demonstrate layering. There are additional scattered calcifications in the inner inner right breast. No suspicious mass or architectural distortion on the right.    Finally, she underwent a biopsy of the right breast area in question on 10/14/2018. The pathology from this procedure showed ([id number]): atypical ductal hyperplasia with calcifications, lobular neoplasia (atypical lobular hyperplasia), and fibrocystic changes with calcifications.   The patient's subsequent history is as detailed below.   PAST MEDICAL HISTORY: Past Medical History:  Diagnosis Date   Basal cell carcinoma 12/22/2019   left ant nasal ala crease - ED&C   Breast cancer (HCC)    Family history of breast cancer    Pulmonary embolus (HCC) 2020    PAST SURGICAL HISTORY: Past Surgical History:  Procedure Laterality Date   AUGMENTATION MAMMAPLASTY Left 04/2019   BREAST BIOPSY Right 11/22/2018   atypical , negative breast cancer   BREAST BIOPSY Right 10/12/2020   BREAST RECONSTRUCTION WITH PLACEMENT OF TISSUE EXPANDER AND ALLODERM Left 11/22/2018   Procedure: LEFT BREAST RECONSTRUCTION WITH TISSUE EXPANDER AND ACELLULAR DERMIS;  Surgeon: Alger Infield, MD;  Location: [facility];  Service: Plastics;  Laterality: Left;   BREAST REDUCTION SURGERY Right 04/26/2019   Procedure: RIGHT BREAST REDUCTION;  Surgeon: Alger Infield, MD;  Location: [facility];  Service: Plastics;  Laterality: Right;   Broken Wrist  1999   CESAREAN SECTION     endometrial biopsy  06/2014   LIPOSUCTION WITH LIPOFILLING Left 04/26/2019   Procedure: LIPOFILLING FROM ABDOMEN TO LEFT CHEST;  Surgeon: Alger Infield, MD;  Location: [facility];  Service: Plastics;  Laterality: Left;   MASTECTOMY Left 11/22/2018   total/complete with tissue expander and implant    MASTECTOMY W/ SENTINEL NODE BIOPSY Left 11/22/2018   Procedure: LEFT TOTAL MASTECTOMY WITH LEFT AXILLARY SENTINEL LYMPH NODE BIOPSY;  Surgeon: Enid Harry, MD;  Location: [facility];  Service: General;  Laterality: Left;   RADIOACTIVE SEED GUIDED EXCISIONAL BREAST BIOPSY Right 11/22/2018   Procedure: RIGHT BREAST RADIOACTIVE SEED GUIDED EXCISIONAL BIOPSY;  Surgeon: Enid Harry, MD;  Location: [facility];  Service: General;  Laterality: Right;   REDUCTION MAMMAPLASTY Right 04/26/2019   REMOVAL OF TISSUE EXPANDER AND PLACEMENT OF IMPLANT Left 04/26/2019   Procedure: REMOVAL OF LEFT TISSUE EXPANDER AND PLACEMENT OF IMPLANT;  Surgeon: Alger Infield, MD;  Location: [facility];  Service: Plastics;  Laterality: Left;    FAMILY HISTORY: Family History  Problem Relation Age of Onset   Breast cancer Mother 62       died 68; "negative genetic testing"   Diabetes Father    Hypertension Father    Hydrocephalus Brother    Breast cancer Maternal Grandmother 26       d. 31   Breast cancer Paternal Grandmother 32       d. 46   Breast cancer Paternal Aunt 57   Breast cancer Paternal Aunt 20       "genetic testing negative"   Other Maternal Grandfather        WWII   Lymphoma Paternal Grandfather   Hanako's father died from heart complications following a myocardial infarction at age 11. Patients' mother died from metastatic breast cancer at age 79. The patient has 1 brother and 1 sister. Jennifer Harrison's mother was diagnosed with breast cancer at age 31.  Jennifer Harrison's paternal grandmother, maternal grandmother, and two paternal aunts were diagnosed with breast cancer. Patient denies anyone in her family having ovarian, prostate, or pancreatic cancer. Jennifer Harrison's paternal grandfather was  diagnosed with lymphoma.    GYNECOLOGIC HISTORY:  Patient's last menstrual period was 07/14/2017. 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 She is divorced. She lives alone with her dog, a Environmental consultant mix (150 lbs). Her significant other, Jennifer Harrison, works for the [employer] at a water treatment facility. Ader has one child, Jennifer Harrison, who is 25, lives in [place], and graduated from [employer] with a degree in [employer]. 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  ADVANCED DIRECTIVES: Not in place. Jennifer Harrison was given the appropriate healthcare power of attorney form at the 10/20/2018 visit to fill out and return at her discretion   HEALTH MAINTENANCE: Social History   Tobacco Use   Smoking status: Former    Current packs/day: 0.00    Types: Cigarettes    Quit date: 09/08/1993    Years since quitting: 30.3   Smokeless tobacco: Never  Substance Use Topics   Alcohol use: Not Currently   Drug use: No    Colonoscopy: no  PAP: yes, 09/10/2018  Bone density: no   Allergies  Allergen Reactions   Sulfa Antibiotics Other (See Comments)    Pt unsure   Penicillins Rash    Did it involve swelling of the face/tongue/throat, SOB, or low BP? No Did it involve sudden or severe rash/hives, skin peeling, or any reaction on the inside of your mouth or nose? Yes Did you need to seek medical attention at a hospital or doctor's office? No When did it last happen?      unknown If all above answers are "NO", may proceed with cephalosporin use.     Current Outpatient Medications  Medication Sig Dispense Refill   anastrozole  (ARIMIDEX ) 1 MG tablet TAKE 1 TABLET BY MOUTH EVERY DAY 90 tablet 1   calcium carbonate (OSCAL) 1500 (600 Ca) MG  TABS tablet Take 1 tablet (1,500 mg total) by mouth 2 (two) times daily with a meal.  0   Multiple Vitamin (MULTIVITAMIN) tablet Take 1 tablet by mouth daily.       VITAMIN D  PO Take 1 capsule by mouth daily.      No current facility-administered medications for this visit.    OBJECTIVE: white woman in no acute distress  Vitals:   12/28/23 1313  BP: (!) 142/88  Pulse: 82  Resp: 17  Temp: 98.3 F (36.8 C)  SpO2: 98%       Body mass index is 42.66 kg/m.   Wt Readings from Last 3 Encounters:  12/28/23 240 lb 12.8 oz (109.2 kg)  05/29/23 235 lb 1.6 oz (106.6 kg)  11/25/22 234 lb 3.2 oz (106.2 kg)     ECOG FS:1 - Symptomatic but completely ambulatory      General appearance: Alert, oriented and in no acute distress   No palpable lymphadenopathy   S.p left mastectomy. Dense breast right lower outer quadrant. No palpable masses   No concern for recurrence.    LAB RESULTS:  CMP     Component Value Date/Time   NA 140 05/29/2023 1215   NA 141 08/06/2017 0927  K 4.0 05/29/2023 1215   CL 103 05/29/2023 1215   CO2 31 05/29/2023 1215   GLUCOSE 86 05/29/2023 1215   BUN 17 05/29/2023 1215   BUN 9 08/06/2017 0927   CREATININE 0.65 05/29/2023 1215   CREATININE 0.74 07/30/2016 1049   CALCIUM 10.0 05/29/2023 1215   PROT 7.8 05/29/2023 1215   PROT 7.1 08/06/2017 0927   ALBUMIN 4.9 05/29/2023 1215   ALBUMIN 4.3 08/06/2017 0927   AST 24 05/29/2023 1215   ALT 42 05/29/2023 1215   ALKPHOS 61 05/29/2023 1215   BILITOT 0.4 05/29/2023 1215   GFRNONAA >60 05/29/2023 1215   GFRAA >60 02/20/2020 1431   GFRAA >60 10/20/2018 0831    No results found for: "TOTALPROTELP", "ALBUMINELP", "A1GS", "A2GS", "BETS", "BETA2SER", "GAMS", "MSPIKE", "SPEI"  No results found for: "KPAFRELGTCHN", "LAMBDASER", "KAPLAMBRATIO"  Lab Results  Component Value Date   WBC 8.8 05/29/2023   NEUTROABS 6.4 05/29/2023   HGB 12.7 05/29/2023   HCT 37.3 05/29/2023   MCV 86.7 05/29/2023   PLT 314  05/29/2023   No results found for: "LABCA2"  No components found for: "GNFAOZ308"  No results for input(s): "INR" in the last 168 hours.  No results found for: "LABCA2"  No results found for: "MVH846"  No results found for: "CAN125"  No results found for: "CAN153"  No results found for: "CA2729"  No components found for: "HGQUANT"  No results found for: "CEA1", "CEA" / No results found for: "CEA1", "CEA"   No results found for: "AFPTUMOR"  No results found for: "CHROMOGRNA"  No results found for: "HGBA", "HGBA2QUANT", "HGBFQUANT", "HGBSQUAN" (Hemoglobinopathy evaluation)   No results found for: "LDH"  Lab Results  Component Value Date   IRON 65 07/30/2016   TIBC 439 07/30/2016   IRONPCTSAT 15 07/30/2016   (Iron and TIBC)  Lab Results  Component Value Date   FERRITIN 33 07/30/2016    Urinalysis    Component Value Date/Time   COLORURINE YELLOW 07/29/2013 1501   APPEARANCEUR CLEAR 07/29/2013 1501   LABSPEC 1.005 07/29/2013 1501   PHURINE 6.5 07/29/2013 1501   GLUCOSEU NEG 07/29/2013 1501   HGBUR NEG 07/29/2013 1501   BILIRUBINUR n 07/30/2016 0840   KETONESUR NEG 07/29/2013 1501   PROTEINUR n 07/30/2016 0840   PROTEINUR NEG 07/29/2013 1501   UROBILINOGEN negative 07/30/2016 0840   UROBILINOGEN 0.2 07/29/2013 1501   NITRITE n 07/30/2016 0840   NITRITE NEG 07/29/2013 1501   LEUKOCYTESUR Negative 07/30/2016 0840    STUDIES:  No results found.   ELIGIBLE FOR AVAILABLE RESEARCH PROTOCOL: no   ASSESSMENT: 58 y.o. Jennifer Harrison, [place] status post left breast upper outer quadrant biopsy 10/08/2018 for a clinical TX N0 invasive ductal carcinoma, grade 1, estrogen and progesterone receptor positive, HER-2 nonamplified, with an MIB-1 of less than 1%  (1) status post right breast upper inner quadrant biopsy 10/14/2018 showing atypical ductal hyperplasia and atypical lobular hyperplasia  (2) tamoxifen  started neoadjuvantly 10/20/2018, stopped 11/26/2018 (a week prior to  surgery) and not resumed postop due to development of DVT/PE  (3) status post left total mastectomy with sentinel lymph node sampling 11/22/2018 for a pT1a pN0, stage IA invasive ductal carcinoma, grade 1, with negative margins  (a) a total of 3 sentinel and 2 intramammary lymph nodes removed  (b) immediate left expander placement followed by saline implant placement 04/26/2019  (4) status post right lumpectomy 11/22/2018 showing atypical ductal hyperplasia  (5) multifocal acute segmental and subsegmental pulmonary embolism documented by CT angiogram 12/03/2018; no  hypotension, negative for right heart strain  (a) IV heparin  12/03/2018 through 12/06/2018  (b) started apixaban  12/06/2018, 10 mg BID x 7 d, then 5 mg BID  (c) D-dimer 03/15/2019 = less than 0.27  (d) stopped apixaban  04/08/2019  (e) repeat d-dimer October 2020 WNL  (6) Genetic testing through [facility] Common Hereditary Cancers Panel completed on 12/03/2017 showed no deleterious mutations in: APC, ATM, AXIN2, BARD1, BMPR1A, BRCA1, BRCA2, BRIP1, CDH1, CDK4, CDKN2A (p14ARF), CDKN2A (p16INK4a), CHEK2, CTNNA1, DICER1, EPCAM*, GREM1*, KIT, MEN1, MLH1, MSH2, MSH3, MSH6, MUTYH, NBN, NF1, PALB2, PDGFRA, PMS2, POLD1, POLE, PTEN, RAD50, RAD51C, RAD51D, SDHB, SDHC, SDHD, SMAD4, SMARCA4, STK11, TP53, TSC1, TSC2, VHL The following genes were evaluated for sequence changes only: HOXB13*, NTHL1*, SDHA.  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  (7) started anastrozole  04/09/2019  (a) repeated FSH measurements confirm postmenopausal status  (b) bone density 11/23/2019 shows a T score of 0.6   PLAN: Assessment & Plan Breast cancer, history of Early-stage breast cancer treated with mastectomy on left and ADH on right, s/p lumpectomy.  Anastrozole  therapy ongoing with expected completion in August 2025.  Recent mammogram negative for malignancy. Continued benefit from anastrozole  anticipated  post-treatment. - Order annual diagnostic mammogram at [facility], pt wants to transfer from GI to [facility]  Atypical lobular hyperplasia Biopsy confirmed atypical lobular hyperplasia with removal of concerning tissue. No malignancy on mammogram. - Monitor with annual diagnostic mammogram. Pt prefers diagnostic mammogram even beyond 5 yrs.  Osteopenia Mild osteopenia with bone density of -1.1. Vitamin D  supplementation ongoing. - Repeat bone density scan at the end of 2025 or early 2026. - Continue vitamin D  supplementation.  Hypertension Elevated blood pressure likely due to white coat syndrome. No changes in management.  General Health Maintenance Routine health maintenance discussed, including mammogram scheduling and bone density monitoring. - Schedule mammogram at [facility] for February 2026.  Time spent: 30 min  *Total Encounter Time as defined by the Centers for Medicare and Medicaid Services includes, in addition to the face-to-face time of a patient visit (documented in the note above) non-face-to-face time: obtaining and reviewing outside history, ordering and reviewing medications, tests or procedures, care coordination (communications with other health care professionals or caregivers) and documentation in the medical record.

## 2023-12-28 NOTE — Progress Notes (Signed)
 Jennifer [facility]  [phone]) [phone] Fax:(336) [phone]    ID: Jennifer Harrison DOB: [birth year]  MR#: [id number]  ZHY#:[id number]  Patient Care Team: Patient, [name] as PCP - General (General Practice) Jennifer Harry, MD as Consulting Physician (General Surgery) Jennifer Dawes, MD as Attending Physician (Radiation Oncology) Jennifer Hsu, RN as Oncology Nurse Navigator Jennifer Bo, RN as Oncology Nurse Navigator Jennifer Infield, MD as Consulting Physician (Plastic Surgery) Jennifer Arms, MD as Consulting Physician (Hematology and Oncology)  CHIEF COMPLAINT: Estrogen receptor positive breast cancer (s/p left mastectomy)  CURRENT TREATMENT: Anastrozole   INTERVAL HISTORY:  Jennifer Harrison returns today for follow-up of her estrogen receptor positive breast cancer. She was started on anastrozole  on 04/09/2019.  This was a follow up on her left axillary pain, she says it has been getting better compared to her last visit although it may not have completely resolved. She otherwise denies any health issues today   COVID 19 VACCINATION STATUS: unvaccinated; had COVID OCT 2020   HISTORY OF CURRENT ILLNESS: From Jennifer original intake note:  Jennifer Harrison underwent one-year follow up bilateral diagnostic mammography with tomography for benign right breast calcifications at [facility] on 10/04/2018 showing: Breast Density Category B. There are new calcifications in Jennifer upper outer and retroareolar left breast.. Magnification views confirm pleomorphic calcifications spanning approximately 10 cm anterior to posterior diameter, by 8 cm craniocaudal span by approximately 7 cm transverse diameter. While Jennifer majority of Jennifer calcifications are in Jennifer upper-outer quadrant, there are also suspicious calcifications in Jennifer central and medial retroareolar left breast, upper inner quadrant. No suspicious mass or distortion is identified in Jennifer left breast.   Accordingly  on 10/08/2018 she proceeded to biopsies of Jennifer left breast areas in question. Jennifer pathology from this procedure showed ([id number]): In Jennifer posterior upper outer quadrant area invasive ductal carcinoma, posterior upper outer, grade I. Prognostic indicators significant for: estrogen receptor, 50% positive with moderate staining intensity and progesterone receptor, 50% positive with moderate-weak staining intensity. Proliferation marker Ki67 at <1%. HER2 negative (1+) by immunohistochemistry.   She underwent an additional left breast biopsy on Jennifer same day. Jennifer pathology from this retroareolar upper outer quadrant procedure showed ([id number]): ductal carcinoma in situ with calcifications, intermediate grade.  Prognostic panel was not obtained on this tissue.  Then, she underwent an ultrasound of bilateral axillae on 10/14/2018 showing no abnormalities within either axilla. No abnormal lymph nodes are identified.   Magnification views of Jennifer right breast show mammographically stable predominately punctate calcifications in Jennifer upper inner quadrant, a few of which demonstrate layering. There are additional scattered calcifications in Jennifer inner inner right breast. No suspicious mass or architectural distortion on Jennifer right.    Finally, she underwent a biopsy of Jennifer right breast area in question on 10/14/2018. Jennifer pathology from this procedure showed ([id number]): atypical ductal hyperplasia with calcifications, lobular neoplasia (atypical lobular hyperplasia), and fibrocystic changes with calcifications.   Jennifer patient's subsequent history is as detailed below.   PAST MEDICAL HISTORY: Past Medical History:  Diagnosis Date   Basal cell carcinoma 12/22/2019   left ant nasal ala crease - ED&C   Breast cancer (HCC)    Family history of breast cancer    Pulmonary embolus (HCC) 2020    PAST SURGICAL HISTORY: Past Surgical History:  Procedure Laterality Date   AUGMENTATION MAMMAPLASTY Left 04/2019    BREAST BIOPSY Right 11/22/2018   atypical , negative breast cancer   BREAST BIOPSY Right 10/12/2020  BREAST RECONSTRUCTION WITH PLACEMENT OF TISSUE EXPANDER AND ALLODERM Left 11/22/2018   Procedure: LEFT BREAST RECONSTRUCTION WITH TISSUE EXPANDER AND ACELLULAR DERMIS;  Surgeon: Jennifer Infield, MD;  Location: [facility];  Service: Plastics;  Laterality: Left;   BREAST REDUCTION SURGERY Right 04/26/2019   Procedure: RIGHT BREAST REDUCTION;  Surgeon: Jennifer Infield, MD;  Location: [facility];  Service: Plastics;  Laterality: Right;   Broken Wrist  1999   CESAREAN SECTION     endometrial biopsy  06/2014   LIPOSUCTION WITH LIPOFILLING Left 04/26/2019   Procedure: LIPOFILLING FROM ABDOMEN TO LEFT CHEST;  Surgeon: Jennifer Infield, MD;  Location: [facility];  Service: Plastics;  Laterality: Left;   MASTECTOMY Left 11/22/2018   total/complete with tissue expander and implant    MASTECTOMY W/ SENTINEL NODE BIOPSY Left 11/22/2018   Procedure: LEFT TOTAL MASTECTOMY WITH LEFT AXILLARY SENTINEL LYMPH NODE BIOPSY;  Surgeon: Jennifer Harry, MD;  Location: [facility];  Service: General;  Laterality: Left;   RADIOACTIVE SEED GUIDED EXCISIONAL BREAST BIOPSY Right 11/22/2018   Procedure: RIGHT BREAST RADIOACTIVE SEED GUIDED EXCISIONAL BIOPSY;  Surgeon: Jennifer Harry, MD;  Location: [facility];  Service: General;  Laterality: Right;   REDUCTION MAMMAPLASTY Right 04/26/2019   REMOVAL OF TISSUE EXPANDER AND PLACEMENT OF IMPLANT Left 04/26/2019   Procedure: REMOVAL OF LEFT TISSUE EXPANDER AND PLACEMENT OF IMPLANT;  Surgeon: Jennifer Infield, MD;  Location: [facility];  Service: Plastics;  Laterality: Left;    FAMILY HISTORY: Family History  Problem Relation Age of Onset   Breast cancer Mother 65       died 69; "negative genetic testing"   Diabetes Father    Hypertension Father    Hydrocephalus Brother     Breast cancer Maternal Grandmother 64       d. 32   Breast cancer Paternal Grandmother 73       d. 58   Breast cancer Paternal Aunt 40   Breast cancer Paternal Aunt 69       "genetic testing negative"   Other Maternal Grandfather        WWII   Lymphoma Paternal Grandfather   Ryah's father died from heart complications following a myocardial infarction at age 44. Patients' mother died from metastatic breast cancer at age 59. Jennifer patient has 1 brother and 1 sister. Preethi's mother was diagnosed with breast cancer at age 64. Teffany's paternal grandmother, maternal grandmother, and two paternal aunts were diagnosed with breast cancer. Patient denies anyone in her family having ovarian, prostate, or pancreatic cancer. Eleasha's paternal grandfather was diagnosed with lymphoma.    GYNECOLOGIC HISTORY:  Patient's last menstrual period was 07/14/2017. 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 She is divorced. She lives alone with her dog, a Environmental consultant mix (150 lbs). Her significant other, Shannon Darter, works for Jennifer [employer] at a water treatment facility. Avionna has one child, Carrol Clam, who is 54, lives in [place], and graduated from [employer] [facility] [employer] with a degree in [employer]. Carrol Clam is currently working a Airline pilot.  ADVANCED DIRECTIVES: Not in place. Teralyn was given Jennifer appropriate healthcare power of attorney form at Jennifer 10/20/2018 visit to fill out and return at her discretion  HEALTH MAINTENANCE: Social History   Tobacco Use   Smoking status: Former    Current packs/day: 0.00    Types: Cigarettes    Quit date: 09/08/1993    Years since quitting: 30.3   Smokeless tobacco: Never  Substance Use Topics   Alcohol use: Not  Currently   Drug use: No    Colonoscopy: no  PAP: yes, 09/10/2018  Bone density: no   Allergies  Allergen Reactions   Sulfa Antibiotics Other (See Comments)    Pt unsure   Penicillins Rash    Did it involve swelling of Jennifer face/tongue/throat, SOB, or low BP? No Did it involve sudden or severe rash/hives, skin peeling, or any reaction on Jennifer inside of your mouth or nose? Yes Did you need to seek medical attention at a hospital or doctor's office? No When did it last happen?      unknown If all above answers are "NO", may proceed with cephalosporin use.     Current Outpatient Medications  Medication Sig Dispense Refill   anastrozole  (ARIMIDEX ) 1 MG tablet TAKE 1 TABLET BY MOUTH EVERY DAY 90 tablet 1   calcium carbonate (OSCAL) 1500 (600 Ca) MG TABS tablet Take 1 tablet (1,500 mg total) by mouth 2 (two) times daily with a meal.  0   Multiple Vitamin (MULTIVITAMIN) tablet Take 1 tablet by mouth daily.       VITAMIN D  PO Take 1 capsule by mouth daily.      No current facility-administered medications for this visit.    OBJECTIVE: white woman in no acute distress  Vitals:   12/28/23 1313  BP: (!) 142/88  Pulse: 82  Resp: 17  Temp: 98.3 F (36.8 C)  SpO2: 98%       Body mass index is 42.66 kg/m.   Wt Readings from Last 3 Encounters:  12/28/23 240 lb 12.8 oz (109.2 kg)  05/29/23 235 lb 1.6 oz (106.6 kg)  11/25/22 234 lb 3.2 oz (106.2 kg)     ECOG FS:1 - Symptomatic but completely ambulatory  PE not done, telephone visit    LAB RESULTS:  CMP     Component Value Date/Time   NA 140 05/29/2023 1215   NA 141 08/06/2017 0927   K 4.0 05/29/2023 1215   CL 103 05/29/2023 1215   CO2 31 05/29/2023 1215   GLUCOSE 86 05/29/2023 1215   BUN 17 05/29/2023 1215   BUN 9 08/06/2017 0927   CREATININE 0.65 05/29/2023 1215   CREATININE 0.74 07/30/2016 1049   CALCIUM 10.0 05/29/2023 1215   PROT 7.8 05/29/2023 1215   PROT 7.1 08/06/2017 0927   ALBUMIN 4.9 05/29/2023 1215    ALBUMIN 4.3 08/06/2017 0927   AST 24 05/29/2023 1215   ALT 42 05/29/2023 1215   ALKPHOS 61 05/29/2023 1215   BILITOT 0.4 05/29/2023 1215   GFRNONAA >60 05/29/2023 1215   GFRAA >60 02/20/2020 1431   GFRAA >60 10/20/2018 0831    No results found for: "TOTALPROTELP", "ALBUMINELP", "A1GS", "A2GS", "BETS", "BETA2SER", "GAMS", "MSPIKE", "SPEI"  No results found for: "KPAFRELGTCHN", "LAMBDASER", "KAPLAMBRATIO"  Lab Results  Component Value Date   WBC 8.8 05/29/2023   NEUTROABS 6.4 05/29/2023   HGB 12.7 05/29/2023   HCT 37.3 05/29/2023   MCV 86.7 05/29/2023   PLT 314 05/29/2023   No results found for: "LABCA2"  No components found for: "ZOXWRU045"  No results for input(s): "INR" in Jennifer last 168 hours.  No results found for: "LABCA2"  No results found for: "  ZOX096"  No results found for: "CAN125"  No results found for: "CAN153"  No results found for: "CA2729"  No components found for: "HGQUANT"  No results found for: "CEA1", "CEA" / No results found for: "CEA1", "CEA"   No results found for: "AFPTUMOR"  No results found for: "CHROMOGRNA"  No results found for: "HGBA", "HGBA2QUANT", "HGBFQUANT", "HGBSQUAN" (Hemoglobinopathy evaluation)   No results found for: "LDH"  Lab Results  Component Value Date   IRON 65 07/30/2016   TIBC 439 07/30/2016   IRONPCTSAT 15 07/30/2016   (Iron and TIBC)  Lab Results  Component Value Date   FERRITIN 33 07/30/2016    Urinalysis    Component Value Date/Time   COLORURINE YELLOW 07/29/2013 1501   APPEARANCEUR CLEAR 07/29/2013 1501   LABSPEC 1.005 07/29/2013 1501   PHURINE 6.5 07/29/2013 1501   GLUCOSEU NEG 07/29/2013 1501   HGBUR NEG 07/29/2013 1501   BILIRUBINUR n 07/30/2016 0840   KETONESUR NEG 07/29/2013 1501   PROTEINUR n 07/30/2016 0840   PROTEINUR NEG 07/29/2013 1501   UROBILINOGEN negative 07/30/2016 0840   UROBILINOGEN 0.2 07/29/2013 1501   NITRITE n 07/30/2016 0840   NITRITE NEG 07/29/2013 1501   LEUKOCYTESUR  Negative 07/30/2016 0840    STUDIES:  No results found.   ELIGIBLE FOR AVAILABLE RESEARCH PROTOCOL: no   ASSESSMENT: 58 y.o. [place], [place] status post left breast upper outer quadrant biopsy 10/08/2018 for a clinical TX N0 invasive ductal carcinoma, grade 1, estrogen and progesterone receptor positive, HER-2 nonamplified, with an MIB-1 of less than 1%  (1) status post right breast upper inner quadrant biopsy 10/14/2018 showing atypical ductal hyperplasia and atypical lobular hyperplasia  (2) tamoxifen  started neoadjuvantly 10/20/2018, stopped 11/26/2018 (a week prior to surgery) and not resumed postop due to development of DVT/PE  (3) status post left total mastectomy with sentinel lymph node sampling 11/22/2018 for a pT1a pN0, stage IA invasive ductal carcinoma, grade 1, with negative margins  (a) a total of 3 sentinel and 2 intramammary lymph nodes removed  (b) immediate left expander placement followed by saline implant placement 04/26/2019  (4) status post right lumpectomy 11/22/2018 showing atypical ductal hyperplasia  (5) multifocal acute segmental and subsegmental pulmonary embolism documented by CT angiogram 12/03/2018; no hypotension, negative for right heart strain  (a) IV heparin  12/03/2018 through 12/06/2018  (b) started apixaban  12/06/2018, 10 mg BID x 7 d, then 5 mg BID  (c) D-dimer 03/15/2019 = less than 0.27  (d) stopped apixaban  04/08/2019  (e) repeat d-dimer October 2020 WNL  (6) Genetic testing through [facility] Common Hereditary Cancers Panel completed on 12/03/2017 showed no deleterious mutations in: APC, ATM, AXIN2, BARD1, BMPR1A, BRCA1, BRCA2, BRIP1, CDH1, CDK4, CDKN2A (p14ARF), CDKN2A (p16INK4a), CHEK2, CTNNA1, DICER1, EPCAM*, GREM1*, KIT, MEN1, MLH1, MSH2, MSH3, MSH6, MUTYH, NBN, NF1, PALB2, PDGFRA, PMS2, POLD1, POLE, PTEN, RAD50, RAD51C, RAD51D, SDHB, SDHC, SDHD, SMAD4, SMARCA4, STK11, TP53, TSC1, TSC2, VHL Jennifer following genes were evaluated for sequence changes only:  HOXB13*, NTHL1*, SDHA.  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 Recommend PT exercises previously demonstrated to help with stretching.  Breast Cancer Surveillance Patient is on Anastrozole  since August 2020 following radiation therapy for breast cancer.  Left axillary pain has improved significantly,  She is  agreeable to continue with mammogram planned for Feb 2025. She was however strongly encouraged to call me with any worsening pain or new concerns. She expressed understanding.  RTC in 6 months per pt preference Labs today. Total encounter time 5 min  I connected with  Audriana Simmons Falzon on 12/28/23 by a telephone application and verified that I am speaking with Jennifer correct person using two identifiers.   I discussed Jennifer limitations of evaluation and management by telemedicine. Jennifer patient expressed understanding and agreed to proceed.   *Total Encounter Time as defined by Jennifer [facility] Medicare and Medicaid Services includes, in addition to Jennifer face-to-face time of a patient visit (documented in Jennifer note above) non-face-to-face time: obtaining and reviewing outside history, ordering and reviewing medications, tests or procedures, care coordination (communications with other health care professionals or caregivers) and documentation in Jennifer medical record.

## 2024-01-29 ENCOUNTER — Encounter: Payer: Self-pay | Admitting: General Practice

## 2024-02-19 ENCOUNTER — Ambulatory Visit: Admitting: Pediatrics

## 2024-02-25 ENCOUNTER — Ambulatory Visit: Admitting: Pediatrics

## 2024-02-25 ENCOUNTER — Encounter: Payer: Self-pay | Admitting: Pediatrics

## 2024-02-25 VITALS — BP 135/88 | HR 80 | Ht 63.0 in | Wt 243.0 lb

## 2024-02-25 DIAGNOSIS — C50412 Malignant neoplasm of upper-outer quadrant of left female breast: Secondary | ICD-10-CM

## 2024-02-25 DIAGNOSIS — Z7689 Persons encountering health services in other specified circumstances: Secondary | ICD-10-CM | POA: Diagnosis not present

## 2024-02-25 DIAGNOSIS — R030 Elevated blood-pressure reading, without diagnosis of hypertension: Secondary | ICD-10-CM | POA: Diagnosis not present

## 2024-02-25 DIAGNOSIS — Z170 Estrogen receptor positive status [ER+]: Secondary | ICD-10-CM | POA: Diagnosis not present

## 2024-02-25 DIAGNOSIS — I10 Essential (primary) hypertension: Secondary | ICD-10-CM | POA: Insufficient documentation

## 2024-02-25 DIAGNOSIS — Z1330 Encounter for screening examination for mental health and behavioral disorders, unspecified: Secondary | ICD-10-CM

## 2024-02-25 NOTE — Progress Notes (Unsigned)
 Establish Care Note  BP 135/88   Pulse 80   Ht 5' 3 (1.6 m)   Wt 243 lb (110.2 kg)   LMP 07/14/2017 Comment: was on BCP until 09-2018  BMI 43.05 kg/m    Subjective:    Patient ID: Jennifer [facility], female    DOB: Dec 15, 1965, 58 y.o.   MRN: [id number]  HPI: Jennifer [facility] is a 58 y.o. female  Chief Complaint  Patient presents with  . Establish Care    Establishing care, the following was discussed today:  Discussed the use of AI scribe software for clinical note transcription with the patient, who gave verbal consent to proceed.  History of Present Illness     Current Outpatient Medications on File Prior to Visit  Medication Sig Dispense Refill  . anastrozole  (ARIMIDEX ) 1 MG tablet TAKE 1 TABLET BY MOUTH EVERY DAY 90 tablet 1  . calcium carbonate (OSCAL) 1500 (600 Ca) MG TABS tablet Take 1 tablet (1,500 mg total) by mouth 2 (two) times daily with a meal.  0  . MAGNESIUM PO Take by mouth.    . Multiple Vitamin (MULTIVITAMIN) tablet Take 1 tablet by mouth daily.      . VITAMIN D  PO Take 1 capsule by mouth daily.      No current facility-administered medications on file prior to visit.    #HM Will review HM records and updated as needed.  Relevant past medical, surgical, family and social history reviewed and updated as indicated. Interim medical history since our last visit reviewed. Allergies and medications reviewed and updated.  ROS per HPI unless specifically indicated above     Objective:    BP 135/88   Pulse 80   Ht 5' 3 (1.6 m)   Wt 243 lb (110.2 kg)   LMP 07/14/2017 Comment: was on BCP until 09-2018  BMI 43.05 kg/m   Wt Readings from Last 3 Encounters:  02/25/24 243 lb (110.2 kg)  12/28/23 240 lb 12.8 oz (109.2 kg)  05/29/23 235 lb 1.6 oz (106.6 kg)     Physical Exam      02/25/2024    3:19 PM  Depression screen PHQ 2/9  Decreased Interest 0  Down, Depressed, Hopeless 0  PHQ - 2 Score 0  Altered sleeping 1  Tired,  decreased energy 0  Change in appetite 0  Feeling bad or failure about yourself  0  Trouble concentrating 0  Moving slowly or fidgety/restless 0  Suicidal thoughts 0  PHQ-9 Score 1  Difficult doing work/chores Not difficult at all        02/25/2024    3:19 PM  GAD 7 : Generalized Anxiety Score  Nervous, Anxious, on Edge 1  Control/stop worrying 0  Worry too much - different things 0  Trouble relaxing 0  Restless 0  Easily annoyed or irritable 2  Afraid - awful might happen 0  Total GAD 7 Score 3  Anxiety Difficulty Somewhat difficult       Assessment & Plan:  Assessment & Plan   Elevated blood pressure reading  BMI 43 -     Comprehensive metabolic panel with GFR -     Hemoglobin A1c -     Lipid panel -     CBC -     TSH  Encounter to establish care  Encounter for behavioral health screening    Assessment and Plan Assessment & Plan        Follow up plan: Return in about 4  weeks (around 03/24/2024) for Physical.  Hadassah SHAUNNA Nett, MD

## 2024-02-25 NOTE — Patient Instructions (Addendum)
 Weight medications:  Injectable medications: GLP1 agonist like ozempic, wegovy, zepbound, mounjaro  Once weekly injections Most common side effects: nausea, diarrhea, abdominal pain, constipation, acid reflux Higher risk for pancreatitis (inflammation of pancreas) - if developed this while on the medication would stop it Contraindications: history of medullary thyroid cancer, MEN2 disorders, pregnancy Depending on which injection, can see between 14-26% body weight loss  Oral medications - can do single pill or combined as below Welbutrin/naltrexone SE: headaches, insomnia, increased anxiety 8-10% weight loss Phentermine/topiramate SE: headaches, insomnia, increased anxiety 8-10% weight loss With topiramate: nausea is most common Orlistat SE: greasy stools, abdominal pain 2% weight loss   Good to meet you! Welcome to [facility]!  As your primary care doctor, I look forward to working with you to help you reach your health goals.  Please be aware of a couple of logistical items: - If you message me on mychart, it may take me 1-2 business days to get back to you. This is for non-urgent messaging.  - If you require urgent clinical attention, please call the clinic or present to urgent care/emergency room - If you have labs, I typically will send a message about them in 1-2 business days. - I am not here on Mondays, otherwise will be available from Tuesday-Friday during 8a-5pm.

## 2024-02-26 ENCOUNTER — Ambulatory Visit: Payer: Self-pay | Admitting: Pediatrics

## 2024-02-26 LAB — COMPREHENSIVE METABOLIC PANEL WITH GFR
ALT: 45 IU/L — ABNORMAL HIGH (ref 0–32)
AST: 27 IU/L (ref 0–40)
Albumin: 4.9 g/dL (ref 3.8–4.9)
Alkaline Phosphatase: 74 IU/L (ref 44–121)
BUN/Creatinine Ratio: 19 (ref 9–23)
BUN: 12 mg/dL (ref 6–24)
Bilirubin Total: 0.2 mg/dL (ref 0.0–1.2)
CO2: 25 mmol/L (ref 20–29)
Calcium: 10 mg/dL (ref 8.7–10.2)
Chloride: 99 mmol/L (ref 96–106)
Creatinine, Ser: 0.63 mg/dL (ref 0.57–1.00)
Globulin, Total: 2.3 g/dL (ref 1.5–4.5)
Glucose: 78 mg/dL (ref 70–99)
Potassium: 4.3 mmol/L (ref 3.5–5.2)
Sodium: 141 mmol/L (ref 134–144)
Total Protein: 7.2 g/dL (ref 6.0–8.5)
eGFR: 104 mL/min/{1.73_m2} (ref >59)

## 2024-02-26 LAB — CBC
Hematocrit: 40 % (ref 34.0–46.6)
Hemoglobin: 13.1 g/dL (ref 11.1–15.9)
MCH: 30 pg (ref 26.6–33.0)
MCHC: 32.8 g/dL (ref 31.5–35.7)
MCV: 92 fL (ref 79–97)
Platelets: 307 10*3/uL (ref 150–450)
RBC: 4.36 x10E6/uL (ref 3.77–5.28)
RDW: 12.6 % (ref 11.7–15.4)
WBC: 10.6 10*3/uL (ref 3.4–10.8)

## 2024-02-26 LAB — HEMOGLOBIN A1C
Est. average glucose Bld gHb Est-mCnc: 120 mg/dL
Hgb A1c MFr Bld: 5.8 % — ABNORMAL HIGH (ref 4.8–5.6)

## 2024-02-26 LAB — LIPID PANEL
Chol/HDL Ratio: 4.3 ratio (ref 0.0–4.4)
Cholesterol, Total: 212 mg/dL — ABNORMAL HIGH (ref 100–199)
HDL: 49 mg/dL (ref >39)
LDL Chol Calc (NIH): 145 mg/dL — ABNORMAL HIGH (ref 0–99)
Triglycerides: 100 mg/dL (ref 0–149)
VLDL Cholesterol Cal: 18 mg/dL (ref 5–40)

## 2024-02-26 LAB — TSH: TSH: 1.11 u[IU]/mL (ref 0.450–4.500)

## 2024-03-03 ENCOUNTER — Encounter: Payer: Self-pay | Admitting: Pediatrics

## 2024-03-03 NOTE — Assessment & Plan Note (Signed)
 Reached five-year mark post-mastectomy and reconstruction. Recovered from post-surgery pulmonary embolism. - Continue annual oncology checkups.

## 2024-03-03 NOTE — Assessment & Plan Note (Signed)
 Discussed weight management options, including GLP-1 agonists, which can lead to reduce cardiovascular risk. Explained potential gastrointestinal side effects and gradual dose escalation. Emphasized overall health focus.   - Order blood work to assess hormonal and metabolic factors. - Discussed weight management options, including GLP-1 agonists. Will let me know at follow up if wants to start one - Consider referral to a nutritionist.

## 2024-03-04 ENCOUNTER — Ambulatory Visit: Admitting: Pediatrics

## 2024-03-04 ENCOUNTER — Ambulatory Visit (INDEPENDENT_AMBULATORY_CARE_PROVIDER_SITE_OTHER): Admitting: Pediatrics

## 2024-03-04 VITALS — BP 120/77 | HR 92 | Temp 98.5°F | Wt 244.0 lb

## 2024-03-04 DIAGNOSIS — Z1330 Encounter for screening examination for mental health and behavioral disorders, unspecified: Secondary | ICD-10-CM

## 2024-03-04 DIAGNOSIS — Z0000 Encounter for general adult medical examination without abnormal findings: Secondary | ICD-10-CM | POA: Diagnosis not present

## 2024-03-04 NOTE — Progress Notes (Unsigned)
 BP 120/77   Pulse 92   Temp 98.5 F (36.9 C) (Oral)   Wt 244 lb (110.7 kg)   LMP 07/14/2017 Comment: was on BCP until 09-2018  SpO2 99%   BMI 43.22 kg/m    Annual Physical Exam - Female  Subjective:   CC: Annual Exam   Jennifer Harrison is a 58 y.o. female patient here for a preventative health maintenance exam{Blank Single :19197:: and has no acute complaints.,. Additional topics discussed include:}  Health Habits: DIET: {Desc; diets:16563} EXERCISE: *** times/week on average, activities include {misc; exercise types:16438} DENTAL EXAM: {UTDSTATUS:31041} EYE EXAM: {UTDSTATUS:31041}                       Relevant Gynecologic History LMP: Patient's last menstrual period was 07/14/2017.  Menstrual Status: {Menopause:31378}, Flow {Misc; menses description:16152} PAP History:  Result Date Procedure Results Follow-ups  09/16/2019 Cytology - PAP( [facility]) Adequacy: Satisfactory for evaluation; transformation zone component PRESENT. Diagnosis: - Negative for intraepithelial lesion or malignancy (NILM)   09/10/2018 Cytology - PAP( [facility]) Adequacy: Satisfactory for evaluation  endocervical/transformation zone component PRESENT. Diagnosis: NEGATIVE FOR INTRAEPITHELIAL LESIONS OR MALIGNANCY. HPV: NOT DETECTED Material Submitted: CervicoVaginal Pap [ThinPrep Imaged]   07/30/2016 PAP with Reflex to HPV (IPS) COMMENTS:: [facility]   06/14/2014 Pap Test with HP (IPS) COMMENTS:: [facility]   07/03/2011 Cytology - PAP      History abnormal PAP: {yes/no:20286}  Sexual activity: {sexual partners:315163} Family history breast, ovarian cancer: {yes/no:20286} Domestic Violence Screen, feels safe at home: {yes/no:20286}  family history includes Breast cancer (age of onset: 52) in her mother; Breast cancer (age of onset: 28) in her maternal grandmother; Breast cancer (age of onset: 50) in her paternal grandmother; Breast cancer (age of  onset: 61) in her paternal aunt and paternal aunt; Cancer in her mother; Diabetes in her father; Hydrocephalus in her brother; Hypertension in her father; Lymphoma in her paternal grandfather; Other in her maternal grandfather.  Social History   Tobacco Use  . Smoking status: Former    Current packs/day: 0.00    Types: Cigarettes    Quit date: 09/08/1993    Years since quitting: 30.5  . Smokeless tobacco: Never  Substance Use Topics  . Alcohol use: Not Currently  . Drug use: No   Social History   Social History Narrative  . Not on file    Social drivers questionnaire is reviewed and is positive for : {SDOH Challenges:24934}. Follow up: {sdoh follow up:67204::None}  Depression Screening:     03/04/2024    2:45 PM 02/25/2024    3:19 PM  Depression screen PHQ 2/9  Decreased Interest 0 0  Down, Depressed, Hopeless 0 0  PHQ - 2 Score 0 0  Altered sleeping 1 1  Tired, decreased energy 1 0  Change in appetite 0 0  Feeling bad or failure about yourself  0 0  Trouble concentrating 0 0  Moving slowly or fidgety/restless 0 0  Suicidal thoughts 0 0  PHQ-9 Score 2 1  Difficult doing work/chores Somewhat difficult Not difficult at all       03/04/2024    2:45 PM 02/25/2024    3:19 PM  GAD 7 : Generalized Anxiety Score  Nervous, Anxious, on Edge 0 1  Control/stop worrying 0 0  Worry too much - different things 0 0  Trouble relaxing 0 0  Restless 1 0  Easily annoyed or irritable 1 2  Afraid - awful  might happen 0 0  Total GAD 7 Score 2 3  Anxiety Difficulty Somewhat difficult Somewhat difficult    Mental Health Plan: {[phone]}  Self Management Goals  Goals   None     Health Maintenance Colon Cancer Screening : {UTDSTATUS:31041} Mammogram : {UTDSTATUS:31041} DXA scan : {UTDSTATUS:31041} Immunizations : {Immunizations:5306}  Review of Systems See HPI for relevant ROS.  Outpatient Medications Prior to Visit  Medication Sig Dispense Refill  . anastrozole   (ARIMIDEX ) 1 MG tablet TAKE 1 TABLET BY MOUTH EVERY DAY 90 tablet 1  . cyanocobalamin (VITAMIN B12) 1000 MCG tablet Take 1,000 mcg by mouth daily.    SABRA MAGNESIUM PO Take by mouth.    . Multiple Vitamin (MULTIVITAMIN) tablet Take 1 tablet by mouth daily.      . VITAMIN D  PO Take 1 capsule by mouth daily.     . calcium carbonate (OSCAL) 1500 (600 Ca) MG TABS tablet Take 1 tablet (1,500 mg total) by mouth 2 (two) times daily with a meal. (Patient not taking: Reported on 03/04/2024)  0   No facility-administered medications prior to visit.     Patient Active Problem List   Diagnosis Date Noted  . BMI 43 02/25/2024  . History of pulmonary embolism 12/03/2018  . Acquired absence of left breast 11/29/2018  . Malignant neoplasm of upper-outer quadrant of left breast in female, estrogen receptor positive (HCC) 10/14/2018  . Family history of breast cancer     Objective:   Vitals:   03/04/24 1434  BP: 120/77  Pulse: 92  Temp: 98.5 F (36.9 C)  Weight: 244 lb (110.7 kg)  SpO2: 99%  TempSrc: Oral  BMI (Calculated): 43.23    Body mass index is 43.22 kg/m.  Physical Exam ***   Assessment and Plan:   There are no diagnoses linked to this encounter.   This plan was discussed with the patient and questions were answered. There were no further concerns.  Follow up as indicated, or sooner should any new problems arise, if conditions worsen, or if they are otherwise concerned.   See patient instructions for additional information.  Hadassah SHAUNNA Nett, MD  Family Medicine      Future Appointments  Date Time Provider Department Center  07/11/2024  3:15 PM Hester Alm BROCKS, MD [facility] None

## 2024-03-04 NOTE — Patient Instructions (Signed)
 [facility] Specialty Address [facility] Phone Number Practice Name  [place] [facility] [address] [place] [phone] Alric Quan CRISP DDS MS PA  [place] [facility] [address] [place] [phone] Enid Derry DDS PA III  [place] [facility] Dentist [address] [place] [phone] [facility]  [place] [facility] Dentist [address] [place] [phone] [facility]  [place] [facility] Dentist [address] [place] [phone] Sheral Apley IV DMD PA  [place] [facility] Dentist [address] [place] [phone] [facility] FAMILY DENTISTRY  [place] General Dentist [address] [place] [phone] [facility] General Dentist [address] [place] [phone] Yevette Edwards  [place] [facility] Dentist [address] [place] [phone] [facility] [place]OVT  [place] [address] [place] [phone] [facility] Oral Surgeon [address] Louellen Molder [phone] Charna Archer DMD MS [facility]  [place] Pediatric Dentist [address] [place] [phone] [facility] PEDIATRIC DENTISTRY

## 2024-03-08 ENCOUNTER — Other Ambulatory Visit: Payer: Self-pay | Admitting: Pediatrics

## 2024-03-08 ENCOUNTER — Encounter: Payer: Self-pay | Admitting: Pediatrics

## 2024-03-08 DIAGNOSIS — R7303 Prediabetes: Secondary | ICD-10-CM | POA: Insufficient documentation

## 2024-03-08 MED ORDER — SEMAGLUTIDE-WEIGHT MANAGEMENT 0.25 MG/0.5ML ~~LOC~~ SOAJ: 0.2500 mg | SUBCUTANEOUS | 0 refills | Status: DC

## 2024-03-08 NOTE — Progress Notes (Signed)
 Patient inquiring about insurance coverage of GLP1. She is active and has implemented lifestyle modifications for the last year. She does qualify for glp1 by BMI. Will submit test claim  Clinical coverage for weight loss GLP's  Medication being dispensed is Wegovy 3 mL/28 day. Titration doses are 2 mL/28 days.  [x]  Product being prescribed is FDA approved for the indication, age, weight (if applicable) and not does not exceed dosing limits per the Prescribing Information per the clinical conditions for use.  [x]  Patient's baseline weight measured within the last 45 days as required by provider before dispensing.  [x]  Patient is new to therapy and One of the following:   [x]  The beneficiary is 56 years of age or over and has ONE of the following:  [x]  A BMI greater than or equal to 30 kg/m2  []  A BMI greater than or equal to 27 kg/m2 with at least one weight-related comorbidity/risk factor/complication (i.e. hypertension, type 2 diabetes, obstructive sleep apnea, cardiovascular disease, dyslipidemia)  If patient has one weight-related comorbidity/risk factor/complication (i.e. hypertension, type 2 diabetes, obstructive sleep apnea, cardiovascular disease, dyslipidemia), please list:prediabetes  [x]  The beneficiary is 63 years of age or older with a BMI greater than or equal to 27 kg/m2 AND has established cardiovascular disease (CVD) defined as having a history of myocardial infarction, stroke, or symptomatic peripheral disease, to be documented on the PA form. AND  [x]  The beneficiary is currently on and will continue lifestyle modification including structured nutrition and physical activity, unless physical activity is not clinically appropriate at the time GLP1 therapy commences AND  [x]  The beneficiary will NOT be using the requested agent in combination with another GLP-1 receptor agonist agent AND  [x]  The beneficiary does NOT have any FDA-labeled contraindications to the requested  agent, including pregnancy, lactation, history of medullary thyroid cancer or multiple endocrine neoplasia type II.   Last BMI/Weight/Height recorded Estimated body mass index is 43.22 kg/m as calculated from the following:   Height as of 02/25/24: 5' 3 (1.6 m).   Weight as of 03/04/24: 244 lb (110.7 kg).  Jennifer SHAUNNA Nett, MD

## 2024-03-09 NOTE — Telephone Encounter (Signed)
 Requested medication (s) are due for refill today: Yes  Requested medication (s) are on the active medication list: Yes  Last refill:  03/08/24  Future visit scheduled: Yes  Notes to clinic:  Unable to refill per protocol due to pharmacy request for an alternative medication.      Requested Prescriptions  Pending Prescriptions Disp Refills   WEGOVY 0.25 MG/0.5ML SOAJ [Pharmacy Med Name: WEGOVY 0.25 MG/0.5 ML PEN]  0    Sig: INJECT 0.25MG  INTO THE SKIN ONE TIME PER WEEK     Endocrinology:  Diabetes - GLP-1 Receptor Agonists - semaglutide Failed - 03/09/2024  9:38 PM      Failed - HBA1C in normal range and within 180 days    Hgb A1c MFr Bld  Date Value Ref Range Status  02/25/2024 5.8 (H) 4.8 - 5.6 % Final    Comment:             Prediabetes: 5.7 - 6.4          Diabetes: >6.4          Glycemic control for adults with diabetes: <7.0          Passed - Cr in normal range and within 360 days    Creatinine  Date Value Ref Range Status  05/29/2023 0.65 0.44 - 1.00 mg/dL Final   Creat  Date Value Ref Range Status  07/30/2016 0.74 0.50 - 1.10 mg/dL Final   Creatinine, Ser  Date Value Ref Range Status  02/25/2024 0.63 0.57 - 1.00 mg/dL Final         Passed - Valid encounter within last 6 months    Recent Outpatient Visits           5 days ago Annual physical exam   [place] [facility] [place] Hadassah SQUIBB, MD   1 week ago BMI 87   [place] [facility] [place] Hadassah SQUIBB, MD       Future Appointments             In 4 months Hester Alm BROCKS, MD [facility]

## 2024-03-10 ENCOUNTER — Other Ambulatory Visit (HOSPITAL_COMMUNITY): Payer: Self-pay

## 2024-03-14 ENCOUNTER — Other Ambulatory Visit (HOSPITAL_COMMUNITY): Payer: Self-pay

## 2024-03-14 ENCOUNTER — Telehealth: Payer: Self-pay

## 2024-03-14 NOTE — Telephone Encounter (Signed)
 Pharmacy Patient Advocate Encounter   Received notification from Physician's Office that prior authorization for Wegovy  0.25mg /0.45ml Pen is required/requested.   Insurance verification completed.   The patient is insured through [employer] .   Per test claim: Medication is not covered due to plan/benefit exclusion

## 2024-05-04 ENCOUNTER — Other Ambulatory Visit: Payer: Self-pay | Admitting: Hematology and Oncology

## 2024-06-10 ENCOUNTER — Ambulatory Visit: Admitting: Pediatrics

## 2024-06-10 DIAGNOSIS — R748 Abnormal levels of other serum enzymes: Secondary | ICD-10-CM

## 2024-06-10 DIAGNOSIS — R7303 Prediabetes: Secondary | ICD-10-CM | POA: Diagnosis not present

## 2024-06-10 DIAGNOSIS — R030 Elevated blood-pressure reading, without diagnosis of hypertension: Secondary | ICD-10-CM

## 2024-06-10 DIAGNOSIS — Z124 Encounter for screening for malignant neoplasm of cervix: Secondary | ICD-10-CM

## 2024-06-10 NOTE — Patient Instructions (Addendum)
 Weight medications: Injectable medications: GLP1 agonist like ozempic , wegovy , zepbound, mounjaro  Once weekly injections Most common side effects: nausea, diarrhea, abdominal pain, constipation, acid reflux Higher risk for pancreatitis (inflammation of pancreas) - if developed this while on the medication would stop it Contraindications: history of medullary thyroid  cancer, MEN2 disorders, pregnancy Depending on which injection, can see between 14-26% body weight loss  Oral medications - can do single pill or combined as below Welbutrin/naltrexone SE: headaches, insomnia, increased anxiety 8-10% weight loss Phentermine/topiramate SE: headaches, insomnia, increased anxiety 8-10% weight loss With topiramate: nausea is most common Orlistat SE: greasy stools, abdominal pain 2% weight loss  Wegovy  self pay $499/mo through [employer] (manufacturer) - image below, shipped to you or picked up locally Self pay zepbound (tirzepatide) for them it's $329/mo 1st month then $499/mo thereafter - shipped to you There's compounding pharmacy programs like [facility] where you meet with a provider and they can prescribe compounded semaglutide  or tirzepatide that's shipped to you. I have a couple patients who use that service and they pay - attached image from their website.     Compounded company:    Measure your blood pressure at home! Home Blood Pressure Monitoring is an important part of managing blood pressure and thought to be more accurate than the measures we get in the clinic.  Here's some tips on how to take your blood pressure accurately at home and some highly rated monitors. Most insurances (except for Medicaid) won't pay for monitors, so unfortunately they are an out-of-pocket expense for most people.  Taking an accurate blood pressure measurement: To get an accurate blood pressure reading, empty your bladder first, then rest in a seated position for at least 5 minutes. Ideally, no caffeine or  tobacco use in last 30 minutes. Use an arm cuff (not wrist - see recommendations below) seated in a chair with a back next to a table or object that is high enough that you can rest your arm so the blood pressure cuff is at the level of your heart and you can lean back comfortably. Keep both feet on the floor and don't talk while the machine is working. Checking at different times of the day can be helpful to get an idea of your average numbers. Your goal blood pressure should be <140/90.

## 2024-06-10 NOTE — Progress Notes (Signed)
 Office Visit  BP (!) 149/86   Pulse 74   Temp 98 F (36.7 C) (Oral)   Wt 245 lb 6.4 oz (111.3 kg)   LMP 07/14/2017 Comment: was on BCP until 09-2018  SpO2 97%   BMI 43.47 kg/m    Subjective:    Patient ID: Jennifer Harrison, female    DOB: [birth year], 58 y.o.   MRN: [id number]  HPI: Jennifer Harrison is a 58 y.o. female  Chief Complaint  Patient presents with   Follow-up    Discussed the use of AI scribe software for clinical note transcription with the patient, who gave verbal consent to proceed.  History of Present Illness   Jennifer Harrison is a 58 year old female who presents for follow-up regarding weight management and elevated liver enzymes.  She is exploring various medication options for weight loss, including injectable and oral medications. She has not previously used Wellbutrin or phentermine, which are potential oral options. She is interested in exploring insurance coverage for medications like Wegovy , which she learned is now approved for nonalcoholic fatty liver disease.  Her blood pressure was slightly elevated during today's visit. She mentions that her blood pressure typically rises during doctor visits but normalizes when checked manually at the cancer center. She has a blood pressure cuff at home and will monitor her readings, especially if they reach the 140s/90s range.  Her past medical history includes borderline elevated A1C and cholesterol levels, which were noted in previous lab results. She is considering having these labs rechecked.  She recalls having a Pap smear in 2023 but did not have a checkup in 2024 due to her doctor's retirement. She is due for a Pap smear this year.        Relevant past medical, surgical, family and social history reviewed and updated as indicated. Interim medical history since our last visit reviewed. Allergies and medications reviewed and updated.  ROS per HPI unless specifically indicated  above     Objective:    BP (!) 149/86   Pulse 74   Temp 98 F (36.7 C) (Oral)   Wt 245 lb 6.4 oz (111.3 kg)   LMP 07/14/2017 Comment: was on BCP until 09-2018  SpO2 97%   BMI 43.47 kg/m   Wt Readings from Last 3 Encounters:  06/10/24 245 lb 6.4 oz (111.3 kg)  03/04/24 244 lb (110.7 kg)  02/25/24 243 lb (110.2 kg)     Physical Exam Exam conducted with a chaperone present.  Constitutional:      Appearance: Normal appearance.  Pulmonary:     Effort: Pulmonary effort is normal.  Genitourinary:    General: Normal vulva.     Exam position: Lithotomy position.     Vagina: Normal.     Cervix: Normal.  Musculoskeletal:        General: Normal range of motion.  Skin:    Comments: Normal skin color  Neurological:     General: No focal deficit present.     Mental Status: She is alert. Mental status is at baseline.  Psychiatric:        Mood and Affect: Mood normal.        Behavior: Behavior normal.        Thought Content: Thought content normal.         06/10/2024    2:08 PM 03/04/2024    2:45 PM 02/25/2024    3:19 PM  Depression screen PHQ 2/9  Decreased Interest 0 0 0  Down, Depressed, Hopeless 0 0 0  PHQ - 2 Score 0 0 0  Altered sleeping 0 1 1  Tired, decreased energy 1 1 0  Change in appetite 0 0 0  Feeling bad or failure about yourself  0 0 0  Trouble concentrating 1 0 0  Moving slowly or fidgety/restless 0 0 0  Suicidal thoughts 0 0 0  PHQ-9 Score 2 2 1   Difficult doing work/chores Somewhat difficult Somewhat difficult Not difficult at all       06/10/2024    2:08 PM 03/04/2024    2:45 PM 02/25/2024    3:19 PM  GAD 7 : Generalized Anxiety Score  Nervous, Anxious, on Edge 0 0 1  Control/stop worrying 0 0 0  Worry too much - different things 0 0 0  Trouble relaxing 1 0 0  Restless 0 1 0  Easily annoyed or irritable 1 1 2   Afraid - awful might happen 0 0 0  Total GAD 7 Score 2 2 3   Anxiety Difficulty Somewhat difficult Somewhat difficult Somewhat difficult        Assessment & Plan:  Assessment & Plan   BMI 43 Assessment & Plan: Discussed weight management options, including medications. Addressed insurance coverage issues. Oral medications have side effects. Injectable medications offer higher weight loss but face coverage challenges. Self-pay and compounded options are alternatives. - Consider self-pay programs and compounded medications if insurance does not cover.   Elevated liver enzymes Previous test showed slight elevation. Discussed potential NAFLD. Wegovy  may aid in weight loss and liver health if covered for NAFLD. - Order liver panel to assess liver enzyme levels. - Consider Wegovy  if liver panel indicates NAFLD. -     Comprehensive metabolic panel with GFR -     FIB-4 W/REFLEX TO ELF  Prediabetes Due for repeat levels. -     Lipid panel -     Hemoglobin A1c  Elevated blood pressure reading Blood pressure slightly elevated. Monitoring recommended. Weight loss may help reduce blood pressure. - Monitor blood pressure at home. - Report if blood pressure consistently reaches 140/90 mmHg or higher.  Screening for cervical cancer -     Cytology - PAP    Follow up plan: No follow-ups on file.  Hadassah SHAUNNA Nett, MD

## 2024-06-11 LAB — COMPREHENSIVE METABOLIC PANEL WITH GFR
ALT: 29 IU/L (ref 0–32)
AST: 24 IU/L (ref 0–40)
Albumin: 4.8 g/dL (ref 3.8–4.9)
Alkaline Phosphatase: 68 IU/L (ref 49–135)
BUN/Creatinine Ratio: 24 — ABNORMAL HIGH (ref 9–23)
BUN: 16 mg/dL (ref 6–24)
Bilirubin Total: 0.2 mg/dL (ref 0.0–1.2)
CO2: 25 mmol/L (ref 20–29)
Calcium: 10 mg/dL (ref 8.7–10.2)
Chloride: 98 mmol/L (ref 96–106)
Creatinine, Ser: 0.68 mg/dL (ref 0.57–1.00)
Globulin, Total: 2.3 g/dL (ref 1.5–4.5)
Glucose: 71 mg/dL (ref 70–99)
Potassium: 4 mmol/L (ref 3.5–5.2)
Sodium: 140 mmol/L (ref 134–144)
Total Protein: 7.1 g/dL (ref 6.0–8.5)
eGFR: 102 mL/min/1.73 (ref >59)

## 2024-06-11 LAB — HEMOGLOBIN A1C
Est. average glucose Bld gHb Est-mCnc: 117 mg/dL
Hgb A1c MFr Bld: 5.7 % — ABNORMAL HIGH (ref 4.8–5.6)

## 2024-06-11 LAB — LIPID PANEL
Chol/HDL Ratio: 4.3 ratio (ref 0.0–4.4)
Cholesterol, Total: 200 mg/dL — ABNORMAL HIGH (ref 100–199)
HDL: 47 mg/dL (ref >39)
LDL Chol Calc (NIH): 128 mg/dL — ABNORMAL HIGH (ref 0–99)
Triglycerides: 141 mg/dL (ref 0–149)
VLDL Cholesterol Cal: 25 mg/dL (ref 5–40)

## 2024-06-11 LAB — FIB-4 W/REFLEX TO ELF
FIB-4 Index: 0.75 (ref 0.00–2.67)
Platelets: 334 x10E3/uL (ref 150–450)

## 2024-06-13 ENCOUNTER — Encounter: Payer: Self-pay | Admitting: Pediatrics

## 2024-06-13 NOTE — Assessment & Plan Note (Signed)
 Discussed weight management options, including medications. Addressed insurance coverage issues. Oral medications have side effects. Injectable medications offer higher weight loss but face coverage challenges. Self-pay and compounded options are alternatives. - Consider self-pay programs and compounded medications if insurance does not cover.

## 2024-06-15 ENCOUNTER — Ambulatory Visit: Payer: Self-pay | Admitting: Pediatrics

## 2024-06-15 ENCOUNTER — Other Ambulatory Visit: Payer: Self-pay | Admitting: Pediatrics

## 2024-06-15 DIAGNOSIS — M549 Dorsalgia, unspecified: Secondary | ICD-10-CM

## 2024-06-15 MED ORDER — MELOXICAM 15 MG PO TABS: 15.0000 mg | ORAL_TABLET | Freq: Every day | ORAL | 2 refills | Status: AC

## 2024-06-15 NOTE — Progress Notes (Signed)
 Sent meloxicam for back pain per mychart request  Hadassah SHAUNNA Nett, MD

## 2024-06-20 LAB — CYTOLOGY - PAP: Adequacy: ABNORMAL

## 2024-06-22 NOTE — Telephone Encounter (Unsigned)
 Copied from CRM [id number]. Topic: Clinical - Medication Question >> Jun 21, 2024  4:56 PM Selinda RAMAN wrote: Reason for CRM: The patient called in stating she left a my chart message last week and she still hasn't heard back from her provider. She does want to go ahead with the Wellbutrin and that along with intermittent fasting is what she would like to do. If a prescription can be called into her pharmacy  [id number] [place], [place] - [address]  Phone: [phone] Fax: [phone]   Please assist patient further

## 2024-06-23 ENCOUNTER — Other Ambulatory Visit: Payer: Self-pay | Admitting: Pediatrics

## 2024-06-23 MED ORDER — BUPROPION HCL 75 MG PO TABS: ORAL_TABLET | ORAL | 0 refills | Status: AC

## 2024-06-23 NOTE — Progress Notes (Signed)
 Messaged to start welbutrin for wt loss. Sent order.  Hadassah SHAUNNA Nett, MD

## 2024-06-27 ENCOUNTER — Ambulatory Visit: Payer: Self-pay

## 2024-06-27 NOTE — Telephone Encounter (Signed)
 FYI Only or Action Required?: FYI only for provider.  Patient was last seen in primary care on 06/10/2024 by Jennifer Hadassah SQUIBB, MD.  Called Nurse Triage reporting URI.  Symptoms began several days ago.  Interventions attempted: OTC medications: mucinex.  Symptoms are: gradually worsening.  Triage Disposition: See Today or Tomorrow in Office (overriding Home Care)  Patient/caregiver understands and will follow disposition?: Yes Reason for Disposition  [1] Sinus congestion as part of a cold AND [2] present < 10 days  Answer Assessment - Initial Assessment Questions Pt reports productive cough with thick yellow sputum, nasal congestion, sinus pressure x 3 days. Denies fever. Taking mucinex. Has not taken covid test.  1. LOCATION: Where does it hurt?      Sinus pressure, mild HA and sore throat  2. ONSET: When did the sinus pain start?  (e.g., hours, days)      3 days  3. SEVERITY: How bad is the pain?   (Scale 0-10; or none, mild, moderate or severe)     Moderate  4. FEVER: Do you have a fever? If Yes, ask: What is it, how was it measured, and when did it start?      Denies  5. OTHER SYMPTOMS: Do you have any other symptoms? (e.g., sore throat, cough, earache, difficulty breathing)     Congestion, cough  Protocols used: Sinus Pain or Congestion-A-AH Copied from CRM #[id number]. Topic: Clinical - Red Word Triage >> Jun 27, 2024  9:15 AM [place] A wrote: [facility] that prompted transfer to Nurse Triage: Patient said she has Sore Throat, coughing.congestion in head and drainage.

## 2024-06-28 ENCOUNTER — Encounter: Payer: Self-pay | Admitting: Nurse Practitioner

## 2024-06-28 ENCOUNTER — Ambulatory Visit: Admitting: Nurse Practitioner

## 2024-06-28 ENCOUNTER — Ambulatory Visit: Payer: Self-pay | Admitting: Nurse Practitioner

## 2024-06-28 VITALS — BP 156/93 | HR 76 | Temp 98.4°F | Ht 63.0 in | Wt 246.0 lb

## 2024-06-28 DIAGNOSIS — R0981 Nasal congestion: Secondary | ICD-10-CM

## 2024-06-28 LAB — VERITOR FLU A/B WAIVED
Influenza A: NEGATIVE
Influenza B: NEGATIVE

## 2024-06-28 MED ORDER — METHYLPREDNISOLONE 4 MG PO TBPK: ORAL_TABLET | ORAL | 0 refills | Status: AC

## 2024-06-28 MED ORDER — IPRATROPIUM BROMIDE 0.03 % NA SOLN: 2.0000 | Freq: Two times a day (BID) | NASAL | 0 refills | Status: AC

## 2024-06-28 NOTE — Progress Notes (Signed)
 BP (!) 156/93   Pulse 76   Temp 98.4 F (36.9 C) (Oral)   Ht 5' 3 (1.6 m)   Wt 246 lb (111.6 kg)   LMP 07/14/2017 Comment: was on BCP until 09-2018  SpO2 97%   BMI 43.58 kg/m    Subjective:    Patient ID: Jennifer Harrison, female    DOB: [birth year], 58 y.o.   MRN: [id number]  NOTE WRITTEN BY DNP STUDENT.  ASSESSMENT AND PLAN OF CARE REVIEWED WITH STUDENT, AGREE WITH ABOVE FINDINGS AND PLAN.   Chief Complaint  Patient presents with   Nasal Congestion   Cough    Onset Saturday. Denies fever    Sinusitis   HPI: Jennifer Harrison is a 58 y.o. female presenting in clinic today for an acute visit for sore throat that started Saturday and has been progressively worsening with congestion, productive cough, facial pain and pressure. Pt denies headache, shortness of breathe, chest pain, nausea, or vomiting. Denies ear pain, eye redness or drainage. Pt's boyfriend was recently sick with similar symptoms but did not test for covid/flu/strep.   UPPER RESPIRATORY TRACT INFECTION Worst symptom: Fever: no Cough: yes Shortness of breath: no Wheezing: no Chest pain: no Chest tightness: no Chest congestion: no Nasal congestion: yes Runny nose: yes Post nasal drip: yes Sneezing: no Sore throat: yes Swollen glands: no Sinus pressure: yes Headache: no Face pain: yes Toothache: no Ear pain: no bilateral Ear pressure: no bilateral Eyes red/itching:no Eye drainage/crusting: no  Vomiting: no Rash: no Fatigue: yes Sick contacts: yes Strep contacts: no  Context: worse Recurrent sinusitis: no Relief with OTC cold/cough medications: yes  Treatments attempted: mucinex   Relevant past medical, surgical, family and social history reviewed and updated as indicated. Interim medical history since our last visit reviewed. Allergies and medications reviewed and updated.  Review of Systems  Constitutional:  Negative for chills and fever.  HENT:  Positive for congestion,  postnasal drip, rhinorrhea, sinus pressure, sinus pain and sore throat. Negative for ear discharge, ear pain and sneezing.   Eyes:  Negative for pain, discharge, redness and itching.  Respiratory:  Positive for cough. Negative for chest tightness, shortness of breath and wheezing.   Cardiovascular:  Negative for chest pain and palpitations.  Gastrointestinal:  Negative for abdominal pain, diarrhea and nausea.  Skin:  Negative for rash.  Neurological:  Negative for dizziness, weakness, light-headedness and headaches.    Per HPI unless specifically indicated above     Objective:    BP (!) 156/93   Pulse 76   Temp 98.4 F (36.9 C) (Oral)   Ht 5' 3 (1.6 m)   Wt 246 lb (111.6 kg)   LMP 07/14/2017 Comment: was on BCP until 09-2018  SpO2 97%   BMI 43.58 kg/m   Wt Readings from Last 3 Encounters:  06/28/24 246 lb (111.6 kg)  06/10/24 245 lb 6.4 oz (111.3 kg)  03/04/24 244 lb (110.7 kg)    Physical Exam Vitals and nursing note reviewed.  Constitutional:      General: She is not in acute distress.    Appearance: Normal appearance. She is not ill-appearing, toxic-appearing or diaphoretic.  HENT:     Head: Normocephalic.     Right Ear: Tympanic membrane and external ear normal.     Left Ear: Tympanic membrane and external ear normal.     Nose: Congestion and rhinorrhea present.     Mouth/Throat:     Mouth: Mucous membranes are moist.  Pharynx: Posterior oropharyngeal erythema present. No oropharyngeal exudate.  Eyes:     General:        Right eye: No discharge.        Left eye: No discharge.     Extraocular Movements: Extraocular movements intact.     Conjunctiva/sclera: Conjunctivae normal.     Pupils: Pupils are equal, round, and reactive to light.  Cardiovascular:     Rate and Rhythm: Normal rate and regular rhythm.     Pulses: Normal pulses.     Heart sounds: Normal heart sounds. No murmur heard. Pulmonary:     Effort: Pulmonary effort is normal. No respiratory  distress.     Breath sounds: Normal breath sounds. No wheezing, rhonchi or rales.  Abdominal:     General: Abdomen is flat. Bowel sounds are normal.     Palpations: Abdomen is soft.  Musculoskeletal:     Cervical back: Normal range of motion and neck supple.  Lymphadenopathy:     Cervical: No cervical adenopathy.  Skin:    General: Skin is warm and dry.     Capillary Refill: Capillary refill takes less than 2 seconds.  Neurological:     General: No focal deficit present.     Mental Status: She is alert and oriented to person, place, and time.  Psychiatric:        Mood and Affect: Mood normal.        Behavior: Behavior normal.        Thought Content: Thought content normal.        Judgment: Judgment normal.     Results for orders placed or performed in visit on 06/10/24  Cytology - PAP   Collection Time: 06/10/24  2:31 PM  Result Value Ref Range   High risk HPV Other    Adequacy      UNSATISFACTORY for evaluation due to extremely scant cellularity. The   Adequacy      specimen is processed and examined microscopically, but is found to be   Adequacy      unsatisfactory for evaluation of an epithelial abnormality. Repeat study   Adequacy recommended.    Diagnosis - Non-diagnostic (A)    Molecular Comment Recommend recollection and testing.   Comp Met (CMET)   Collection Time: 06/10/24  2:44 PM  Result Value Ref Range   Glucose 71 70 - 99 mg/dL   BUN 16 6 - 24 mg/dL   Creatinine, Ser 9.31 0.57 - 1.00 mg/dL   eGFR 897 >40 fO/fpw/8.26   BUN/Creatinine Ratio 24 (H) 9 - 23   Sodium 140 134 - 144 mmol/L   Potassium 4.0 3.5 - 5.2 mmol/L   Chloride 98 96 - 106 mmol/L   CO2 25 20 - 29 mmol/L   Calcium 10.0 8.7 - 10.2 mg/dL   Total Protein 7.1 6.0 - 8.5 g/dL   Albumin 4.8 3.8 - 4.9 g/dL   Globulin, Total 2.3 1.5 - 4.5 g/dL   Bilirubin Total 0.2 0.0 - 1.2 mg/dL   Alkaline Phosphatase 68 49 - 135 IU/L   AST 24 0 - 40 IU/L   ALT 29 0 - 32 IU/L  Lipid Profile   Collection  Time: 06/10/24  2:44 PM  Result Value Ref Range   Cholesterol, Total 200 (H) 100 - 199 mg/dL   Triglycerides 858 0 - 149 mg/dL   HDL 47 >60 mg/dL   VLDL Cholesterol Cal 25 5 - 40 mg/dL   LDL Chol Calc (NIH) 871 (H) 0 -  99 mg/dL   Chol/HDL Ratio 4.3 0.0 - 4.4 ratio  HgB A1c   Collection Time: 06/10/24  2:44 PM  Result Value Ref Range   Hgb A1c MFr Bld 5.7 (H) 4.8 - 5.6 %   Est. average glucose Bld gHb Est-mCnc 117 mg/dL  FIB-4 W/REFLEX TO ELF   Collection Time: 06/10/24  2:44 PM  Result Value Ref Range   Platelets 334 150 - 450 x10E3/uL   FIB-4 Index 0.75 0.00 - 2.67      Assessment & Plan:   Problem List Items Addressed This Visit   None Visit Diagnoses       Congestion of nasal sinus    -  Primary   Steroid and nasal spray prescribed. Take as directed. Rest, drink plenty of fluids. Follow-up as needed if symptoms worsen or do not resolve.   Relevant Orders   Influenza A & B (STAT)   Novel Coronavirus, NAA ([facility])        Follow up plan: No follow-ups on file.

## 2024-06-30 LAB — NOVEL CORONAVIRUS, NAA: SARS-CoV-2, NAA: NOT DETECTED

## 2024-07-11 ENCOUNTER — Ambulatory Visit (INDEPENDENT_AMBULATORY_CARE_PROVIDER_SITE_OTHER): Admitting: Dermatology

## 2024-07-11 DIAGNOSIS — D1801 Hemangioma of skin and subcutaneous tissue: Secondary | ICD-10-CM

## 2024-07-11 DIAGNOSIS — Z1283 Encounter for screening for malignant neoplasm of skin: Secondary | ICD-10-CM

## 2024-07-11 DIAGNOSIS — W908XXA Exposure to other nonionizing radiation, initial encounter: Secondary | ICD-10-CM

## 2024-07-11 DIAGNOSIS — Z853 Personal history of malignant neoplasm of breast: Secondary | ICD-10-CM

## 2024-07-11 DIAGNOSIS — L578 Other skin changes due to chronic exposure to nonionizing radiation: Secondary | ICD-10-CM

## 2024-07-11 DIAGNOSIS — L814 Other melanin hyperpigmentation: Secondary | ICD-10-CM

## 2024-07-11 DIAGNOSIS — D229 Melanocytic nevi, unspecified: Secondary | ICD-10-CM

## 2024-07-11 DIAGNOSIS — L821 Other seborrheic keratosis: Secondary | ICD-10-CM

## 2024-07-11 DIAGNOSIS — Z85828 Personal history of other malignant neoplasm of skin: Secondary | ICD-10-CM

## 2024-07-11 NOTE — Progress Notes (Unsigned)
   Follow-Up Visit   Subjective  Jennifer Harrison is a 58 y.o. female who presents for the following: Skin Cancer Screening and Full Body Skin Exam Hx of bcc, hx of akshx of isks   The patient presents for Total-Body Skin Exam (TBSE) for skin cancer screening and mole check. The patient has spots, moles and lesions to be evaluated, some may be new or changing and the patient may have concern these could be cancer.  The following portions of the chart were reviewed this encounter and updated as appropriate: medications, allergies, medical history  Review of Systems:  No other skin or systemic complaints except as noted in HPI or Assessment and Plan.  Objective  Well appearing patient in no apparent distress; mood and affect are within normal limits.  A full examination was performed including scalp, head, eyes, ears, nose, lips, neck, chest, axillae, abdomen, back, buttocks, bilateral upper extremities, bilateral lower extremities, hands, feet, fingers, toes, fingernails, and toenails. All findings within normal limits unless otherwise noted below.   Relevant physical exam findings are noted in the Assessment and Plan.  Assessment & Plan   SKIN CANCER SCREENING PERFORMED TODAY.  HISTORY OF BASAL CELL CARCINOMA OF THE SKIN - No evidence of recurrence today - 4 / 15 / 2021 left ant nasal ala crease trx with ED&C - Recommend regular full body skin exams - Recommend daily broad spectrum sunscreen SPF 30+ to sun-exposed areas, reapply every 2 hours as needed.  - Call if any new or changing lesions are noted between office visits  HISTORY OF Breast Cancer in left breast  January 2020  - no lymphadenopathy  - Clear. Observe for recurrence.  - Call clinic for new or changing lesions.   - Recommend regular skin exams, daily broad-spectrum spf 30+ sunscreen use, and photoprotection.     ACTINIC DAMAGE - Chronic condition, secondary to cumulative UV/sun exposure - diffuse scaly  erythematous macules with underlying dyspigmentation - Recommend daily broad spectrum sunscreen SPF 30+ to sun-exposed areas, reapply every 2 hours as needed.  - Staying in the shade or wearing long sleeves, sun glasses (UVA+UVB protection) and wide brim hats (4-inch brim around the entire circumference of the hat) are also recommended for sun protection.  - Call for new or changing lesions.  LENTIGINES, SEBORRHEIC KERATOSES, HEMANGIOMAS - Benign normal skin lesions - Benign-appearing - Call for any changes  MELANOCYTIC NEVI - Tan-brown and/or pink-flesh-colored symmetric macules and papules - Benign appearing on exam today - Observation - Call clinic for new or changing moles - Recommend daily use of broad spectrum spf 30+ sunscreen to sun-exposed areas.    Return in about 1 year (around 07/11/2025) for TBSE.  IEleanor Harrison, CMA, am acting as scribe for Jennifer Rhyme, MD.   Documentation: I have reviewed the above documentation for accuracy and completeness, and I agree with the above.  Jennifer Rhyme, MD

## 2024-07-11 NOTE — Patient Instructions (Addendum)
 Seborrheic Keratosis  What causes seborrheic keratoses? Seborrheic keratoses are harmless, common skin growths that first appear during adult life.  As time goes by, more growths appear.  Some people may develop a large number of them.  Seborrheic keratoses appear on both covered and uncovered body parts.  They are not caused by sunlight.  The tendency to develop seborrheic keratoses can be inherited.  They vary in color from skin-colored to gray, brown, or even black.  They can be either smooth or have a rough, warty surface.   Seborrheic keratoses are superficial and look as if they were stuck on the skin.  Under the microscope this type of keratosis looks like layers upon layers of skin.  That is why at times the top layer may seem to fall off, but the rest of the growth remains and re-grows.    Treatment Seborrheic keratoses do not need to be treated, but can easily be removed in the office.  Seborrheic keratoses often cause symptoms when they rub on clothing or jewelry.  Lesions can be in the way of shaving.  If they become inflamed, they can cause itching, soreness, or burning.  Removal of a seborrheic keratosis can be accomplished by freezing, burning, or surgery. If any spot bleeds, scabs, or grows rapidly, please return to have it checked, as these can be an indication of a skin cancer.   Melanoma ABCDEs  Melanoma is the most dangerous type of skin cancer, and is the leading cause of death from skin disease.  You are more likely to develop melanoma if you: Have light-colored skin, light-colored eyes, or red or blond hair Spend a lot of time in the sun Tan regularly, either outdoors or in a tanning bed Have had blistering sunburns, especially during childhood Have a close family member who has had a melanoma Have atypical moles or large birthmarks  Early detection of melanoma is key since treatment is typically straightforward and cure rates are extremely high if we catch it early.    The first sign of melanoma is often a change in a mole or a new dark spot.  The ABCDE system is a way of remembering the signs of melanoma.  A for asymmetry:  The two halves do not match. B for border:  The edges of the growth are irregular. C for color:  A mixture of colors are present instead of an even brown color. D for diameter:  Melanomas are usually (but not always) greater than 6mm - the size of a pencil eraser. E for evolution:  The spot keeps changing in size, shape, and color.  Please check your skin once per month between visits. You can use a small mirror in front and a large mirror behind you to keep an eye on the back side or your body.   If you see any new or changing lesions before your next follow-up, please call to schedule a visit.  Please continue daily skin protection including broad spectrum sunscreen SPF 30+ to sun-exposed areas, reapplying every 2 hours as needed when you're outdoors.   Staying in the shade or wearing long sleeves, sun glasses (UVA+UVB protection) and wide brim hats (4-inch brim around the entire circumference of the hat) are also recommended for sun protection.    Due to recent changes in healthcare laws, you may see results of your pathology and/or laboratory studies on MyChart before the doctors have had a chance to review them. We understand that in some cases there may  be results that are confusing or concerning to you. Please understand that not all results are received at the same time and often the doctors may need to interpret multiple results in order to provide you with the best plan of care or course of treatment. Therefore, we ask that you please give us  2 business days to thoroughly review all your results before contacting the office for clarification. Should we see a critical lab result, you will be contacted sooner.   If You Need Anything After Your Visit  If you have any questions or concerns for your doctor, please call our main  line at [phone] and press option 4 to reach your doctor's medical assistant. If no one answers, please leave a voicemail as directed and we will return your call as soon as possible. Messages left after 4 pm will be answered the following business day.   You may also send us  a message via MyChart. We typically respond to MyChart messages within 1-2 business days.  For prescription refills, please ask your pharmacy to contact our office. Our fax number is [phone].  If you have an urgent issue when the clinic is closed that cannot wait until the next business day, you can page your doctor at the number below.    Please note that while we do our best to be available for urgent issues outside of office hours, we are not available 24/7.   If you have an urgent issue and are unable to reach us , you may choose to seek medical care at your doctor's office, retail clinic, urgent care center, or emergency room.  If you have a medical emergency, please immediately call 911 or go to the emergency department.  Pager Numbers  - Dr. Hester: [phone]  - Dr. Jackquline: [phone]  - Dr. Claudene: [phone]   - Dr. Raymund: [phone]  In the event of inclement weather, please call our main line at [phone] for an update on the status of any delays or closures.  Dermatology Medication Tips: Please keep the boxes that topical medications come in in order to help keep track of the instructions about where and how to use these. Pharmacies typically print the medication instructions only on the boxes and not directly on the medication tubes.   If your medication is too expensive, please contact our office at [phone] option 4 or send us  a message through MyChart.   We are unable to tell what your co-pay for medications will be in advance as this is different depending on your insurance coverage. However, we may be able to find a substitute medication at lower cost or fill out  paperwork to get insurance to cover a needed medication.   If a prior authorization is required to get your medication covered by your insurance company, please allow us  1-2 business days to complete this process.  Drug prices often vary depending on where the prescription is filled and some pharmacies may offer cheaper prices.  The website www.goodrx.com contains coupons for medications through different pharmacies. The prices here do not account for what the cost may be with help from insurance (it may be cheaper with your insurance), but the website can give you the price if you did not use any insurance.  - You can print the associated coupon and take it with your prescription to the pharmacy.  - You may also stop by our office during regular business hours and pick up a GoodRx coupon card.  - If you need  your prescription sent electronically to a different pharmacy, notify our office through [facility] or by phone at [phone] option 4.     Si Usted Necesita Algo Despus de Su Visita  Tambin puede enviarnos un mensaje a travs de Clinical cytogeneticist. Por lo general respondemos a los mensajes de MyChart en el transcurso de 1 a 2 das hbiles.  Para renovar recetas, por favor pida a su farmacia que se ponga en contacto con nuestra oficina. Randi lakes de fax es [place] [phone].  Si tiene un asunto urgente cuando la clnica est cerrada y que no puede esperar hasta el siguiente da hbil, puede llamar/localizar a su doctor(a) al nmero que aparece a continuacin.   Por favor, tenga en cuenta que aunque hacemos todo lo posible para estar disponibles para asuntos urgentes fuera del horario de [place], no estamos disponibles las 24 horas del da, los 7 [address]  [address] de la [place].   Si tiene un problema urgente y no puede comunicarse con nosotros, puede optar por buscar atencin mdica  en el consultorio de su doctor(a), en una clnica privada, en un centro de atencin urgente o en una sala de  emergencias.  Si tiene Engineer, drilling, por favor llame inmediatamente al 911 o vaya a la sala de emergencias.  Nmeros de bper  - Dr. Hester: [phone]  - Dra. Jackquline: [phone]  - Dr. Claudene: [phone]  - Dra. Kitts: [phone]  En caso de inclemencias del [place], por favor llame a nuestra lnea principal al [phone] para una actualizacin sobre el estado de cualquier retraso o cierre.  Consejos para la medicacin en dermatologa: Por favor, guarde las cajas en las que vienen los medicamentos de uso tpico para ayudarle a seguir las instrucciones sobre dnde y cmo usarlos. Las farmacias generalmente imprimen las instrucciones del medicamento slo en las cajas y no directamente en los tubos del [place].   Si su medicamento es muy caro, por favor, pngase en contacto con landry rieger llamando al [phone] y presione la opcin 4 o envenos un mensaje a travs de Clinical cytogeneticist.   No podemos decirle cul ser su copago por los medicamentos por adelantado ya que esto es diferente dependiendo de la cobertura de su seguro. Sin embargo, es posible que podamos encontrar un medicamento sustituto a Audiological scientist un formulario para que el seguro cubra el medicamento que se considera necesario.   Si se requiere una autorizacin previa para que su compaa de seguros malta su medicamento, por favor permtanos de 1 a 2 das hbiles para completar este proceso.  Los precios de los medicamentos varan con frecuencia dependiendo del Environmental consultant de dnde se surte la receta y alguna farmacias pueden ofrecer precios ms baratos.  El sitio web www.goodrx.com tiene cupones para medicamentos de Health and safety inspector. Los precios aqu no tienen en cuenta lo que podra costar con la ayuda del seguro (puede ser ms barato con su seguro), pero el sitio web puede darle el precio si no utiliz Tourist information centre manager.  - Puede imprimir el cupn correspondiente y llevarlo con su receta a la  farmacia.  - Tambin puede pasar por nuestra oficina durante el horario de atencin regular y Education officer, museum una tarjeta de cupones de GoodRx.  - Si necesita que su receta se enve electrnicamente a una farmacia diferente, informe a nuestra oficina a travs de MyChart de [place] o por telfono llamando al [phone] y presione la opcin 4.

## 2024-07-13 ENCOUNTER — Encounter: Payer: Self-pay | Admitting: Dermatology

## 2024-07-14 ENCOUNTER — Ambulatory Visit: Payer: 59 | Admitting: Dermatology

## 2024-07-20 ENCOUNTER — Other Ambulatory Visit: Payer: Self-pay | Admitting: Pediatrics

## 2024-07-22 NOTE — Telephone Encounter (Signed)
 Requested medications are due for refill today.  unsure  Requested medications are on the active medications list.  yes  Last refill. 06/23/2024 #42  Future visit scheduled.   no  Notes to clinic.  Rx written to expire 07/21/2024 - rx is expired.    Requested Prescriptions  Pending Prescriptions Disp Refills   buPROPion (WELLBUTRIN) 75 MG tablet [Pharmacy Med Name: BUPROPION HCL 75 MG TABLET] 126 tablet 1    Sig: Take 1 tablet (75 mg total) by mouth daily for 14 days, THEN 1 tablet (75 mg total) 2 (two) times daily for 14 days.     Psychiatry: Antidepressants - bupropion Failed - 07/22/2024  1:29 PM      Failed - Last BP in normal range    BP Readings from Last 1 Encounters:  06/28/24 (!) 156/93         Passed - Cr in normal range and within 360 days    Creatinine  Date Value Ref Range Status  05/29/2023 0.65 0.44 - 1.00 mg/dL Final   Creat  Date Value Ref Range Status  07/30/2016 0.74 0.50 - 1.10 mg/dL Final   Creatinine, Ser  Date Value Ref Range Status  06/10/2024 0.68 0.57 - 1.00 mg/dL Final         Passed - AST in normal range and within 360 days    AST  Date Value Ref Range Status  06/10/2024 24 0 - 40 IU/L Final  05/29/2023 24 15 - 41 U/L Final         Passed - ALT in normal range and within 360 days    ALT  Date Value Ref Range Status  06/10/2024 29 0 - 32 IU/L Final  05/29/2023 42 0 - 44 U/L Final         Passed - Valid encounter within last 6 months    Recent Outpatient Visits           3 weeks ago Congestion of nasal sinus   [place] [facility] Melvin Pao, NP   1 month ago BMI 74   [place] [facility] Herold Hadassah SQUIBB, MD   4 months ago Annual physical exam   [place] [facility] Herold Hadassah SQUIBB, MD   4 months ago BMI 65   [place] [facility] Herold Hadassah SQUIBB, MD       Future Appointments             In 11 months Hester Alm BROCKS, MD [facility]

## 2024-07-24 ENCOUNTER — Other Ambulatory Visit: Payer: Self-pay | Admitting: Nurse Practitioner

## 2024-07-25 ENCOUNTER — Encounter: Payer: Self-pay | Admitting: Hematology and Oncology

## 2024-07-26 NOTE — Telephone Encounter (Signed)
 Requested medication (s) are due for refill today - unsure  Requested medication (s) are on the active medication list -yes  Future visit scheduled -no  Last refill: 06/28/24 30ml  Notes to clinic: Acute visit Rx- is this something to continue?- sent for PCP review   Requested Prescriptions  Pending Prescriptions Disp Refills   ipratropium (ATROVENT) 0.03 % nasal spray [Pharmacy Med Name: IPRATROPIUM 0.03% SPRAY]      Sig: Place 2 sprays into both nostrils every 12 (twelve) hours for 3 days.     Off-Protocol Failed - 07/26/2024  3:11 PM      Failed - Medication not assigned to a protocol, review manually.      Passed - Valid encounter within last 12 months    Recent Outpatient Visits           4 weeks ago Congestion of nasal sinus   [place] [facility] Melvin Pao, NP   1 month ago BMI 52   [place] [facility] Herold Hadassah SQUIBB, MD   4 months ago Annual physical exam   [place] [facility] Herold Hadassah SQUIBB, MD   5 months ago BMI 40   [place] [facility] Herold Hadassah SQUIBB, MD       Future Appointments             In 11 months Hester Alm BROCKS, MD [place] [facility]           Off-Protocol Failed - 07/26/2024  3:11 PM      Failed - Medication not assigned to a protocol, review manually.      Passed - Valid encounter within last 12 months    Recent Outpatient Visits           4 weeks ago Congestion of nasal sinus   [place] [facility] Melvin Pao, NP   1 month ago BMI 70   [place] [facility] Herold Hadassah SQUIBB, MD   4 months ago Annual physical exam   [place] [facility] Herold Hadassah SQUIBB, MD   5 months ago BMI 63   [place] [facility] Herold Hadassah SQUIBB, MD       Future Appointments             In 11 months Hester Alm BROCKS, MD [place] [facility]               Requested Prescriptions   Pending Prescriptions Disp Refills   ipratropium (ATROVENT) 0.03 % nasal spray [Pharmacy Med Name: IPRATROPIUM 0.03% SPRAY]      Sig: Place 2 sprays into both nostrils every 12 (twelve) hours for 3 days.     Off-Protocol Failed - 07/26/2024  3:11 PM      Failed - Medication not assigned to a protocol, review manually.      Passed - Valid encounter within last 12 months    Recent Outpatient Visits           4 weeks ago Congestion of nasal sinus   [place] [facility] Melvin Pao, NP   1 month ago BMI 18   [place] [facility] Herold Hadassah SQUIBB, MD   4 months ago Annual physical exam   [place] [facility] Herold Hadassah SQUIBB, MD   5 months ago BMI 11   [place] [facility] Herold Hadassah SQUIBB, MD  Future Appointments             In 11 months Hester Alm BROCKS, MD [facility]           Off-Protocol Failed - 07/26/2024  3:11 PM      Failed - Medication not assigned to a protocol, review manually.      Passed - Valid encounter within last 12 months    Recent Outpatient Visits           4 weeks ago Congestion of nasal sinus   [place] [facility] Melvin Pao, NP   1 month ago BMI 47   [place] [facility] Herold Hadassah SQUIBB, MD   4 months ago Annual physical exam   [place] [facility] Herold Hadassah SQUIBB, MD   5 months ago BMI 38   [place] [facility] Herold Hadassah SQUIBB, MD       Future Appointments             In 11 months Hester Alm BROCKS, MD [facility]

## 2024-07-27 ENCOUNTER — Other Ambulatory Visit: Payer: Self-pay | Admitting: Pediatrics

## 2024-07-27 MED ORDER — BUPROPION HCL ER (XL) 150 MG PO TB24: 150.0000 mg | ORAL_TABLET | Freq: Every day | ORAL | 1 refills | Status: AC

## 2024-07-27 NOTE — Progress Notes (Signed)
 Sending welbutrin refills, updated dosage to 150mg  once daily.  Hadassah SHAUNNA Nett, MD

## 2024-07-27 NOTE — Telephone Encounter (Signed)
 Refused rx for 75mg , since should be on BID/150mg  today/day. Sent correct rx under separate encounter.  Jennifer SHAUNNA Nett, MD

## 2024-08-03 NOTE — Telephone Encounter (Signed)
 Appt scheduled

## 2024-09-26 ENCOUNTER — Telehealth: Payer: Self-pay

## 2024-09-27 NOTE — Telephone Encounter (Signed)
 On chart review, patient should not be due for refill.

## 2024-09-30 ENCOUNTER — Other Ambulatory Visit: Payer: Self-pay

## 2024-09-30 DIAGNOSIS — M549 Dorsalgia, unspecified: Secondary | ICD-10-CM

## 2024-09-30 NOTE — Telephone Encounter (Signed)
 I called and spoke with the patient, and she stated she is no longer taking this prescription. Please remove for future reference.

## 2024-11-07 ENCOUNTER — Encounter

## 2025-01-03 ENCOUNTER — Ambulatory Visit: Admitting: Nurse Practitioner

## 2025-07-11 ENCOUNTER — Ambulatory Visit: Admitting: Dermatology
